# Patient Record
Sex: Female | Born: 1935 | Race: Black or African American | Hispanic: No | Smoking: Never smoker
Health system: Southern US, Community
[De-identification: ages and names within clinical notes are randomized; demographics above are authoritative.]

## PROBLEM LIST (undated history)

## (undated) DIAGNOSIS — E785 Hyperlipidemia, unspecified: Secondary | ICD-10-CM

## (undated) DIAGNOSIS — K222 Esophageal obstruction: Secondary | ICD-10-CM

## (undated) DIAGNOSIS — M1990 Unspecified osteoarthritis, unspecified site: Secondary | ICD-10-CM

## (undated) DIAGNOSIS — H409 Unspecified glaucoma: Secondary | ICD-10-CM

## (undated) DIAGNOSIS — E119 Type 2 diabetes mellitus without complications: Secondary | ICD-10-CM

## (undated) DIAGNOSIS — H04123 Dry eye syndrome of bilateral lacrimal glands: Secondary | ICD-10-CM

## (undated) DIAGNOSIS — R079 Chest pain, unspecified: Secondary | ICD-10-CM

## (undated) DIAGNOSIS — I10 Essential (primary) hypertension: Secondary | ICD-10-CM

## (undated) DIAGNOSIS — G2581 Restless legs syndrome: Secondary | ICD-10-CM

## (undated) DIAGNOSIS — E559 Vitamin D deficiency, unspecified: Secondary | ICD-10-CM

## (undated) DIAGNOSIS — D219 Benign neoplasm of connective and other soft tissue, unspecified: Secondary | ICD-10-CM

## (undated) DIAGNOSIS — B9681 Helicobacter pylori [H. pylori] as the cause of diseases classified elsewhere: Secondary | ICD-10-CM

## (undated) DIAGNOSIS — K2970 Gastritis, unspecified, without bleeding: Secondary | ICD-10-CM

## (undated) DIAGNOSIS — N189 Chronic kidney disease, unspecified: Secondary | ICD-10-CM

## (undated) HISTORY — DX: Essential (primary) hypertension: I10

## (undated) HISTORY — DX: Chronic kidney disease, unspecified: N18.9

## (undated) HISTORY — PX: TUBAL LIGATION: SHX77

## (undated) HISTORY — DX: Chest pain, unspecified: R07.9

## (undated) HISTORY — DX: Hyperlipidemia, unspecified: E78.5

## (undated) HISTORY — DX: Helicobacter pylori (H. pylori) as the cause of diseases classified elsewhere: B96.81

## (undated) HISTORY — DX: Vitamin D deficiency, unspecified: E55.9

## (undated) HISTORY — DX: Esophageal obstruction: K22.2

## (undated) HISTORY — DX: Type 2 diabetes mellitus without complications: E11.9

## (undated) HISTORY — DX: Benign neoplasm of connective and other soft tissue, unspecified: D21.9

## (undated) HISTORY — DX: Restless legs syndrome: G25.81

## (undated) HISTORY — DX: Gastritis, unspecified, without bleeding: K29.70

## (undated) HISTORY — DX: Unspecified osteoarthritis, unspecified site: M19.90

## (undated) HISTORY — PX: APPENDECTOMY: SHX54

---

## 1998-05-08 ENCOUNTER — Ambulatory Visit (HOSPITAL_COMMUNITY): Admission: RE | Admit: 1998-05-08 | Discharge: 1998-05-08 | Payer: Self-pay | Admitting: Internal Medicine

## 1998-11-07 ENCOUNTER — Ambulatory Visit (HOSPITAL_COMMUNITY): Admission: RE | Admit: 1998-11-07 | Discharge: 1998-11-07 | Payer: Self-pay | Admitting: Obstetrics & Gynecology

## 1998-11-07 ENCOUNTER — Other Ambulatory Visit: Admission: RE | Admit: 1998-11-07 | Discharge: 1998-11-07 | Payer: Self-pay | Admitting: Obstetrics & Gynecology

## 1999-05-13 ENCOUNTER — Ambulatory Visit (HOSPITAL_COMMUNITY): Admission: RE | Admit: 1999-05-13 | Discharge: 1999-05-13 | Payer: Self-pay | Admitting: Internal Medicine

## 1999-11-06 ENCOUNTER — Ambulatory Visit (HOSPITAL_COMMUNITY): Admission: RE | Admit: 1999-11-06 | Discharge: 1999-11-06 | Payer: Self-pay | Admitting: *Deleted

## 2000-05-13 ENCOUNTER — Ambulatory Visit (HOSPITAL_COMMUNITY): Admission: RE | Admit: 2000-05-13 | Discharge: 2000-05-13 | Payer: Self-pay | Admitting: Internal Medicine

## 2000-12-09 ENCOUNTER — Ambulatory Visit (HOSPITAL_COMMUNITY): Admission: RE | Admit: 2000-12-09 | Discharge: 2000-12-09 | Payer: Self-pay

## 2000-12-09 ENCOUNTER — Ambulatory Visit (HOSPITAL_COMMUNITY): Admission: RE | Admit: 2000-12-09 | Discharge: 2000-12-09 | Payer: Self-pay | Admitting: *Deleted

## 2001-05-15 ENCOUNTER — Ambulatory Visit (HOSPITAL_COMMUNITY): Admission: RE | Admit: 2001-05-15 | Discharge: 2001-05-15 | Payer: Self-pay | Admitting: Obstetrics & Gynecology

## 2001-11-10 ENCOUNTER — Ambulatory Visit (HOSPITAL_COMMUNITY): Admission: RE | Admit: 2001-11-10 | Discharge: 2001-11-10 | Payer: Self-pay | Admitting: *Deleted

## 2002-02-05 ENCOUNTER — Other Ambulatory Visit: Admission: RE | Admit: 2002-02-05 | Discharge: 2002-02-05 | Payer: Self-pay | Admitting: Internal Medicine

## 2002-05-28 ENCOUNTER — Ambulatory Visit (HOSPITAL_COMMUNITY): Admission: RE | Admit: 2002-05-28 | Discharge: 2002-05-28 | Payer: Self-pay | Admitting: Internal Medicine

## 2002-05-28 ENCOUNTER — Encounter: Payer: Self-pay | Admitting: Internal Medicine

## 2003-06-07 ENCOUNTER — Encounter: Payer: Self-pay | Admitting: Internal Medicine

## 2003-06-07 ENCOUNTER — Ambulatory Visit (HOSPITAL_COMMUNITY): Admission: RE | Admit: 2003-06-07 | Discharge: 2003-06-07 | Payer: Self-pay | Admitting: Internal Medicine

## 2004-06-11 ENCOUNTER — Ambulatory Visit (HOSPITAL_COMMUNITY): Admission: RE | Admit: 2004-06-11 | Discharge: 2004-06-11 | Payer: Self-pay | Admitting: Internal Medicine

## 2005-06-28 ENCOUNTER — Ambulatory Visit (HOSPITAL_COMMUNITY): Admission: RE | Admit: 2005-06-28 | Discharge: 2005-06-28 | Payer: Self-pay | Admitting: Internal Medicine

## 2005-12-15 ENCOUNTER — Ambulatory Visit: Payer: Self-pay | Admitting: Family Medicine

## 2005-12-27 ENCOUNTER — Ambulatory Visit: Payer: Self-pay | Admitting: Family Medicine

## 2006-01-17 ENCOUNTER — Encounter (INDEPENDENT_AMBULATORY_CARE_PROVIDER_SITE_OTHER): Payer: Self-pay | Admitting: Family Medicine

## 2006-01-17 ENCOUNTER — Ambulatory Visit: Payer: Self-pay | Admitting: Family Medicine

## 2006-01-17 LAB — CONVERTED CEMR LAB: Pap Smear: NORMAL

## 2006-01-18 ENCOUNTER — Encounter (INDEPENDENT_AMBULATORY_CARE_PROVIDER_SITE_OTHER): Payer: Self-pay | Admitting: Family Medicine

## 2006-01-18 ENCOUNTER — Ambulatory Visit (HOSPITAL_COMMUNITY): Admission: RE | Admit: 2006-01-18 | Discharge: 2006-01-18 | Payer: Self-pay | Admitting: Family Medicine

## 2006-01-21 ENCOUNTER — Ambulatory Visit (HOSPITAL_COMMUNITY): Admission: RE | Admit: 2006-01-21 | Discharge: 2006-01-21 | Payer: Self-pay | Admitting: Family Medicine

## 2006-02-14 ENCOUNTER — Ambulatory Visit: Payer: Self-pay | Admitting: Family Medicine

## 2006-02-22 ENCOUNTER — Ambulatory Visit (HOSPITAL_COMMUNITY): Admission: RE | Admit: 2006-02-22 | Discharge: 2006-02-22 | Payer: Self-pay | Admitting: Family Medicine

## 2006-02-25 ENCOUNTER — Ambulatory Visit (HOSPITAL_COMMUNITY): Admission: RE | Admit: 2006-02-25 | Discharge: 2006-02-25 | Payer: Self-pay | Admitting: Family Medicine

## 2006-03-17 ENCOUNTER — Ambulatory Visit: Payer: Self-pay | Admitting: Family Medicine

## 2006-03-25 ENCOUNTER — Ambulatory Visit: Payer: Self-pay | Admitting: Family Medicine

## 2006-04-14 ENCOUNTER — Ambulatory Visit: Payer: Self-pay | Admitting: Family Medicine

## 2006-07-01 ENCOUNTER — Ambulatory Visit: Payer: Self-pay | Admitting: Family Medicine

## 2006-07-04 ENCOUNTER — Ambulatory Visit (HOSPITAL_COMMUNITY): Admission: RE | Admit: 2006-07-04 | Discharge: 2006-07-04 | Payer: Self-pay | Admitting: Family Medicine

## 2006-07-17 ENCOUNTER — Encounter (INDEPENDENT_AMBULATORY_CARE_PROVIDER_SITE_OTHER): Payer: Self-pay | Admitting: Family Medicine

## 2006-07-28 ENCOUNTER — Ambulatory Visit: Payer: Self-pay | Admitting: Family Medicine

## 2006-07-28 LAB — CONVERTED CEMR LAB: Hgb A1c MFr Bld: 5.5 %

## 2006-08-29 HISTORY — PX: COLONOSCOPY: SHX174

## 2006-09-15 ENCOUNTER — Ambulatory Visit (HOSPITAL_COMMUNITY): Admission: RE | Admit: 2006-09-15 | Discharge: 2006-09-15 | Payer: Self-pay | Admitting: Internal Medicine

## 2006-09-15 ENCOUNTER — Ambulatory Visit: Payer: Self-pay | Admitting: Internal Medicine

## 2006-10-28 ENCOUNTER — Encounter: Payer: Self-pay | Admitting: Family Medicine

## 2006-10-28 ENCOUNTER — Ambulatory Visit: Payer: Self-pay | Admitting: Family Medicine

## 2006-10-28 ENCOUNTER — Encounter (INDEPENDENT_AMBULATORY_CARE_PROVIDER_SITE_OTHER): Payer: Self-pay | Admitting: Family Medicine

## 2006-10-28 DIAGNOSIS — D259 Leiomyoma of uterus, unspecified: Secondary | ICD-10-CM | POA: Insufficient documentation

## 2006-10-28 DIAGNOSIS — M129 Arthropathy, unspecified: Secondary | ICD-10-CM | POA: Insufficient documentation

## 2006-10-28 DIAGNOSIS — I10 Essential (primary) hypertension: Secondary | ICD-10-CM

## 2006-10-28 DIAGNOSIS — E1159 Type 2 diabetes mellitus with other circulatory complications: Secondary | ICD-10-CM | POA: Insufficient documentation

## 2006-10-28 DIAGNOSIS — K5900 Constipation, unspecified: Secondary | ICD-10-CM | POA: Insufficient documentation

## 2006-12-16 ENCOUNTER — Encounter (INDEPENDENT_AMBULATORY_CARE_PROVIDER_SITE_OTHER): Payer: Self-pay | Admitting: Family Medicine

## 2007-01-20 ENCOUNTER — Ambulatory Visit: Payer: Self-pay | Admitting: Family Medicine

## 2007-01-20 DIAGNOSIS — E119 Type 2 diabetes mellitus without complications: Secondary | ICD-10-CM | POA: Insufficient documentation

## 2007-01-20 LAB — CONVERTED CEMR LAB
Glucose, Bld: 141 mg/dL
Hgb A1c MFr Bld: 5.6 %

## 2007-01-25 ENCOUNTER — Encounter (INDEPENDENT_AMBULATORY_CARE_PROVIDER_SITE_OTHER): Payer: Self-pay | Admitting: Family Medicine

## 2007-01-26 ENCOUNTER — Encounter (INDEPENDENT_AMBULATORY_CARE_PROVIDER_SITE_OTHER): Payer: Self-pay | Admitting: Family Medicine

## 2007-02-07 LAB — CONVERTED CEMR LAB
ALT: 60 units/L — ABNORMAL HIGH (ref 0–35)
AST: 44 units/L — ABNORMAL HIGH (ref 0–37)
Alkaline Phosphatase: 71 units/L (ref 39–117)
Basophils Absolute: 0 10*3/uL (ref 0.0–0.1)
Basophils Relative: 0 % (ref 0–1)
Calcium: 9.3 mg/dL (ref 8.4–10.5)
Creatinine, Ser: 0.94 mg/dL (ref 0.40–1.20)
Eosinophils Absolute: 0.1 10*3/uL (ref 0.0–0.7)
HDL: 67 mg/dL (ref >39)
Hemoglobin: 13.3 g/dL (ref 12.0–15.0)
LDL Cholesterol: 91 mg/dL (ref 0–99)
Lymphocytes Relative: 34 % (ref 12–46)
Lymphs Abs: 2.5 10*3/uL (ref 0.7–3.3)
MCV: 98.8 fL (ref 78.0–100.0)
Microalb Creat Ratio: 9.1 mg/g (ref 0.0–30.0)
Neutro Abs: 4.2 10*3/uL (ref 1.7–7.7)
Neutrophils Relative %: 58 % (ref 43–77)
Platelets: 286 10*3/uL (ref 150–400)
Sodium: 140 meq/L (ref 135–145)
Total Bilirubin: 0.6 mg/dL (ref 0.3–1.2)
Triglycerides: 69 mg/dL (ref <150)
WBC: 7.2 10*3/uL (ref 4.0–10.5)

## 2007-02-09 ENCOUNTER — Telehealth (INDEPENDENT_AMBULATORY_CARE_PROVIDER_SITE_OTHER): Payer: Self-pay | Admitting: Family Medicine

## 2007-04-05 ENCOUNTER — Telehealth (INDEPENDENT_AMBULATORY_CARE_PROVIDER_SITE_OTHER): Payer: Self-pay | Admitting: Family Medicine

## 2007-04-19 ENCOUNTER — Telehealth (INDEPENDENT_AMBULATORY_CARE_PROVIDER_SITE_OTHER): Payer: Self-pay | Admitting: Family Medicine

## 2007-06-07 ENCOUNTER — Telehealth (INDEPENDENT_AMBULATORY_CARE_PROVIDER_SITE_OTHER): Payer: Self-pay | Admitting: Family Medicine

## 2007-07-07 ENCOUNTER — Telehealth (INDEPENDENT_AMBULATORY_CARE_PROVIDER_SITE_OTHER): Payer: Self-pay | Admitting: Family Medicine

## 2007-07-07 ENCOUNTER — Ambulatory Visit: Payer: Self-pay | Admitting: Family Medicine

## 2007-07-07 ENCOUNTER — Ambulatory Visit (HOSPITAL_COMMUNITY): Admission: RE | Admit: 2007-07-07 | Discharge: 2007-07-07 | Payer: Self-pay | Admitting: Family Medicine

## 2007-07-07 LAB — CONVERTED CEMR LAB
Cholesterol, target level: 200 mg/dL
LDL Goal: 100 mg/dL

## 2007-08-08 ENCOUNTER — Encounter (INDEPENDENT_AMBULATORY_CARE_PROVIDER_SITE_OTHER): Payer: Self-pay | Admitting: Family Medicine

## 2007-08-22 ENCOUNTER — Ambulatory Visit: Payer: Self-pay | Admitting: Family Medicine

## 2007-08-22 ENCOUNTER — Telehealth (INDEPENDENT_AMBULATORY_CARE_PROVIDER_SITE_OTHER): Payer: Self-pay | Admitting: *Deleted

## 2007-10-10 ENCOUNTER — Telehealth (INDEPENDENT_AMBULATORY_CARE_PROVIDER_SITE_OTHER): Payer: Self-pay | Admitting: *Deleted

## 2007-10-12 ENCOUNTER — Encounter (INDEPENDENT_AMBULATORY_CARE_PROVIDER_SITE_OTHER): Payer: Self-pay | Admitting: Family Medicine

## 2007-11-02 ENCOUNTER — Encounter (INDEPENDENT_AMBULATORY_CARE_PROVIDER_SITE_OTHER): Payer: Self-pay | Admitting: Family Medicine

## 2007-11-02 ENCOUNTER — Emergency Department (HOSPITAL_COMMUNITY): Admission: EM | Admit: 2007-11-02 | Discharge: 2007-11-03 | Payer: Self-pay | Admitting: Emergency Medicine

## 2007-11-02 ENCOUNTER — Telehealth (INDEPENDENT_AMBULATORY_CARE_PROVIDER_SITE_OTHER): Payer: Self-pay | Admitting: Family Medicine

## 2007-11-03 ENCOUNTER — Telehealth (INDEPENDENT_AMBULATORY_CARE_PROVIDER_SITE_OTHER): Payer: Self-pay | Admitting: *Deleted

## 2007-11-03 ENCOUNTER — Ambulatory Visit: Payer: Self-pay | Admitting: Family Medicine

## 2007-11-04 ENCOUNTER — Encounter (INDEPENDENT_AMBULATORY_CARE_PROVIDER_SITE_OTHER): Payer: Self-pay | Admitting: Family Medicine

## 2007-11-06 LAB — CONVERTED CEMR LAB: Lipase: 16 units/L (ref 0–75)

## 2007-11-09 ENCOUNTER — Ambulatory Visit (HOSPITAL_COMMUNITY): Admission: RE | Admit: 2007-11-09 | Discharge: 2007-11-09 | Payer: Self-pay | Admitting: Family Medicine

## 2007-11-09 ENCOUNTER — Ambulatory Visit: Payer: Self-pay | Admitting: Family Medicine

## 2007-11-10 ENCOUNTER — Telehealth (INDEPENDENT_AMBULATORY_CARE_PROVIDER_SITE_OTHER): Payer: Self-pay | Admitting: *Deleted

## 2007-11-29 ENCOUNTER — Ambulatory Visit: Payer: Self-pay | Admitting: Cardiology

## 2007-11-30 HISTORY — PX: CARDIOVASCULAR STRESS TEST: SHX262

## 2007-12-04 ENCOUNTER — Ambulatory Visit: Payer: Self-pay | Admitting: Family Medicine

## 2007-12-04 LAB — CONVERTED CEMR LAB
Glucose, Bld: 117 mg/dL
Hgb A1c MFr Bld: 6.4 %

## 2007-12-06 ENCOUNTER — Ambulatory Visit: Payer: Self-pay | Admitting: Cardiology

## 2007-12-08 ENCOUNTER — Encounter (HOSPITAL_COMMUNITY): Admission: RE | Admit: 2007-12-08 | Discharge: 2008-01-07 | Payer: Self-pay | Admitting: Cardiology

## 2007-12-08 ENCOUNTER — Ambulatory Visit: Payer: Self-pay | Admitting: Cardiology

## 2008-01-01 ENCOUNTER — Encounter (INDEPENDENT_AMBULATORY_CARE_PROVIDER_SITE_OTHER): Payer: Self-pay | Admitting: Family Medicine

## 2008-01-01 ENCOUNTER — Ambulatory Visit: Payer: Self-pay | Admitting: Family Medicine

## 2008-01-01 ENCOUNTER — Other Ambulatory Visit: Admission: RE | Admit: 2008-01-01 | Discharge: 2008-01-01 | Payer: Self-pay | Admitting: Internal Medicine

## 2008-01-02 ENCOUNTER — Encounter (INDEPENDENT_AMBULATORY_CARE_PROVIDER_SITE_OTHER): Payer: Self-pay | Admitting: Family Medicine

## 2008-01-02 ENCOUNTER — Telehealth (INDEPENDENT_AMBULATORY_CARE_PROVIDER_SITE_OTHER): Payer: Self-pay | Admitting: *Deleted

## 2008-01-02 LAB — CONVERTED CEMR LAB
Candida species: NEGATIVE
Trichomonal Vaginitis: NEGATIVE

## 2008-01-10 ENCOUNTER — Encounter (INDEPENDENT_AMBULATORY_CARE_PROVIDER_SITE_OTHER): Payer: Self-pay | Admitting: Family Medicine

## 2008-01-10 ENCOUNTER — Ambulatory Visit: Payer: Self-pay | Admitting: Cardiology

## 2008-02-01 ENCOUNTER — Encounter (INDEPENDENT_AMBULATORY_CARE_PROVIDER_SITE_OTHER): Payer: Self-pay | Admitting: Family Medicine

## 2008-02-05 LAB — CONVERTED CEMR LAB
Creatinine, Urine: 118.8 mg/dL
Microalb Creat Ratio: 8.6 mg/g (ref 0.0–30.0)

## 2008-02-08 ENCOUNTER — Ambulatory Visit: Payer: Self-pay | Admitting: Family Medicine

## 2008-02-08 DIAGNOSIS — E1169 Type 2 diabetes mellitus with other specified complication: Secondary | ICD-10-CM | POA: Insufficient documentation

## 2008-02-08 DIAGNOSIS — E785 Hyperlipidemia, unspecified: Secondary | ICD-10-CM | POA: Insufficient documentation

## 2008-02-15 ENCOUNTER — Encounter (INDEPENDENT_AMBULATORY_CARE_PROVIDER_SITE_OTHER): Payer: Self-pay | Admitting: Family Medicine

## 2008-03-03 IMAGING — US US ABDOMEN COMPLETE
1 series · 14 of 25 positions shown · non-contrast
Comparison: none

CLINICAL DATA: Right upper quadrant pain.
 ABDOMEN ULTRASOUND:
TECHNIQUE: Complete abdominal ultrasound examination was performed including evaluation of the liver, gallbladder, bile ducts, pancreas, kidneys, spleen, IVC, and abdominal aorta.

[Series 1: us abdomen complete · 0.32mm/px · 14 of 53 slices shown]
[im 1/53]
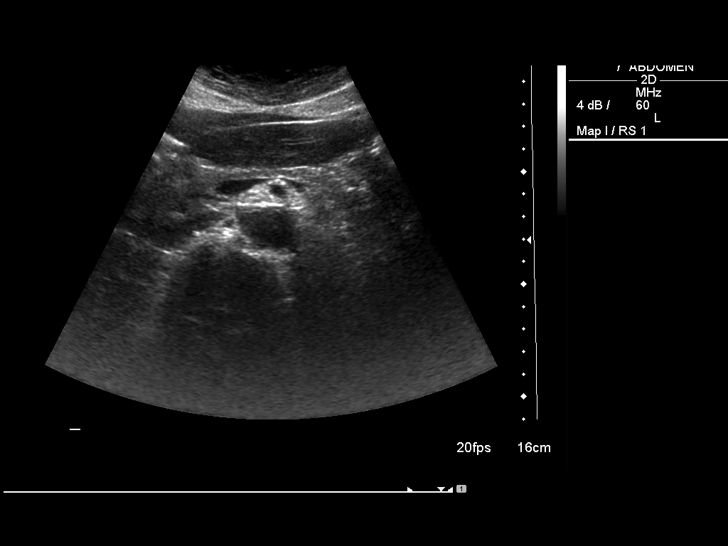
[im 5/53]
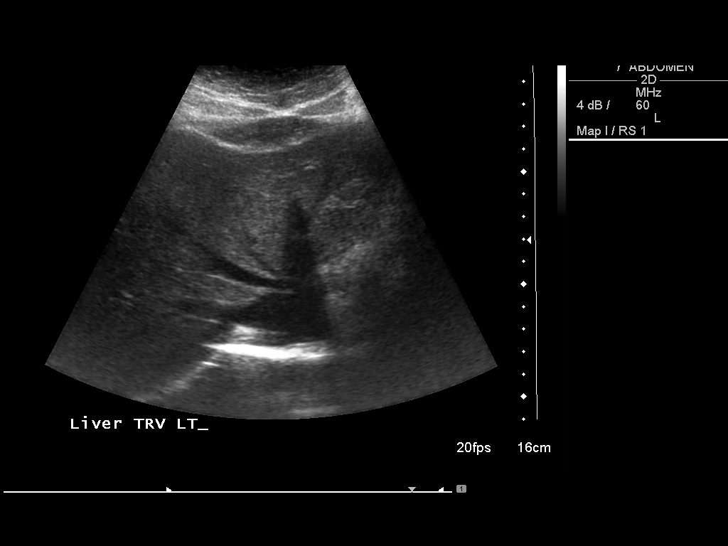
[im 9/53]
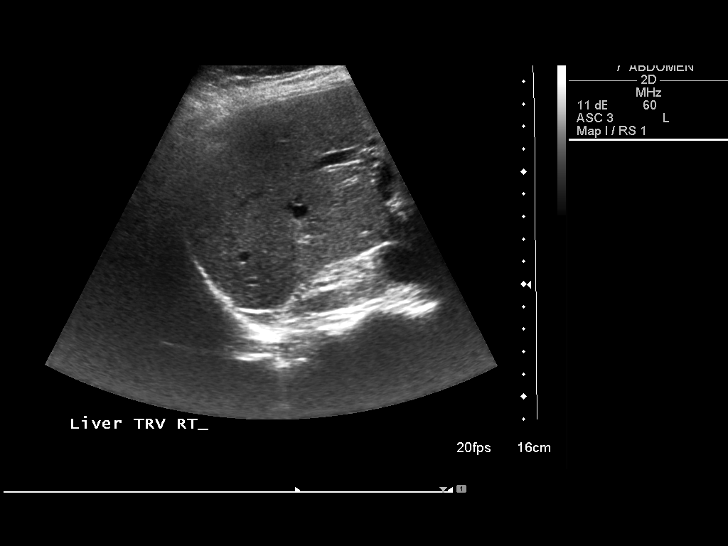
[im 14/53]
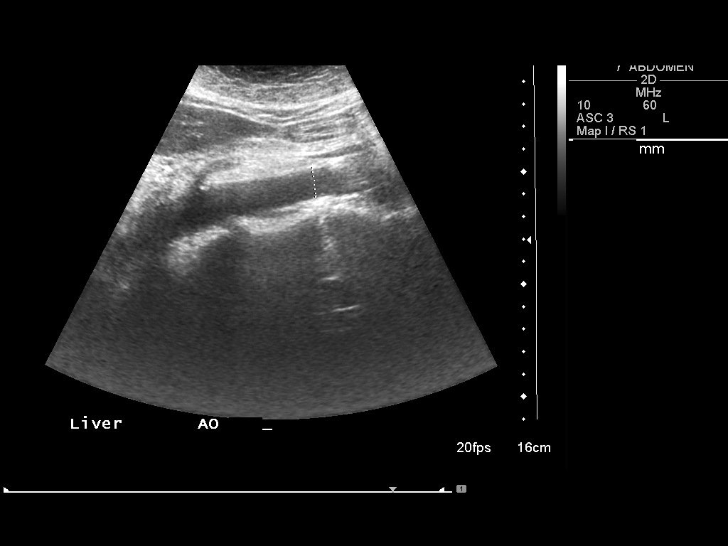
[im 18/53]
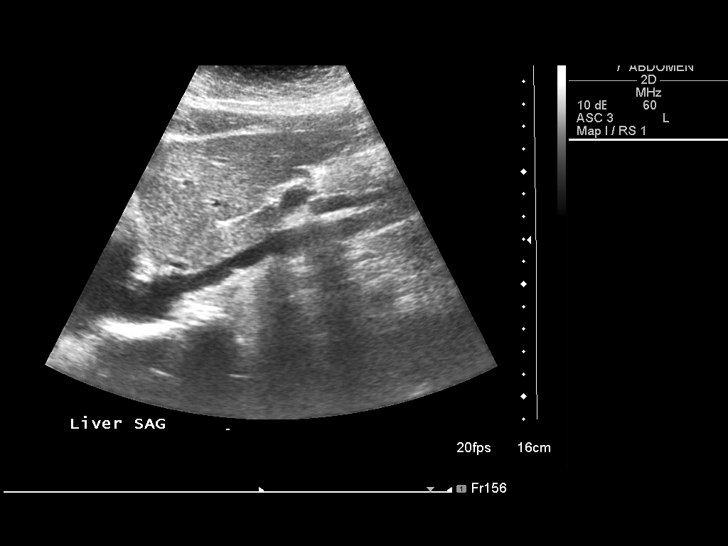
[im 20/53]
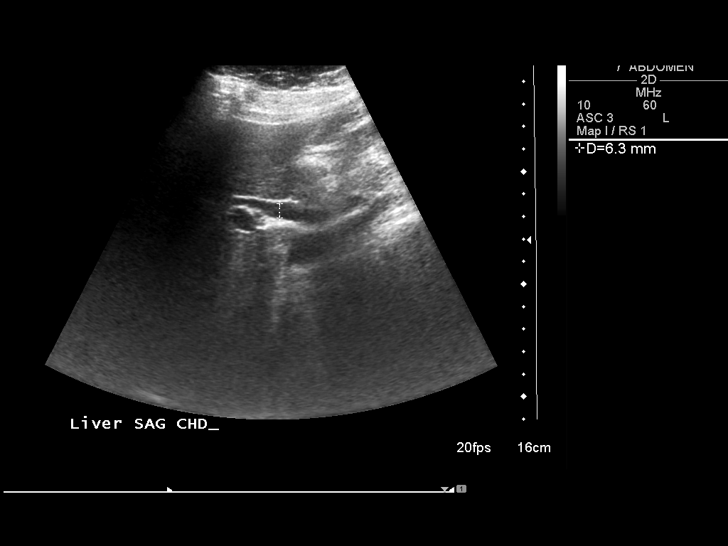
[im 24/53]
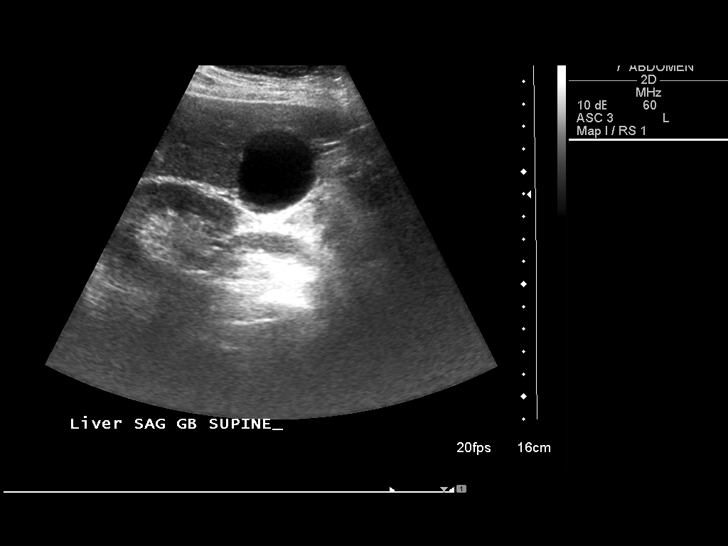
[im 29/53]
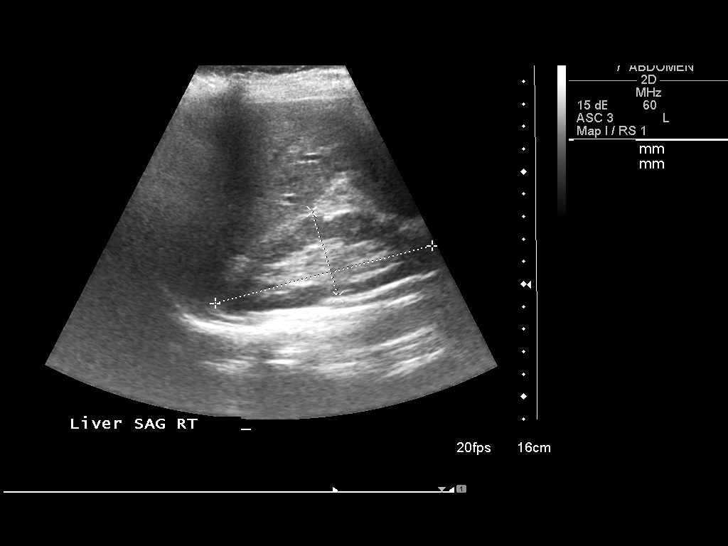
[im 33/53]
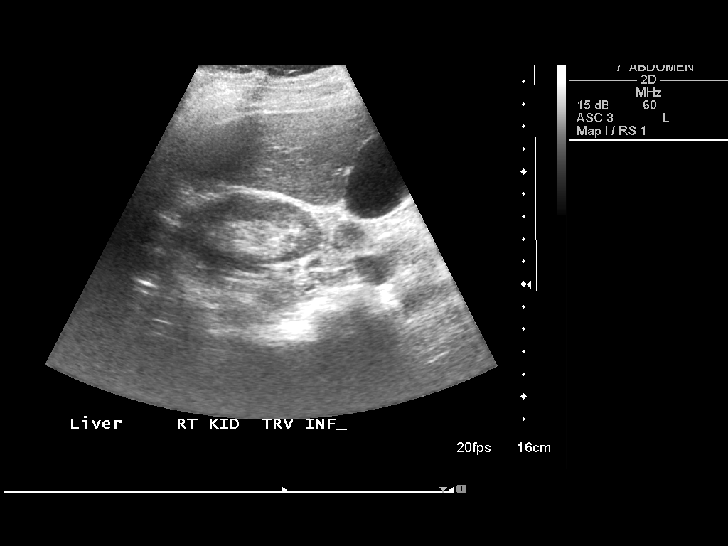
[im 35/53]
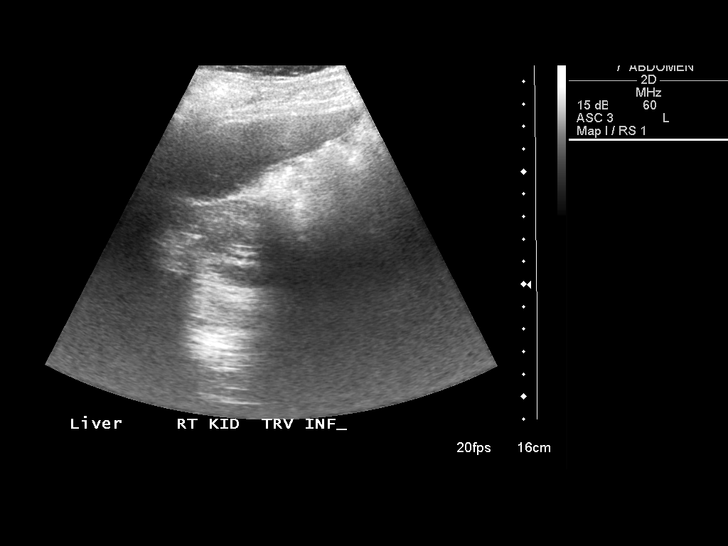
[im 40/53]
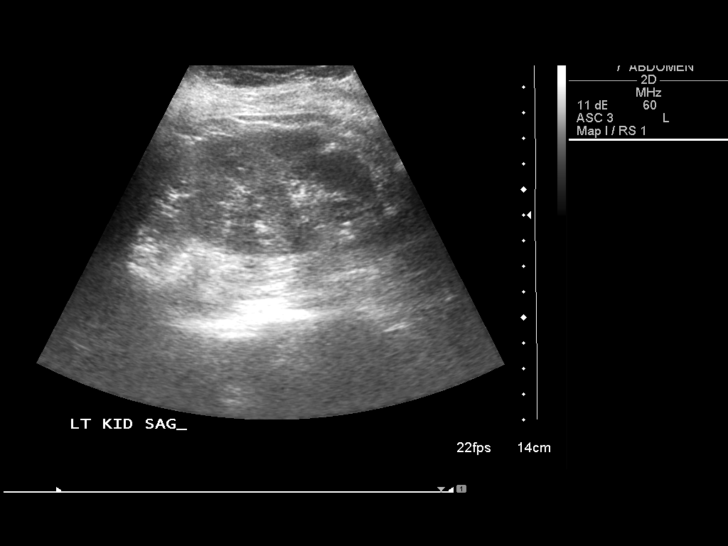
[im 44/53]
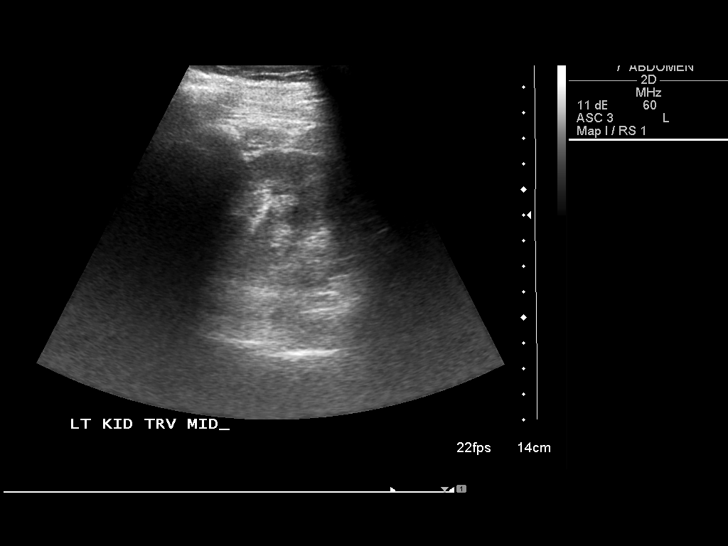
[im 48/53]
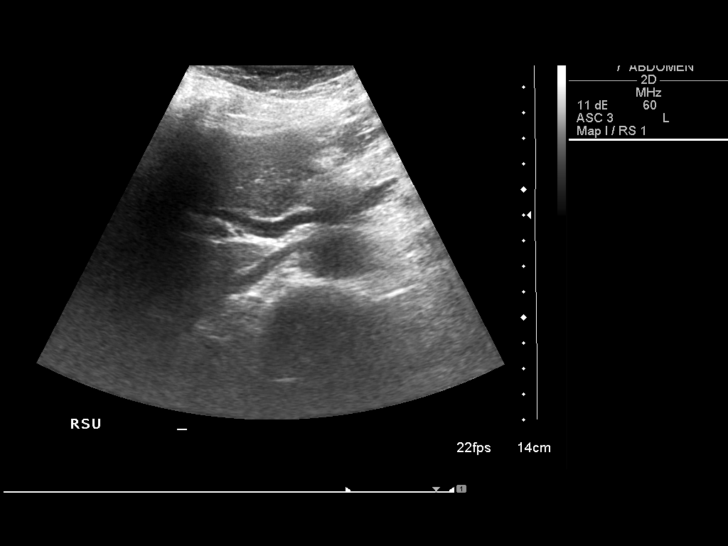
[im 53/53]
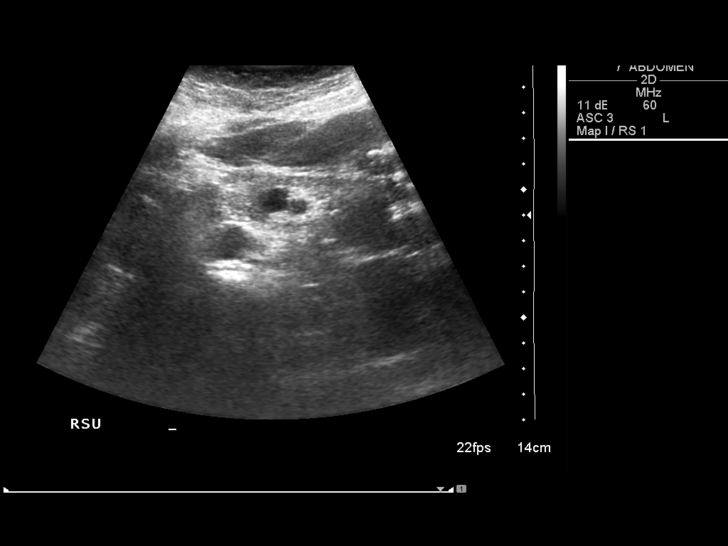

[14 of 25 positions shown; findings below may reference images not displayed]

FINDINGS: Normal gallbladder.  Gallbladder wall thickness is 3 mm.  Common duct measures 6 mm which is within normal limits.  No hepatic, pancreatic, splenic, or renal abnormality.  Spleen measures 9.0 cm in length.  Right and left kidneys measure 10.0 cm and 11.0 cm. in length, respectively.  Patent IVC.  Maximum diameter of abdominal aorta is 1.7 cm.  No abdominal mass or abnormal fluid collection.
IMPRESSION: Normal abdominal ultrasound.

## 2008-03-20 ENCOUNTER — Ambulatory Visit: Payer: Self-pay | Admitting: Family Medicine

## 2008-03-20 DIAGNOSIS — L919 Hypertrophic disorder of the skin, unspecified: Secondary | ICD-10-CM

## 2008-03-20 DIAGNOSIS — L909 Atrophic disorder of skin, unspecified: Secondary | ICD-10-CM | POA: Insufficient documentation

## 2008-03-20 LAB — CONVERTED CEMR LAB: Hgb A1c MFr Bld: 6.6 %

## 2008-04-05 ENCOUNTER — Telehealth (INDEPENDENT_AMBULATORY_CARE_PROVIDER_SITE_OTHER): Payer: Self-pay | Admitting: Family Medicine

## 2008-04-08 ENCOUNTER — Telehealth (INDEPENDENT_AMBULATORY_CARE_PROVIDER_SITE_OTHER): Payer: Self-pay | Admitting: *Deleted

## 2008-04-11 ENCOUNTER — Telehealth (INDEPENDENT_AMBULATORY_CARE_PROVIDER_SITE_OTHER): Payer: Self-pay | Admitting: Family Medicine

## 2008-05-01 ENCOUNTER — Ambulatory Visit: Payer: Self-pay | Admitting: Family Medicine

## 2008-05-01 LAB — CONVERTED CEMR LAB
Bilirubin Urine: NEGATIVE
Specific Gravity, Urine: 1.01
Urobilinogen, UA: 1
WBC Urine, dipstick: NEGATIVE
pH: 7

## 2008-06-12 ENCOUNTER — Ambulatory Visit: Payer: Self-pay | Admitting: Family Medicine

## 2008-06-12 LAB — CONVERTED CEMR LAB
Glucose, Bld: 92 mg/dL
Hgb A1c MFr Bld: 6.6 %

## 2008-06-14 ENCOUNTER — Encounter (INDEPENDENT_AMBULATORY_CARE_PROVIDER_SITE_OTHER): Payer: Self-pay | Admitting: Family Medicine

## 2008-06-27 ENCOUNTER — Encounter (INDEPENDENT_AMBULATORY_CARE_PROVIDER_SITE_OTHER): Payer: Self-pay | Admitting: Family Medicine

## 2008-06-27 ENCOUNTER — Ambulatory Visit (HOSPITAL_COMMUNITY): Admission: RE | Admit: 2008-06-27 | Discharge: 2008-06-27 | Payer: Self-pay | Admitting: Family Medicine

## 2008-06-29 HISTORY — PX: ESOPHAGOGASTRODUODENOSCOPY: SHX1529

## 2008-07-01 ENCOUNTER — Encounter (INDEPENDENT_AMBULATORY_CARE_PROVIDER_SITE_OTHER): Payer: Self-pay | Admitting: Family Medicine

## 2008-07-04 ENCOUNTER — Ambulatory Visit: Payer: Self-pay | Admitting: Internal Medicine

## 2008-07-04 ENCOUNTER — Encounter (INDEPENDENT_AMBULATORY_CARE_PROVIDER_SITE_OTHER): Payer: Self-pay | Admitting: Family Medicine

## 2008-07-08 ENCOUNTER — Ambulatory Visit: Payer: Self-pay | Admitting: Internal Medicine

## 2008-07-08 ENCOUNTER — Ambulatory Visit (HOSPITAL_COMMUNITY): Admission: RE | Admit: 2008-07-08 | Discharge: 2008-07-08 | Payer: Self-pay | Admitting: Internal Medicine

## 2008-07-10 ENCOUNTER — Ambulatory Visit (HOSPITAL_COMMUNITY): Admission: RE | Admit: 2008-07-10 | Discharge: 2008-07-10 | Payer: Self-pay | Admitting: Family Medicine

## 2008-08-07 ENCOUNTER — Encounter (INDEPENDENT_AMBULATORY_CARE_PROVIDER_SITE_OTHER): Payer: Self-pay | Admitting: Family Medicine

## 2008-09-17 ENCOUNTER — Ambulatory Visit: Payer: Self-pay | Admitting: Family Medicine

## 2008-09-17 LAB — CONVERTED CEMR LAB: Hgb A1c MFr Bld: 6.2 %

## 2008-09-25 ENCOUNTER — Encounter (INDEPENDENT_AMBULATORY_CARE_PROVIDER_SITE_OTHER): Payer: Self-pay | Admitting: Family Medicine

## 2008-09-26 ENCOUNTER — Encounter (INDEPENDENT_AMBULATORY_CARE_PROVIDER_SITE_OTHER): Payer: Self-pay | Admitting: Family Medicine

## 2008-09-27 LAB — CONVERTED CEMR LAB
Albumin: 4.3 g/dL (ref 3.5–5.2)
BUN: 15 mg/dL (ref 6–23)
CO2: 26 meq/L (ref 19–32)
Calcium: 9.6 mg/dL (ref 8.4–10.5)
Chloride: 105 meq/L (ref 96–112)
Cholesterol: 210 mg/dL — ABNORMAL HIGH (ref 0–200)
Creatinine, Ser: 0.93 mg/dL (ref 0.40–1.20)
HDL: 84 mg/dL (ref >39)
Potassium: 4.8 meq/L (ref 3.5–5.3)
Total CHOL/HDL Ratio: 2.5

## 2008-10-10 ENCOUNTER — Telehealth (INDEPENDENT_AMBULATORY_CARE_PROVIDER_SITE_OTHER): Payer: Self-pay | Admitting: *Deleted

## 2008-10-28 ENCOUNTER — Telehealth (INDEPENDENT_AMBULATORY_CARE_PROVIDER_SITE_OTHER): Payer: Self-pay | Admitting: *Deleted

## 2008-11-29 HISTORY — PX: CATARACT EXTRACTION: SUR2

## 2008-12-18 ENCOUNTER — Ambulatory Visit: Payer: Self-pay | Admitting: Family Medicine

## 2008-12-18 LAB — CONVERTED CEMR LAB
Glucose, Bld: 118 mg/dL
Hgb A1c MFr Bld: 6.4 %

## 2008-12-25 ENCOUNTER — Encounter (INDEPENDENT_AMBULATORY_CARE_PROVIDER_SITE_OTHER): Payer: Self-pay | Admitting: Family Medicine

## 2008-12-26 LAB — CONVERTED CEMR LAB
ALT: 27 units/L (ref 0–35)
AST: 23 units/L (ref 0–37)
Albumin: 4.2 g/dL (ref 3.5–5.2)
CO2: 25 meq/L (ref 19–32)
Calcium: 9.4 mg/dL (ref 8.4–10.5)
Chloride: 105 meq/L (ref 96–112)
Cholesterol: 200 mg/dL (ref 0–200)
Creatinine, Ser: 0.92 mg/dL (ref 0.40–1.20)
Potassium: 4.7 meq/L (ref 3.5–5.3)
Total CHOL/HDL Ratio: 2.4

## 2009-01-09 ENCOUNTER — Ambulatory Visit: Payer: Self-pay | Admitting: Family Medicine

## 2009-03-19 ENCOUNTER — Ambulatory Visit: Payer: Self-pay | Admitting: Family Medicine

## 2009-03-19 LAB — CONVERTED CEMR LAB
Glucose, Bld: 139 mg/dL
Hgb A1c MFr Bld: 6.9 %

## 2009-03-20 ENCOUNTER — Encounter (INDEPENDENT_AMBULATORY_CARE_PROVIDER_SITE_OTHER): Payer: Self-pay | Admitting: Family Medicine

## 2009-03-20 LAB — CONVERTED CEMR LAB
BUN: 17 mg/dL (ref 6–23)
Basophils Absolute: 0 10*3/uL (ref 0.0–0.1)
Chloride: 106 meq/L (ref 96–112)
Creatinine, Urine: 84.3 mg/dL
Eosinophils Relative: 1 % (ref 0–5)
Lymphocytes Relative: 30 % (ref 12–46)
Lymphs Abs: 1.9 10*3/uL (ref 0.7–4.0)
Microalb Creat Ratio: 10.1 mg/g (ref 0.0–30.0)
Microalb, Ur: 0.85 mg/dL (ref 0.00–1.89)
Neutro Abs: 4 10*3/uL (ref 1.7–7.7)
Neutrophils Relative %: 62 % (ref 43–77)
Platelets: 239 10*3/uL (ref 150–400)
Potassium: 4.3 meq/L (ref 3.5–5.3)
RDW: 13 % (ref 11.5–15.5)
Sodium: 139 meq/L (ref 135–145)
TSH: 0.771 microintl units/mL (ref 0.350–4.500)
WBC: 6.3 10*3/uL (ref 4.0–10.5)

## 2009-03-31 ENCOUNTER — Encounter (INDEPENDENT_AMBULATORY_CARE_PROVIDER_SITE_OTHER): Payer: Self-pay | Admitting: Family Medicine

## 2009-06-20 ENCOUNTER — Ambulatory Visit: Payer: Self-pay | Admitting: Family Medicine

## 2009-06-20 DIAGNOSIS — L84 Corns and callosities: Secondary | ICD-10-CM | POA: Insufficient documentation

## 2009-06-20 DIAGNOSIS — H269 Unspecified cataract: Secondary | ICD-10-CM | POA: Insufficient documentation

## 2009-06-20 LAB — CONVERTED CEMR LAB
Glucose, Bld: 120 mg/dL
Hgb A1c MFr Bld: 6.6 %

## 2009-07-01 LAB — CONVERTED CEMR LAB
ALT: 30 units/L (ref 0–35)
AST: 23 units/L (ref 0–37)
Albumin: 4.4 g/dL (ref 3.5–5.2)
Alkaline Phosphatase: 74 units/L (ref 39–117)
BUN: 18 mg/dL (ref 6–23)
HDL: 88 mg/dL (ref >39)
LDL Cholesterol: 101 mg/dL — ABNORMAL HIGH (ref 0–99)
Potassium: 4.6 meq/L (ref 3.5–5.3)
Total CHOL/HDL Ratio: 2.3

## 2009-08-08 ENCOUNTER — Ambulatory Visit: Payer: Self-pay | Admitting: Family Medicine

## 2009-08-08 ENCOUNTER — Ambulatory Visit (HOSPITAL_COMMUNITY): Admission: RE | Admit: 2009-08-08 | Discharge: 2009-08-08 | Payer: Self-pay | Admitting: Family Medicine

## 2009-08-11 ENCOUNTER — Telehealth (INDEPENDENT_AMBULATORY_CARE_PROVIDER_SITE_OTHER): Payer: Self-pay | Admitting: Family Medicine

## 2009-08-13 ENCOUNTER — Telehealth (INDEPENDENT_AMBULATORY_CARE_PROVIDER_SITE_OTHER): Payer: Self-pay | Admitting: *Deleted

## 2010-08-10 ENCOUNTER — Ambulatory Visit (HOSPITAL_COMMUNITY): Admission: RE | Admit: 2010-08-10 | Discharge: 2010-08-10 | Payer: Self-pay | Admitting: Obstetrics and Gynecology

## 2010-11-29 DIAGNOSIS — B9681 Helicobacter pylori [H. pylori] as the cause of diseases classified elsewhere: Secondary | ICD-10-CM

## 2010-11-29 HISTORY — DX: Helicobacter pylori (H. pylori) as the cause of diseases classified elsewhere: B96.81

## 2010-11-29 HISTORY — PX: CATARACT EXTRACTION: SUR2

## 2010-12-20 ENCOUNTER — Encounter: Payer: Self-pay | Admitting: Family Medicine

## 2011-04-13 NOTE — Op Note (Signed)
NAMECARLY, Candice Brewer                ACCOUNT NO.:  [id number]   MEDICAL RECORD NO.:  [id number]          PATIENT TYPE:  AMB   LOCATION:  DAY                           FACILITY:  [facility]   PHYSICIAN:  R. Roetta Sessions, M.D. DATE OF BIRTH:  [birth year]   DATE OF PROCEDURE:  07/08/2008  DATE OF DISCHARGE:  07/08/2008                               OPERATIVE REPORT   EGD with Elease Hashimoto dilation.   INDICATIONS FOR PROCEDURE:  A 76-year lady with a 1-year history of  esophageal dysphagia.  EGD now being performed.  Potential risks,  benefits, and alternatives have been reviewed.  Potential for esophageal  dilation reviewed.  All parties agreeable.   PROCEDURE NOTE:  O2 saturation, blood pressure, pulse, and respirations  were monitored throughout the entire procedure.   CONSCIOUS SEDATION:  Versed 4 mg IV, Demerol 75 mg IV in divided doses.  Cetacaine spray for topical pharyngeal anesthesia.   FINDINGS:  Examination of the tubular esophagus revealed a Schatzki's  ring and also distal esophageal erosions with no Barrett esophagus  evidence of neoplasia.  EG junction easily traversed with the scope.  Stomach:  Gastric cavity was empty.  It insufflated well with air.  Thorough examination of the gastric mucosa including retroflexed view of  the proximal stomach, esophagogastric junction demonstrated a small  hiatal hernia only.  Pylorus was patent and easily traversed.  Examination of the bulb second portion revealed no abnormalities.  Therapeutic/diagnostic maneuvers performed:  A 54-French Maloney  dilators passed with ease with no resistance.  Subsequently, 56-French  [facility] dilator was passed with minimal resistance upon full insertion.  A look back revealed the ring remained intact.  Subsequently, the rim  was disrupted with 4 quadrant bites of biopsy forceps.  This was done  with effectively without apparent complication.  The patient tolerated  the procedure well as reactive endoscopy.   IMPRESSION:  1. Schatzki's ring status post dilation disruption described above the      distal esophageal erosions consistent with erosive reflux      esophagitis.  2. Hiatal hernia; otherwise, normal stomach.  D1 and D2.   RECOMMENDATIONS:  1. Prilosec 20 mg orally daily.  2. Follow up appointment with [place] in 6 months as she is doing.      Jonathon Bellows, M.D.  Electronically Signed     RMR/MEDQ  D:  07/29/2008  T:  07/30/2008  Job:  [id number]   cc:   Franchot Heidelberg, M.D.

## 2011-04-13 NOTE — Consult Note (Signed)
NAMEKATHARINE, ROCHEFORT                ACCOUNT NO.:  [id number]   MEDICAL RECORD NO.:  [id number]          PATIENT TYPE:  AMB   LOCATION:  DAY                           FACILITY:  [facility]   PHYSICIAN:  R. Roetta Sessions, M.D. DATE OF BIRTH:  January 28, 1932   DATE OF CONSULTATION:  DATE OF DISCHARGE:                                 CONSULTATION   REQUESTING PHYSICIAN:  Franchot Heidelberg, M.D.   REASON FOR CONSULTATION:  Dysphagia.   HISTORY OF PRESENT ILLNESS:  Ms. Santacroce is a 76-year0ld African-American  female.  She has had for more than a year a sensation of having to  swallow several times before her food goes down.  She tells me, at  times, she feels as though she has to flush it down with water.  She  denies any odynophagia.  She feels that most of her food gets stuck and  point to her upper esophagus.  She was previously evaluated in the  emergency room and underwent a cardiac workup, which included a stress  Myoview earlier this year.  It was normal.  She tells me occasionally  with liquids, she does get strangled as well.  She denies any  regurgitation.  Denies any nausea, vomiting, anorexia, or early satiety.  Her weight has remained stable.  Denies any heartburn or indigestion.  She sounds somewhat hoarse to me, but denies any problems with frequent  throat clearing or chronic hoarseness.  She denies any rectal bleeding  or melena.  She is having normal soft brown daily bowel movements.   PAST MEDICAL AND SURGICAL HISTORY:  Diabetes mellitus and hypertension.  She has history of eye problems and is on medicines for glaucoma  prevention and a chronic eye infection?  She is status post appendectomy  and tubal ligation.  She had a normal screening colonoscopy by Dr. Jena Gauss  on September 15, 2006.  Uterine fibroids, restless legs syndrome, and  arthritis.   CURRENT MEDICATIONS:  1. Acyclovir eye drops 800 mg b.i.d.  2. Omnipred eye drops as directed.  3. Refresh and __________ eye  drops as directed.  4. Clonidine 0.1 mg b.i.d.  5. Glipizide 5 mg daily.  6. Enalapril 20 mg daily.  7. Aspirin 81 mg daily.  8. __________ eye drops daily.  9. Multivitamin daily.   ALLERGIES:  NEOSPORIN causes swelling.   FAMILY HISTORY:  Positive for father diagnosed with colon cancer at age  29.  He died 5 years later.  Mother deceased secondary to old age.  She  has three siblings with history significant for coronary artery disease  and diabetes mellitus.   SOCIAL HISTORY:  Ms. Chaplin is divorced.  She lives alone.  She has five  healthy children.  She is retired from [employer].  She is a Agricultural consultant at  [employer].  She has a remote history of tobacco use.  Denies any illicit drug use.  She does occasionally consume a glass of  wine once or twice a month.   REVIEW OF SYSTEMS:  See HPI, otherwise negative.   PHYSICAL EXAMINATION:  VITAL SIGNS:  Weight 166 pounds, height 67  inches, temperature 98, blood pressure 120/70, and pulse 72.  GENERAL:  She is a well-developed, well-nourished African-American  female in no acute distress.  She is alert, oriented, pleasant, and  cooperative.  HEENT:  Sclerae clear and nonicteric.  Conjunctivae are pink.  Oropharynx pink and moist without any lesions.  NECK:  Supple without any masses or thyromegaly.  CHEST:  Heart, regular rate and rhythm.  Normal S1 and S2 without  murmurs, clicks, rubs, or gallops.  LUNGS:  Clear to auscultation bilaterally.  ABDOMEN:  Positive bowel sounds x4.  No bruits auscultated.  Soft,  nontender, and nondistended without palpable mass or  hepatosplenomegaly.  No rebound tenderness or guarding.  EXTREMITIES:  Without clubbing or edema bilaterally.  SKIN:  Warm and dry without any rash or jaundice.   IMPRESSION:  Ms. Vanecek is a 77 year old female with a greater than 1-  year history of dysphagia with both solids and liquids.  It is possible  given the location that she may have an oropharyngeal  component;  however, we need to rule out esophageal dysphagia as well due to  esophageal web, ring, or stricture, or occult malignancy.   She had family history of colon cancer.   PLAN:  1. Colonoscopy in 2012.  2. EGD with possible esophageal dilatation with Dr. Jena Gauss in the near      future.  I discussed procedure including risks and benefits      including, but not limited to, bleeding, infection, perforation,      and drug reaction.  She agrees.  Informed consent will be obtained.      No aspirin 4 days prior to the procedure.   Thank you Dr. Erby Pian for allowing [place] to participate in the care of Ms.  Coralee Rud.      Lorenza Burton, N.P.      Jonathon Bellows, M.D.  Electronically Signed    KJ/MEDQ  D:  07/04/2008  T:  07/05/2008  Job:  [id number]   cc:   Franchot Heidelberg, M.D.

## 2011-04-13 NOTE — Assessment & Plan Note (Signed)
[facility]                       [place] CARDIOLOGY OFFICE NOTE   CHARLESTON, VIERLING                       MRN:          [id number]  DATE:11/29/2007                            DOB:          1932-02-21    REFERRING PHYSICIAN:  Franchot Heidelberg, M.D.   It was my pleasure evaluating Candice Brewer in the office today in  consultation for chest discomfort.  As you know, this nice woman has  enjoyed generally good health although she does have mild diabetes and  hypertension.  She is postmenopausal.  She participated in the [facility] initiative for 12 years, but apparently was taking placebo for  all three arms.  Other than testing associated with that protocol, she  has never undergone any cardiac testing and has never seen a  cardiologist.   She was recently seen in the emergency department for chest discomfort  after calling your office to report that she was experiencing those  symptoms.  Her description to me is somewhat different than to the  emergency room physician.  She apparently had episodic left costal  margin and lower substernal discomfort that was mild to moderate in  severity.  It is not clear whether it was lancinating or whether there  was some duration to the episodes.  There was no associated dyspnea,  diaphoresis nor nausea.  The onset of symptoms was at rest.  There was  no radiation.  She has felt well over recent days.   Evaluation in the emergency department was unrevealing.  She had normal  CBC, normal chemistry profile except for mildly elevated glucose and  normal cardiac markers.  EKG and chest x-ray were reportedly  unremarkable.   The patient does not smoke cigarettes.  Other past medical history is  essentially negative.  She has undergone tubal ligation and  appendectomy.  Other than for these procedures, her only hospitalization  was remotely for child birth.   ALLERGIES:  She reports an allergy to  NEOSPORIN.   CURRENT MEDICATIONS:  Current medications include clonidine 0.1 mg  b.i.d., aspirin 81 mg daily, enalapril 20 mg daily and glipizide 10 mg  daily.   SOCIAL HISTORY:  Retired; not terribly active, although she does some  walking.  She is divorced with six children.   FAMILY HISTORY:  Father died due to carcinoma of the prostate; mother  died at age 75 of unknown causes.  She has three siblings, one of whom  has cardiac problems.   REVIEW OF SYSTEMS:  Is notable for occasional headaches, the need for  corrective lenses, partial upper dentures, arthritic discomfort in her  hands.  All other systems reviewed and are negative.   PHYSICAL EXAMINATION:  GENERAL:  On exam a pleasant, trim, useful  appearing woman.  VITAL SIGNS:  The weight is 163, blood pressure 105/60, heart rate 70  and regular, respirations 18.  HEENT:  EOMs full; normal oral mucosa.  NECK:  No jugular venous distention; normal carotid upstrokes without  bruits.  ENDOCRINE:  No thyromegaly.  HEMATOPOIETIC:  No adenopathy.  SKIN:  No significant lesions.  LUNGS:  Clear.  CARDIOVASCULAR:  Normal first and second heart sounds; modest systolic  ejection murmur.  ABDOMEN:  Soft and nontender; no organomegaly.  EXTREMITIES:  Normal distal pulses; no edema.  NEUROLOGIC:  Symmetric strength and tone; normal cranial nerves.   EKG:  Normal sinus rhythm; delayed R-wave progression - cannot exclude  prior septal myocardial infarction; borderline first-degree AV block;  otherwise unremarkable, when compared to a prior tracing from your  office, R-wave progression was previously normal.   IMPRESSION:  [name] has chest discomfort that ranges somewhere  between atypical and noncardiac.  I was initially unimpressed by her  symptoms, but the description from the emergency department is more  worrisome.  We will proceed with a stress nuclear study.  If negative, I  do not believe that additional evaluation will be  necessary.  If we  cannot locate a lipid profile - one will be obtained.  Most likely, in  the setting of multiple cardiovascular risk factors including diabetes,  treatment with a statin will be warranted.  Thanks so much for sending  this very nice lady to see me.     Gerrit Friends. Dietrich Pates, MD, [facility]  Electronically Signed    RMR/MedQ  DD: 11/29/2007  DT: 11/29/2007  Job #: [id number]   cc:   Franchot Heidelberg, M.D.

## 2011-04-13 NOTE — Letter (Signed)
January 10, 2008    Franchot Heidelberg, M.D.  [address], [address]  [place], [place] [address]   RE:  LILLIANN, ROSSETTI  MRN:  [id number]  /  DOB:  1932-01-28   Dear Remi Haggard:   Ms. Trahan returns to the office for continued assessment and treatment  of chest discomfort.  Since her last visit, she has continued to have  symptoms of retrosternal discomfort.  These are relatively infrequent  and relatively mild.  She also now describes some dysphasia.  If she  eats too quickly, she has a sensation of food sticking retrosternally.  She has had no associated symptoms with her chest discomfort.   Diabetes remains mild.  She typically has CBG at home less than 130.  Her dose of glipizide has been reduced to 5 mg daily with the promise  that she may be off of that medicine entirely at some point in the  future.  Her other medications are unchanged.   EXAM:  Pleasant woman in no acute distress.  The weight is 166, 3 pounds more than in December.  Blood pressure  110/65, heart rate 68 and regular, respirations 18.  NECK:  No jugular venous distention; normal carotid upstrokes without  bruits.  LUNGS:  Clear.  CARDIAC:  Normal first and second heart sounds; minimal systolic  ejection murmur.  ABDOMEN:  Soft and nontender; no organomegaly.  EXTREMITIES:  No edema.   A stress nuclear study was performed.  She had relatively good exercise  tolerance without evidence for ischemia or infarction.  Left ventricular  systolic function was normal.   IMPRESSION:  Ms. Hebb appears to have chest discomfort of  gastrointestinal origin.  We will treat her empirically with over-the-  counter Prilosec 20 mg daily.  Blood pressure is well-controlled.  A  lipid profile in January was relatively good with total cholesterol of  201, HDL of 78, and LDL of 110.  Nonetheless, since she is diabetic,  treatment with a statin is appropriate.  I would recommend simvastatin  20 mg daily, but  will leave choice of drug to your discretion.   Since she is having minor dysphasia, I would recommend referral to a  gastroenterologist for additional evaluation and upper endoscopy.  Please let me know at any time the future that I can assist in the care  of this nice woman.    Sincerely,      Gerrit Friends. Dietrich Pates, MD, [facility]  Electronically Signed    RMR/MedQ  DD: 01/10/2008  DT: 01/11/2008  Job #: [id number]

## 2011-04-16 NOTE — Op Note (Signed)
NAME:  Candice Brewer, Candice Brewer                ACCOUNT NO.:  [id number]   MEDICAL RECORD NO.:  [id number]          PATIENT TYPE:  AMB   LOCATION:  DAY                           FACILITY:  [facility]   PHYSICIAN:  R. Roetta Sessions, M.D. DATE OF BIRTH:  01/29/1932   DATE OF PROCEDURE:  09/15/2006  DATE OF DISCHARGE:                                 OPERATIVE REPORT   PROCEDURE:  Screening colonoscopy.   INDICATIONS FOR PROCEDURE:  The patient is a 77 year old lady with a  positive family history of colon cancer in her father who was diagnosed at  age 34.  She has never had her lower GI tract imaged.  She has no lower GI  tract symptoms.  Colonoscopy is now being done as a high risk screening  maneuver.  She has been kindly referred over at the courtesy of Dr.  Erby Pian.   PROCEDURE NOTE:  O2 saturation, blood pressure, pulse and respirations were  monitor throughout the entire procedure.   CONSCIOUS SEDATION:  Versed 4 mg IV, Demerol 75 mg IV in divided doses.   INSTRUMENT:  Olympus video chip system.   FINDINGS:  Digital rectal exam revealed no abnormalities.   ENDOSCOPIC FINDINGS:  The prep was good.   RECTUM:  Examination of the rectal mucosa including a retroflexed view of  the anal verge revealed no abnormalities.   COLON:  The colonic mucosa was surveyed from the rectosigmoid junction to  the left transverse and right colon to area of the appendiceal orifice,  ileocecal valve and cecum.  These structures were well seen and photographed  for the record. The terminal ileum was intubated 5 cm from this level, scope  was slowly and cautiously withdrawn. All previously mentioned mucosal  surfaces were again seen.  The colonic mucosa appeared normal.  The patient  tolerated the procedure well and was reacted in endoscopy.   IMPRESSION:  1. Normal rectum.  2. Normal colon.   RECOMMENDATIONS:  Repeat screening colonoscopy in 5 years.      Jonathon Bellows, M.D.  Electronically  Signed     RMR/MEDQ  D:  09/15/2006  T:  09/16/2006  Job:  [id number]   cc:   Franchot Heidelberg, MD

## 2011-04-23 ENCOUNTER — Emergency Department (HOSPITAL_COMMUNITY): Payer: Medicare Other

## 2011-04-23 ENCOUNTER — Emergency Department (HOSPITAL_COMMUNITY)
Admission: EM | Admit: 2011-04-23 | Discharge: 2011-04-23 | Disposition: A | Discharge status: Home / Self Care | Payer: Medicare Other | Attending: Emergency Medicine | Admitting: Emergency Medicine

## 2011-04-23 DIAGNOSIS — R55 Syncope and collapse: Secondary | ICD-10-CM | POA: Insufficient documentation

## 2011-04-23 DIAGNOSIS — Z7982 Long term (current) use of aspirin: Secondary | ICD-10-CM | POA: Insufficient documentation

## 2011-04-23 DIAGNOSIS — I10 Essential (primary) hypertension: Secondary | ICD-10-CM | POA: Insufficient documentation

## 2011-04-23 DIAGNOSIS — Z79899 Other long term (current) drug therapy: Secondary | ICD-10-CM | POA: Insufficient documentation

## 2011-04-23 DIAGNOSIS — N390 Urinary tract infection, site not specified: Secondary | ICD-10-CM | POA: Insufficient documentation

## 2011-04-23 DIAGNOSIS — E119 Type 2 diabetes mellitus without complications: Secondary | ICD-10-CM | POA: Insufficient documentation

## 2011-04-23 LAB — URINALYSIS, ROUTINE W REFLEX MICROSCOPIC
Glucose, UA: NEGATIVE mg/dL
Ketones, ur: NEGATIVE mg/dL
Nitrite: NEGATIVE
Protein, ur: NEGATIVE mg/dL

## 2011-04-23 LAB — CBC
Hemoglobin: 12.6 g/dL (ref 12.0–15.0)
MCH: 33.2 pg (ref 26.0–34.0)
MCV: 99.2 fL (ref 78.0–100.0)
RBC: 3.8 MIL/uL — ABNORMAL LOW (ref 3.87–5.11)
WBC: 8.5 10*3/uL (ref 4.0–10.5)

## 2011-04-23 LAB — DIFFERENTIAL
Lymphs Abs: 1.9 10*3/uL (ref 0.7–4.0)
Monocytes Relative: 4 % (ref 3–12)
Neutro Abs: 6.2 10*3/uL (ref 1.7–7.7)
Neutrophils Relative %: 73 % (ref 43–77)

## 2011-04-23 LAB — URINE MICROSCOPIC-ADD ON

## 2011-04-23 LAB — BASIC METABOLIC PANEL
BUN: 20 mg/dL (ref 6–23)
Chloride: 99 mEq/L (ref 96–112)
Creatinine, Ser: 1 mg/dL (ref 0.4–1.2)
GFR calc Af Amer: >60 mL/min (ref >60)
GFR calc non Af Amer: 54 mL/min — ABNORMAL LOW (ref >60)
Potassium: 4 mEq/L (ref 3.5–5.1)

## 2011-04-23 LAB — CK TOTAL AND CKMB (NOT AT ARMC)
Relative Index: 3.4 — ABNORMAL HIGH (ref 0.0–2.5)
Total CK: 103 U/L (ref 7–177)

## 2011-04-23 LAB — TROPONIN I: Troponin I: <0.3 ng/mL (ref <0.30)

## 2011-04-26 LAB — URINE CULTURE
Colony Count: 15000
Culture  Setup Time: 201205260222

## 2011-07-12 ENCOUNTER — Other Ambulatory Visit (HOSPITAL_COMMUNITY): Payer: Self-pay | Admitting: Family Medicine

## 2011-07-12 DIAGNOSIS — Z1231 Encounter for screening mammogram for malignant neoplasm of breast: Secondary | ICD-10-CM

## 2011-07-12 DIAGNOSIS — Z139 Encounter for screening, unspecified: Secondary | ICD-10-CM

## 2011-08-12 ENCOUNTER — Ambulatory Visit (HOSPITAL_COMMUNITY): Payer: Medicare Other

## 2011-08-16 ENCOUNTER — Ambulatory Visit (HOSPITAL_COMMUNITY)
Admission: RE | Admit: 2011-08-16 | Discharge: 2011-08-16 | Disposition: A | Discharge status: Home / Self Care | Payer: Medicare Other | Source: Ambulatory Visit | Attending: Family Medicine | Admitting: Family Medicine

## 2011-08-16 DIAGNOSIS — Z139 Encounter for screening, unspecified: Secondary | ICD-10-CM

## 2011-08-16 DIAGNOSIS — Z1231 Encounter for screening mammogram for malignant neoplasm of breast: Secondary | ICD-10-CM | POA: Insufficient documentation

## 2011-08-17 ENCOUNTER — Encounter: Payer: Self-pay | Admitting: Internal Medicine

## 2011-09-06 LAB — POCT CARDIAC MARKERS
CKMB, poc: 1.5
CKMB, poc: 1.7
Myoglobin, poc: 60
Myoglobin, poc: 66.4
Operator id: 198161
Operator id: 215201

## 2011-09-06 LAB — COMPREHENSIVE METABOLIC PANEL
ALT: 22
AST: 22
Albumin: 3.7
Alkaline Phosphatase: 73
BUN: 20
Chloride: 107
GFR calc Af Amer: >60
Potassium: 3.9
Sodium: 140
Total Bilirubin: 0.5
Total Protein: 7.1

## 2011-09-06 LAB — DIFFERENTIAL
Basophils Absolute: 0
Basophils Relative: 0
Eosinophils Relative: 2
Monocytes Absolute: 0.6
Monocytes Relative: 7
Neutro Abs: 5

## 2011-09-06 LAB — CBC
HCT: 38.2
Platelets: 213
RDW: 13.2
WBC: 8.1

## 2011-09-16 ENCOUNTER — Ambulatory Visit (INDEPENDENT_AMBULATORY_CARE_PROVIDER_SITE_OTHER): Payer: Medicare Other | Admitting: Gastroenterology

## 2011-09-16 ENCOUNTER — Encounter: Payer: Self-pay | Admitting: Gastroenterology

## 2011-09-16 VITALS — BP 133/79 | HR 72 | Temp 97.4°F | Ht 67.0 in | Wt 157.2 lb

## 2011-09-16 DIAGNOSIS — Z800 Family history of malignant neoplasm of digestive organs: Secondary | ICD-10-CM | POA: Insufficient documentation

## 2011-09-16 DIAGNOSIS — R1314 Dysphagia, pharyngoesophageal phase: Secondary | ICD-10-CM

## 2011-09-16 DIAGNOSIS — R1310 Dysphagia, unspecified: Secondary | ICD-10-CM | POA: Insufficient documentation

## 2011-09-16 MED ORDER — PEG-KCL-NACL-NASULF-NA ASC-C 100 G PO SOLR: 1.0000 | Freq: Once | ORAL | Status: DC

## 2011-09-16 NOTE — Assessment & Plan Note (Addendum)
Suspect recurrent esophageal ring. Recommend EGD/ED in near future. If she has ERE, she will need to stay on PPI indefinitely. She has not been maintained on a PPI. Denies chronic heartburn.   I have discussed the risks, alternatives, benefits with regards to but not limited to the risk of reaction to medication, bleeding, infection, perforation and the patient is agreeable to proceed. Written consent to be obtained.

## 2011-09-16 NOTE — Patient Instructions (Signed)
We have scheduled you for a colonoscopy and upper endoscopy. Please see separate instructions.

## 2011-09-16 NOTE — Assessment & Plan Note (Addendum)
Family history of colon cancer in first degree relative greater the age of 1. She also has 2 relatives with metastatic cancer of unknown primary. Recommended colonoscopy at this time for moderate risk screening. If she has a negative colonoscopy, potentially she will not need five-year followup studies.   I have discussed the risks, alternatives, benefits with regards to but not limited to the risk of reaction to medication, bleeding, infection, perforation and the patient is agreeable to proceed. Written consent to be obtained.

## 2011-09-16 NOTE — Progress Notes (Signed)
Primary Care Physician:  Milana Obey, MD  Primary Gastroenterologist:  Roetta Sessions, MD   Chief Complaint  Patient presents with  . Colonoscopy  . Dysphagia    HPI:  Candice Brewer is a 77 y.o. female here to schedule surveillance colonoscopy. FH CRC, father (age greater than 58). Also with paternal grandmother and paternal aunt with metastatic cancer unknown etiology - both full of cancer at time of initial surgery). Also with h/o Schatzki's ring s/p disruption 2009. Had ERE at that time.   No constipation, diarrhea, melena, brbpr. Eating Acitiva to help bowel function. Three of sons have had colonoscopy, "no problems". Having problems swallowing again, more frequent again. Food getting stuck in epigastric area, associated with pain. Been happening for several months. Just like back in 2009 when she required esophageal dilation. Recent episode bad, relieved with large belch. No vomiting. Has to use a lot of water to get pills down too. No heartburn.   Current Outpatient Prescriptions  Medication Sig Dispense Refill  . aspirin 81 MG tablet Take 81 mg by mouth daily.        . cloNIDine (CATAPRES) 0.1 MG tablet Take 0.1 mg by mouth 2 (two) times daily.        . enalapril (VASOTEC) 20 MG tablet Take 20 mg by mouth daily.        Marland Kitchen glipiZIDE (GLUCOTROL) 5 MG tablet Take 5 mg by mouth 2 (two) times daily before a meal.        . Multiple Vitamin (MULTIVITAMIN) capsule Take 1 capsule by mouth daily.          Allergies as of 09/16/2011 - Review Complete 09/16/2011  Allergen Reaction Noted  . Pravastatin sodium  05/01/2008  . Rosuvastatin  08/11/2009  . Triple antibiotic  10/28/2006    Past Medical History  Diagnosis Date  . Chest pain, unspecified   . Restless legs syndrome (RLS)   . Fibroids     uterus  . Arthritis   . Constipation   . HTN (hypertension)   . DM (diabetes mellitus)   . Hyperlipidemia     Past Surgical History  Procedure Date  . Appendectomy   . Tubal  ligation   . Cardiovascular stress test 2009  . Cataract extraction 2010    right eye  . Esophagogastroduodenoscopy 06/2008    Dr. Sinda Du ring s/p disruption, erosive reflux esophagitis, hh  . Colonoscopy 08/2006    Dr. Elmer Ramp  . Cataract extraction 2012    left eye    Family History  Problem Relation Age of Onset  . Prostate cancer Father   . Colon cancer Father     >age 60  . Cancer Paternal Grandmother     metastatic at time of diagnosis, primary unknown  . Cancer Paternal Aunt     metastatic at time of diagnosisi, primary unknown    History   Social History  . Marital Status: Divorced    Spouse Name: N/A    Number of Children: 5  . Years of Education: N/A   Occupational History  . retired Psychologist, occupational    Social History Main Topics  . Smoking status: Former Smoker -- 0.5 packs/day    Types: Cigarettes  . Smokeless tobacco: Not on file   Comment: quit about 35+ yrs  . Alcohol Use: No  . Drug Use: No  . Sexually Active: Not on file   Other Topics Concern  . Not on file   Social History Narrative  . No  narrative on file      ROS:  General: Negative for anorexia, weight loss, fever, chills, fatigue, weakness. Eyes: Negative for vision changes.  ENT: Negative for hoarseness, difficulty swallowing , nasal congestion. CV: Negative for chest pain, angina, palpitations, dyspnea on exertion, peripheral edema.  Respiratory: Negative for dyspnea at rest, dyspnea on exertion, cough, sputum, wheezing.  GI: See history of present illness. GU:  Negative for dysuria, hematuria, urinary incontinence, urinary frequency, nocturnal urination.  MS: Negative for joint pain, low back pain.  Derm: Negative for rash or itching.  Neuro: Negative for weakness, abnormal sensation, seizure, frequent headaches, memory loss, confusion.  Psych: Negative for anxiety, depression, suicidal ideation, hallucinations.  Endo: Negative for unusual weight change.  Heme:  Negative for bruising or bleeding. Allergy: Negative for rash or hives.    Physical Examination:  BP 133/79  Pulse 72  Temp(Src) 97.4 F (36.3 C) (Temporal)  Ht 5\' 7"  (1.702 m)  Wt 157 lb 3.2 oz (71.305 kg)  BMI 24.62 kg/m2   General: Well-nourished, well-developed in no acute distress.  Head: Normocephalic, atraumatic.   Eyes: Conjunctiva pink, no icterus. Mouth: Oropharyngeal mucosa moist and pink , no lesions erythema or exudate. Neck: Supple without thyromegaly, masses, or lymphadenopathy.  Lungs: Clear to auscultation bilaterally.  Heart: Regular rate and rhythm, no murmurs rubs or gallops.  Abdomen: Bowel sounds are normal, nontender, nondistended, no hepatosplenomegaly or masses, no abdominal bruits or    hernia , no rebound or guarding.   Rectal: Defer to time of TCS. Extremities: No lower extremity edema. No clubbing or deformities.  Neuro: Alert and oriented x 4 , grossly normal neurologically.  Skin: Warm and dry, no rash or jaundice.   Psych: Alert and cooperative, normal mood and affect.

## 2011-09-16 NOTE — Progress Notes (Signed)
Cc to PCP 

## 2011-09-30 DIAGNOSIS — K222 Esophageal obstruction: Secondary | ICD-10-CM

## 2011-09-30 HISTORY — PX: ESOPHAGOGASTRODUODENOSCOPY: SHX1529

## 2011-09-30 HISTORY — DX: Esophageal obstruction: K22.2

## 2011-10-07 ENCOUNTER — Encounter (HOSPITAL_COMMUNITY): Payer: Self-pay | Admitting: Pharmacy Technician

## 2011-10-12 MED ORDER — SODIUM CHLORIDE 0.45 % IV SOLN
Freq: Once | INTRAVENOUS | Status: AC
  Administered 2011-10-13: 20 mL/h via INTRAVENOUS

## 2011-10-13 ENCOUNTER — Encounter (HOSPITAL_COMMUNITY): Payer: Self-pay

## 2011-10-13 ENCOUNTER — Other Ambulatory Visit: Payer: Self-pay | Admitting: Internal Medicine

## 2011-10-13 ENCOUNTER — Encounter (HOSPITAL_COMMUNITY)
Admission: RE | Disposition: A | Discharge status: Home / Self Care | Payer: Self-pay | Source: Ambulatory Visit | Attending: Internal Medicine

## 2011-10-13 ENCOUNTER — Ambulatory Visit (HOSPITAL_COMMUNITY)
Admission: RE | Admit: 2011-10-13 | Discharge: 2011-10-13 | Disposition: A | Discharge status: Home / Self Care | Payer: Medicare Other | Source: Ambulatory Visit | Attending: Internal Medicine | Admitting: Internal Medicine

## 2011-10-13 DIAGNOSIS — R1310 Dysphagia, unspecified: Secondary | ICD-10-CM | POA: Insufficient documentation

## 2011-10-13 DIAGNOSIS — Z79899 Other long term (current) drug therapy: Secondary | ICD-10-CM | POA: Insufficient documentation

## 2011-10-13 DIAGNOSIS — K222 Esophageal obstruction: Secondary | ICD-10-CM | POA: Insufficient documentation

## 2011-10-13 DIAGNOSIS — Z7982 Long term (current) use of aspirin: Secondary | ICD-10-CM | POA: Insufficient documentation

## 2011-10-13 DIAGNOSIS — E119 Type 2 diabetes mellitus without complications: Secondary | ICD-10-CM | POA: Insufficient documentation

## 2011-10-13 DIAGNOSIS — Z01812 Encounter for preprocedural laboratory examination: Secondary | ICD-10-CM | POA: Insufficient documentation

## 2011-10-13 DIAGNOSIS — I10 Essential (primary) hypertension: Secondary | ICD-10-CM | POA: Insufficient documentation

## 2011-10-13 DIAGNOSIS — Z1211 Encounter for screening for malignant neoplasm of colon: Secondary | ICD-10-CM | POA: Insufficient documentation

## 2011-10-13 DIAGNOSIS — Z800 Family history of malignant neoplasm of digestive organs: Secondary | ICD-10-CM

## 2011-10-13 DIAGNOSIS — E785 Hyperlipidemia, unspecified: Secondary | ICD-10-CM | POA: Insufficient documentation

## 2011-10-13 HISTORY — PX: COLONOSCOPY: SHX5424

## 2011-10-13 LAB — GLUCOSE, CAPILLARY: Glucose-Capillary: 104 mg/dL — ABNORMAL HIGH (ref 70–99)

## 2011-10-13 SURGERY — COLONOSCOPY
Anesthesia: Moderate Sedation

## 2011-10-13 MED ORDER — MEPERIDINE HCL 100 MG/ML IJ SOLN
INTRAMUSCULAR | Status: AC
  Filled 2011-10-13: qty 2

## 2011-10-13 MED ORDER — MIDAZOLAM HCL 5 MG/5ML IJ SOLN
INTRAMUSCULAR | Status: DC | PRN
  Administered 2011-10-13 (×2): 1 mg via INTRAVENOUS
  Administered 2011-10-13: 2 mg via INTRAVENOUS

## 2011-10-13 MED ORDER — MIDAZOLAM HCL 5 MG/5ML IJ SOLN
INTRAMUSCULAR | Status: AC
  Filled 2011-10-13: qty 10

## 2011-10-13 MED ORDER — BUTAMBEN-TETRACAINE-BENZOCAINE 2-2-14 % EX AERO
INHALATION_SPRAY | CUTANEOUS | Status: DC | PRN
  Administered 2011-10-13: 2 via TOPICAL

## 2011-10-13 MED ORDER — MEPERIDINE HCL 100 MG/ML IJ SOLN
INTRAMUSCULAR | Status: DC | PRN
  Administered 2011-10-13: 25 mg via INTRAVENOUS
  Administered 2011-10-13: 50 mg via INTRAVENOUS

## 2011-10-13 MED ORDER — STERILE WATER FOR IRRIGATION IR SOLN
Status: DC | PRN
  Administered 2011-10-13: 08:00:00

## 2011-10-13 MED ORDER — PANTOPRAZOLE SODIUM 40 MG PO TBEC: 40.0000 mg | DELAYED_RELEASE_TABLET | Freq: Every day | ORAL | Status: DC

## 2011-10-13 NOTE — H&P (Signed)
  I have seen & examined the patient prior to the procedure(s) today and reviewed the history and physical/consultation.  There have been no changes.  After consideration of the risks, benefits, alternatives and imponderables, the patient has consented to the procedure(s).   

## 2011-10-14 NOTE — Progress Notes (Signed)
Pt called- rx did not go thru to pharmacy, called rx to [facility] per RMR rx.

## 2011-10-15 ENCOUNTER — Other Ambulatory Visit: Payer: Self-pay | Admitting: Gastroenterology

## 2011-10-15 DIAGNOSIS — K219 Gastro-esophageal reflux disease without esophagitis: Secondary | ICD-10-CM

## 2011-10-15 MED ORDER — OMEPRAZOLE 20 MG PO CPDR: 20.0000 mg | DELAYED_RELEASE_CAPSULE | Freq: Every day | ORAL | Status: DC

## 2011-10-18 DIAGNOSIS — K2990 Gastroduodenitis, unspecified, without bleeding: Secondary | ICD-10-CM

## 2011-10-18 DIAGNOSIS — K2970 Gastritis, unspecified, without bleeding: Secondary | ICD-10-CM

## 2011-10-18 DIAGNOSIS — Z800 Family history of malignant neoplasm of digestive organs: Secondary | ICD-10-CM

## 2011-10-18 DIAGNOSIS — Z1211 Encounter for screening for malignant neoplasm of colon: Secondary | ICD-10-CM

## 2011-10-18 DIAGNOSIS — K222 Esophageal obstruction: Secondary | ICD-10-CM

## 2011-10-18 DIAGNOSIS — R1310 Dysphagia, unspecified: Secondary | ICD-10-CM

## 2011-10-19 NOTE — Progress Notes (Signed)
Results Cc to PCP  

## 2011-10-20 ENCOUNTER — Encounter (HOSPITAL_COMMUNITY): Payer: Self-pay | Admitting: Internal Medicine

## 2011-10-25 NOTE — Progress Notes (Unsigned)
Spoke with pt- informed her that she has an hpylori infection per RMR path report. abx called to [facility]/[place]. pts number was wrong in chart. Changed number to the correct number.   Darl Pikes, please schedule pt appt 3 months.

## 2011-10-27 ENCOUNTER — Encounter: Payer: Self-pay | Admitting: Internal Medicine

## 2011-10-28 NOTE — Progress Notes (Signed)
Pt is aware of OV on 2/4 at 11 with LSL

## 2012-01-03 ENCOUNTER — Ambulatory Visit (INDEPENDENT_AMBULATORY_CARE_PROVIDER_SITE_OTHER): Payer: Medicare Other | Admitting: Gastroenterology

## 2012-01-03 ENCOUNTER — Encounter: Payer: Self-pay | Admitting: Gastroenterology

## 2012-01-03 DIAGNOSIS — K222 Esophageal obstruction: Secondary | ICD-10-CM

## 2012-01-03 DIAGNOSIS — Z800 Family history of malignant neoplasm of digestive organs: Secondary | ICD-10-CM

## 2012-01-03 DIAGNOSIS — R1310 Dysphagia, unspecified: Secondary | ICD-10-CM

## 2012-01-03 DIAGNOSIS — Q391 Atresia of esophagus with tracheo-esophageal fistula: Secondary | ICD-10-CM

## 2012-01-03 DIAGNOSIS — R1314 Dysphagia, pharyngoesophageal phase: Secondary | ICD-10-CM

## 2012-01-03 NOTE — Patient Instructions (Addendum)
Continue taking omeprazole for you reflux, erosions. I would take it for at least three months. Then you may stop it if you are not having frequent heartburn (2-3 times per week) or stomach pain. Continue drinking plenty of water. You will be due for your next colonoscopy in 09/2016.  Gastroesophageal Reflux Disease, Adult Gastroesophageal reflux disease (GERD) happens when acid from your stomach goes into your food pipe (esophagus). The acid can cause a burning feeling in your chest. Over time, the acid can make small holes (ulcers) in your food pipe.  HOME CARE  Ask your doctor for advice about:   Losing weight.   Quitting smoking.   Alcohol use.   Avoid foods and drinks that make your problems worse. You may want to avoid:   Caffeine and alcohol.   Chocolate.   Mints.   Garlic and onions.   Spicy foods.   Citrus fruits, such as oranges, lemons, or limes.   Foods that contain tomato, such as sauce, chili, salsa, and pizza.   Fried and fatty foods.   Avoid lying down for 3 hours before you go to bed or before you take a nap.   Eat small meals often, instead of large meals.   Wear loose-fitting clothing. Do not wear anything tight around your waist.   Raise (elevate) the head of your bed 6 to 8 inches with wood blocks. Using extra pillows does not help.   Only take medicines as told by your doctor.   Do not take aspirin or ibuprofen.  GET HELP RIGHT AWAY IF:   You have pain in your arms, neck, jaw, teeth, or back.   Your pain gets worse or changes.   You feel sick to your stomach (nauseous), throw up (vomit), or sweat (diaphoresis).   You feel short of breath, or you pass out (faint).   Your throw up is green, yellow, black, or looks like coffee grounds or blood.   Your poop (stool) is red, bloody, or black.  MAKE SURE YOU:   Understand these instructions.   Will watch your condition.   Will get help right away if you are not doing well or get worse.    Document Released: 05/03/2008 Document Revised: 07/28/2011 Document Reviewed: 06/04/2011 [facility] Patient Information 2012 [place], [place].

## 2012-01-03 NOTE — Progress Notes (Signed)
Faxed to PCP

## 2012-01-03 NOTE — Assessment & Plan Note (Addendum)
Schatzki ring status post recent dilation. Resolution of esophageal dysphagia. Denies typical heartburn on a routine basis. Given recent H. Pylori gastritis, recommend 3 month therapy with omeprazole. Patient is not sure she's been on that since she completed Prevpac therapy. After 3 months of omeprazole, if she does not have heartburn 2-3 times weekly or more, no abdominal discomfort, she can stop the therapy and see how she does. Office visit as needed.

## 2012-01-03 NOTE — Assessment & Plan Note (Signed)
Next colonoscopy due in November 2017 given family history of colon cancer.

## 2012-01-03 NOTE — Progress Notes (Signed)
Primary Care Physician: Milana Obey, MD, MD  Primary Gastroenterologist:  Roetta Sessions, MD   Chief Complaint  Patient presents with  . Follow-up    HPI: Candice Brewer is a 78 y.o. female here for followup of recent colonoscopy and upper endoscopy. She has a history of dysphagia and was found to have a Schatzki ring which was dilated. She was also found to have incidental H. Pylori gastritis. She has completed her triple drug therapy. Her colonoscopy was normal, next colonoscopy due in 5 years.  Swallowing better since dilation. No abdominal pain. Appetite good. Drinking much more water. BMs regular. Still eating Activia. No melena, brbpr.   Current Outpatient Prescriptions  Medication Sig Dispense Refill  . aspirin 81 MG tablet Take 81 mg by mouth daily.        . cloNIDine (CATAPRES) 0.1 MG tablet Take 0.1 mg by mouth 2 (two) times daily.        . enalapril (VASOTEC) 20 MG tablet Take 20 mg by mouth daily.        Marland Kitchen glipiZIDE (GLUCOTROL) 5 MG tablet Take 5 mg by mouth 2 (two) times daily before a meal.        . Multiple Vitamin (MULTIVITAMIN) capsule Take 1 capsule by mouth daily.        Marland Kitchen omeprazole (PRILOSEC) 20 MG capsule Take 1 capsule (20 mg total) by mouth daily.  30 capsule  11    Allergies as of 01/03/2012 - Review Complete 01/03/2012  Allergen Reaction Noted  . Pravastatin sodium  05/01/2008  . Rosuvastatin  08/11/2009  . Triple antibiotic  10/28/2006    ROS:  General: Negative for anorexia, weight loss, fever, chills, fatigue, weakness. ENT: Negative for hoarseness, difficulty swallowing , nasal congestion. CV: Negative for chest pain, angina, palpitations, dyspnea on exertion, peripheral edema.  Respiratory: Negative for dyspnea at rest, dyspnea on exertion, cough, sputum, wheezing.  GI: See history of present illness. GU:  Negative for dysuria, hematuria, urinary incontinence, urinary frequency, nocturnal urination.  Endo: Negative for unusual weight change.     Physical Examination:   BP 118/74  Pulse 68  Temp(Src) 97.6 F (36.4 C) (Temporal)  Ht 5' 7.5" (1.715 m)  Wt 155 lb 3.2 oz (70.398 kg)  BMI 23.95 kg/m2  General: Well-nourished, well-developed in no acute distress.  Eyes: No icterus. Mouth: Oropharyngeal mucosa moist and pink , no lesions erythema or exudate. Lungs: Clear to auscultation bilaterally.  Heart: Regular rate and rhythm, no murmurs rubs or gallops.  Abdomen: Bowel sounds are normal, nontender, nondistended, no hepatosplenomegaly or masses, no abdominal bruits or hernia , no rebound or guarding.   Extremities: No lower extremity edema. No clubbing or deformities. Neuro: Alert and oriented x 4   Skin: Warm and dry, no jaundice.   Psych: Alert and cooperative, normal mood and affect.

## 2012-01-24 ENCOUNTER — Telehealth: Payer: Self-pay | Admitting: Gastroenterology

## 2012-01-24 NOTE — Telephone Encounter (Signed)
Needs to have her Omeprazole called in to [facility] in [place] please advise??

## 2012-01-25 MED ORDER — OMEPRAZOLE 20 MG PO CPDR: 20.0000 mg | DELAYED_RELEASE_CAPSULE | Freq: Every day | ORAL | Status: DC

## 2012-01-25 NOTE — Telephone Encounter (Signed)
Done

## 2012-07-12 ENCOUNTER — Other Ambulatory Visit (HOSPITAL_COMMUNITY): Payer: Self-pay | Admitting: Family Medicine

## 2012-07-12 DIAGNOSIS — Z139 Encounter for screening, unspecified: Secondary | ICD-10-CM

## 2012-08-17 ENCOUNTER — Ambulatory Visit (HOSPITAL_COMMUNITY): Payer: Medicare Other

## 2012-09-07 ENCOUNTER — Ambulatory Visit (HOSPITAL_COMMUNITY)
Admission: RE | Admit: 2012-09-07 | Discharge: 2012-09-07 | Disposition: A | Discharge status: Home / Self Care | Payer: Medicare Other | Source: Ambulatory Visit | Attending: Family Medicine | Admitting: Family Medicine

## 2012-09-07 DIAGNOSIS — Z139 Encounter for screening, unspecified: Secondary | ICD-10-CM

## 2012-09-07 DIAGNOSIS — Z1231 Encounter for screening mammogram for malignant neoplasm of breast: Secondary | ICD-10-CM | POA: Insufficient documentation

## 2013-01-17 ENCOUNTER — Ambulatory Visit (HOSPITAL_COMMUNITY)
Admission: RE | Admit: 2013-01-17 | Discharge: 2013-01-17 | Disposition: A | Discharge status: Home / Self Care | Payer: Medicare Other | Source: Ambulatory Visit | Attending: Family Medicine | Admitting: Family Medicine

## 2013-01-17 ENCOUNTER — Other Ambulatory Visit (HOSPITAL_COMMUNITY): Payer: Self-pay | Admitting: Family Medicine

## 2013-01-17 DIAGNOSIS — M7989 Other specified soft tissue disorders: Secondary | ICD-10-CM | POA: Insufficient documentation

## 2013-01-17 DIAGNOSIS — R609 Edema, unspecified: Secondary | ICD-10-CM

## 2013-01-17 DIAGNOSIS — R52 Pain, unspecified: Secondary | ICD-10-CM

## 2013-01-17 DIAGNOSIS — M79609 Pain in unspecified limb: Secondary | ICD-10-CM | POA: Insufficient documentation

## 2013-08-14 ENCOUNTER — Other Ambulatory Visit (HOSPITAL_COMMUNITY): Payer: Self-pay | Admitting: Family Medicine

## 2013-08-14 DIAGNOSIS — Z1231 Encounter for screening mammogram for malignant neoplasm of breast: Secondary | ICD-10-CM

## 2013-08-14 DIAGNOSIS — Z139 Encounter for screening, unspecified: Secondary | ICD-10-CM

## 2013-09-10 ENCOUNTER — Ambulatory Visit (HOSPITAL_COMMUNITY): Payer: Medicare Other

## 2013-09-10 ENCOUNTER — Ambulatory Visit (HOSPITAL_COMMUNITY)
Admission: RE | Admit: 2013-09-10 | Discharge: 2013-09-10 | Disposition: A | Discharge status: Home / Self Care | Payer: Medicare Other | Source: Ambulatory Visit | Attending: Family Medicine | Admitting: Family Medicine

## 2013-09-10 DIAGNOSIS — Z1231 Encounter for screening mammogram for malignant neoplasm of breast: Secondary | ICD-10-CM | POA: Insufficient documentation

## 2013-09-10 DIAGNOSIS — Z139 Encounter for screening, unspecified: Secondary | ICD-10-CM

## 2014-05-13 ENCOUNTER — Other Ambulatory Visit (HOSPITAL_COMMUNITY): Payer: Self-pay | Admitting: Internal Medicine

## 2014-05-13 DIAGNOSIS — Z780 Asymptomatic menopausal state: Secondary | ICD-10-CM

## 2014-05-13 DIAGNOSIS — E559 Vitamin D deficiency, unspecified: Secondary | ICD-10-CM

## 2014-05-16 ENCOUNTER — Ambulatory Visit (HOSPITAL_COMMUNITY)
Admission: RE | Admit: 2014-05-16 | Discharge: 2014-05-16 | Disposition: A | Discharge status: Home / Self Care | Payer: Medicare Other | Source: Ambulatory Visit | Attending: Internal Medicine | Admitting: Internal Medicine

## 2014-05-16 DIAGNOSIS — E559 Vitamin D deficiency, unspecified: Secondary | ICD-10-CM

## 2014-05-16 DIAGNOSIS — Z780 Asymptomatic menopausal state: Secondary | ICD-10-CM | POA: Insufficient documentation

## 2014-09-06 ENCOUNTER — Other Ambulatory Visit (HOSPITAL_COMMUNITY): Payer: Self-pay | Admitting: Family Medicine

## 2014-09-06 DIAGNOSIS — Z1231 Encounter for screening mammogram for malignant neoplasm of breast: Secondary | ICD-10-CM

## 2014-09-11 ENCOUNTER — Ambulatory Visit (HOSPITAL_COMMUNITY): Payer: Medicare Other

## 2014-09-19 ENCOUNTER — Ambulatory Visit (HOSPITAL_COMMUNITY)
Admission: RE | Admit: 2014-09-19 | Discharge: 2014-09-19 | Disposition: A | Discharge status: Home / Self Care | Payer: Medicare Other | Source: Ambulatory Visit | Attending: Family Medicine | Admitting: Family Medicine

## 2014-09-19 DIAGNOSIS — Z1231 Encounter for screening mammogram for malignant neoplasm of breast: Secondary | ICD-10-CM | POA: Diagnosis present

## 2014-09-24 ENCOUNTER — Other Ambulatory Visit: Payer: Self-pay | Admitting: Family Medicine

## 2014-09-24 DIAGNOSIS — R928 Other abnormal and inconclusive findings on diagnostic imaging of breast: Secondary | ICD-10-CM

## 2014-10-08 ENCOUNTER — Ambulatory Visit (HOSPITAL_COMMUNITY)
Admission: RE | Admit: 2014-10-08 | Discharge: 2014-10-08 | Disposition: A | Discharge status: Home / Self Care | Payer: Medicare Other | Source: Ambulatory Visit | Attending: Family Medicine | Admitting: Family Medicine

## 2014-10-08 DIAGNOSIS — R928 Other abnormal and inconclusive findings on diagnostic imaging of breast: Secondary | ICD-10-CM | POA: Diagnosis not present

## 2015-02-12 ENCOUNTER — Encounter: Payer: Self-pay | Admitting: Internal Medicine

## 2015-03-07 ENCOUNTER — Other Ambulatory Visit: Payer: Self-pay

## 2015-03-07 ENCOUNTER — Ambulatory Visit (INDEPENDENT_AMBULATORY_CARE_PROVIDER_SITE_OTHER): Payer: Medicare Other | Admitting: Gastroenterology

## 2015-03-07 ENCOUNTER — Encounter: Payer: Self-pay | Admitting: Gastroenterology

## 2015-03-07 VITALS — BP 170/78 | HR 74 | Temp 97.1°F | Ht 65.0 in | Wt 155.4 lb

## 2015-03-07 DIAGNOSIS — R195 Other fecal abnormalities: Secondary | ICD-10-CM | POA: Diagnosis not present

## 2015-03-07 DIAGNOSIS — R159 Full incontinence of feces: Secondary | ICD-10-CM

## 2015-03-07 MED ORDER — PEG 3350-KCL-NA BICARB-NACL 420 G PO SOLR: 4000.0000 mL | Freq: Once | ORAL | Status: DC

## 2015-03-07 NOTE — Progress Notes (Signed)
Primary Care Physician:  Robert Bellow, MD  Primary Gastroenterologist:  Garfield Cornea, MD   Chief Complaint  Patient presents with  . HEME + stool    HPI:  Candice Brewer is a 81 y.o. female here for further evaluation of Hemoccult-positive stool, fecal incontinence at the request of Dr. Karie Kirks. Last seen in February 2013. She has a history of dysphagia, Schatzki ring which was dilated. History of H. pylori gastritis on last endoscopy, completed triple drug therapy (2012). Last colonoscopy in 2012 was normal, next one due in 2017 due to family history of colon cancer.  Patient recently saw Dr. Karie Kirks for episodes of fecal incontinence. Has been happening for several months. Sometimes it occurs when she is just sitting around. She has also noted it after walking. She would pass half inch of stool without knowing it. Occurs about twice daily. Has not tried anything to see if it would improve symptoms. Generally has BM 1-2 per day. Stools are formed. Recent brbpr. Heme + on DRE by PCP. Noted to have good rectal tone on exam, some stool palpated within an inch of the anal verge.   Denies any associated rectal pain, abdominal pain. Reflux is well managed. Denies any dysphagia, weight loss, vomiting.   Current Outpatient Prescriptions  Medication Sig Dispense Refill  . acyclovir (ZOVIRAX) 800 MG tablet TAKE 1 TABLET TWICE A DAY    . aspirin 81 MG tablet Take 81 mg by mouth daily.      . Carboxymethylcellulose Sodium 0.25 % SOLN Apply to eye.    . Cholecalciferol (VITAMIN D3) 2000 UNITS TABS Take 2,000 Units by mouth daily.    . cloNIDine (CATAPRES) 0.1 MG tablet Take 0.1 mg by mouth 2 (two) times daily.      . enalapril (VASOTEC) 20 MG tablet Take 20 mg by mouth daily.      Marland Kitchen latanoprost (XALATAN) 0.005 % ophthalmic solution INSTILL 1 DROP IN BOTH EYES NIGHTLY    . metFORMIN (GLUCOPHAGE) 500 MG tablet Take 1,000 mg by mouth 2 (two) times daily with a meal.     . Multiple Vitamin  (MULTIVITAMIN) capsule Take 1 capsule by mouth daily.      Vladimir Faster Glycol-Propyl Glycol 0.4-0.3 % SOLN Apply to eye.    . prednisoLONE acetate (PRED FORTE) 1 % ophthalmic suspension 1 drop in the right eye twice a day, 1 drop in the left eye twice a day on Mondays and Fridays.    Marland Kitchen omeprazole (PRILOSEC) 20 MG capsule Take 1 capsule (20 mg total) by mouth daily. 30 capsule 11   No current facility-administered medications for this visit.    Allergies as of 03/07/2015 - Review Complete 03/07/2015  Allergen Reaction Noted  . Neomycin-bacitracin zn-polymyx  10/28/2006  . Pravastatin sodium  05/01/2008  . Rosuvastatin  08/11/2009    Past Medical History  Diagnosis Date  . Chest pain, unspecified   . Restless legs syndrome (RLS)   . Fibroids     uterus  . Arthritis   . Constipation   . HTN (hypertension)   . DM (diabetes mellitus)   . Hyperlipidemia   . Esophageal stricture 09/2011    schatzki's ring  . Vitamin D deficiency   . Helicobacter pylori gastritis 2012    Treated    Past Surgical History  Procedure Laterality Date  . Appendectomy    . Tubal ligation    . Cardiovascular stress test  2009  . Cataract extraction  2010    right  eye  . Esophagogastroduodenoscopy  06/2008    Dr. Lorrin Mais ring s/p disruption, erosive reflux esophagitis, hh.   . Colonoscopy  08/2006    Dr. Vivi Ferns  . Cataract extraction  2012    left eye  . Colonoscopy  10/13/2011    Normal. Next TCS 09/2016.   Procedure: COLONOSCOPY;  Surgeon: Daneil Dolin, MD;  Location: [facility];  Service: Endoscopy;  Laterality: N/A;  8:15  . Esophagogastroduodenoscopy  09/2011    Schatki's ring s/p dilation, multiple 1-65mm antral and bulbar erosions, bx positive for H.Pylori s/ treatment    Family History  Problem Relation Age of Onset  . Prostate cancer Father   . Colon cancer Father     >age 60  . Cancer Paternal Grandmother     metastatic at time of diagnosis, primary unknown   . Cancer Paternal Aunt     metastatic at time of diagnosisi, primary unknown    History   Social History  . Marital Status: Divorced    Spouse Name: N/A  . Number of Children: 5  . Years of Education: N/A   Occupational History  . retired Customer service manager    Social History Main Topics  . Smoking status: Former Smoker -- 0.50 packs/day    Types: Cigarettes  . Smokeless tobacco: Not on file     Comment: quit about 35+ yrs  . Alcohol Use: No  . Drug Use: No  . Sexual Activity: Not on file   Other Topics Concern  . Not on file   Social History Narrative      ROS:  General: Negative for anorexia, weight loss, fever, chills, fatigue, weakness. Eyes: Negative for vision changes.  ENT: Negative for hoarseness, difficulty swallowing , nasal congestion. CV: Negative for chest pain, angina, palpitations, dyspnea on exertion, peripheral edema.  Respiratory: Negative for dyspnea at rest, dyspnea on exertion, cough, sputum, wheezing.  GI: See history of present illness. GU:  Negative for dysuria, hematuria, urinary incontinence, urinary frequency, nocturnal urination.  MS: Negative for joint pain, low back pain.  Derm: Negative for rash or itching.  Neuro: Negative for weakness, abnormal sensation, seizure, frequent headaches, memory loss, confusion.  Psych: Negative for anxiety, depression, suicidal ideation, hallucinations.  Endo: Negative for unusual weight change.  Heme: Negative for bruising or bleeding. Allergy: Negative for rash or hives.    Physical Examination:  BP 170/78 mmHg  Pulse 74  Temp(Src) 97.1 F (36.2 C) (Oral)  Ht 5\' 5"  (1.651 m)  Wt 155 lb 6.4 oz (70.489 kg)  BMI 25.86 kg/m2   General: Well-nourished, well-developed in no acute distress.  Head: Normocephalic, atraumatic.   Eyes: Conjunctiva pink, no icterus. Mouth: Oropharyngeal mucosa moist and pink , no lesions erythema or exudate. Neck: Supple without thyromegaly, masses, or lymphadenopathy.  Lungs:  Clear to auscultation bilaterally.  Heart: Regular rate and rhythm, no murmurs rubs or gallops.  Abdomen: Bowel sounds are normal, nontender, nondistended, no hepatosplenomegaly or masses, no abdominal bruits or    hernia , no rebound or guarding.   Rectal: Deferred to time of colonoscopy Extremities: No lower extremity edema. No clubbing or deformities.  Neuro: Alert and oriented x 4 , grossly normal neurologically.  Skin: Warm and dry, no rash or jaundice.   Psych: Alert and cooperative, normal mood and affect.

## 2015-03-07 NOTE — Patient Instructions (Signed)
1. Colonoscopy with Dr. Rourk. See separate instructions.  

## 2015-03-07 NOTE — Assessment & Plan Note (Signed)
81 year old female who presents for further evaluation of incontinence at times of solid stool, Hemoccult positive stools. Last colonoscopy in 2012. Family history of colon cancer. Patient is incontinent of stool about twice daily, small solid pieces of stool found in underwear couple of times per day. At other times is able to control her bowels. Heme positive recently. Recommend colonoscopy for diagnostic purposes. Rule out anorectal abnormality contributing to symptoms, determine source of heme positive stool. Voided any upper GI symptoms therefore no upper endoscopy plan. Colonoscopy in near future.  I have discussed the risks, alternatives, benefits with regards to but not limited to the risk of reaction to medication, bleeding, infection, perforation and the patient is agreeable to proceed. Written consent to be obtained.

## 2015-03-10 ENCOUNTER — Encounter (HOSPITAL_COMMUNITY)
Admission: RE | Disposition: A | Discharge status: Home / Self Care | Payer: Self-pay | Source: Ambulatory Visit | Attending: Internal Medicine

## 2015-03-10 ENCOUNTER — Ambulatory Visit (HOSPITAL_COMMUNITY)
Admission: RE | Admit: 2015-03-10 | Discharge: 2015-03-10 | Disposition: A | Discharge status: Home / Self Care | Payer: Medicare Other | Source: Ambulatory Visit | Attending: Internal Medicine | Admitting: Internal Medicine

## 2015-03-10 ENCOUNTER — Encounter (HOSPITAL_COMMUNITY): Payer: Self-pay

## 2015-03-10 DIAGNOSIS — Q438 Other specified congenital malformations of intestine: Secondary | ICD-10-CM | POA: Diagnosis not present

## 2015-03-10 DIAGNOSIS — K921 Melena: Secondary | ICD-10-CM | POA: Insufficient documentation

## 2015-03-10 DIAGNOSIS — M1990 Unspecified osteoarthritis, unspecified site: Secondary | ICD-10-CM | POA: Insufficient documentation

## 2015-03-10 DIAGNOSIS — Z7982 Long term (current) use of aspirin: Secondary | ICD-10-CM | POA: Insufficient documentation

## 2015-03-10 DIAGNOSIS — R159 Full incontinence of feces: Secondary | ICD-10-CM

## 2015-03-10 DIAGNOSIS — K635 Polyp of colon: Secondary | ICD-10-CM | POA: Insufficient documentation

## 2015-03-10 DIAGNOSIS — Z9851 Tubal ligation status: Secondary | ICD-10-CM | POA: Insufficient documentation

## 2015-03-10 DIAGNOSIS — I10 Essential (primary) hypertension: Secondary | ICD-10-CM | POA: Insufficient documentation

## 2015-03-10 DIAGNOSIS — Z800 Family history of malignant neoplasm of digestive organs: Secondary | ICD-10-CM | POA: Diagnosis not present

## 2015-03-10 DIAGNOSIS — E119 Type 2 diabetes mellitus without complications: Secondary | ICD-10-CM | POA: Insufficient documentation

## 2015-03-10 DIAGNOSIS — Z79899 Other long term (current) drug therapy: Secondary | ICD-10-CM | POA: Diagnosis not present

## 2015-03-10 DIAGNOSIS — E785 Hyperlipidemia, unspecified: Secondary | ICD-10-CM | POA: Diagnosis not present

## 2015-03-10 DIAGNOSIS — K649 Unspecified hemorrhoids: Secondary | ICD-10-CM | POA: Diagnosis not present

## 2015-03-10 DIAGNOSIS — Z87891 Personal history of nicotine dependence: Secondary | ICD-10-CM | POA: Insufficient documentation

## 2015-03-10 DIAGNOSIS — R195 Other fecal abnormalities: Secondary | ICD-10-CM

## 2015-03-10 HISTORY — PX: COLONOSCOPY: SHX5424

## 2015-03-10 LAB — GLUCOSE, CAPILLARY: Glucose-Capillary: 113 mg/dL — ABNORMAL HIGH (ref 70–99)

## 2015-03-10 SURGERY — COLONOSCOPY
Anesthesia: Moderate Sedation

## 2015-03-10 MED ORDER — MIDAZOLAM HCL 5 MG/5ML IJ SOLN
INTRAMUSCULAR | Status: DC | PRN
  Administered 2015-03-10 (×4): 1 mg via INTRAVENOUS
  Administered 2015-03-10: 2 mg via INTRAVENOUS

## 2015-03-10 MED ORDER — SODIUM CHLORIDE 0.9 % IV SOLN
INTRAVENOUS | Status: DC
  Administered 2015-03-10: 10:00:00 via INTRAVENOUS

## 2015-03-10 MED ORDER — MEPERIDINE HCL 100 MG/ML IJ SOLN
INTRAMUSCULAR | Status: AC
  Filled 2015-03-10: qty 2

## 2015-03-10 MED ORDER — MEPERIDINE HCL 100 MG/ML IJ SOLN
INTRAMUSCULAR | Status: DC | PRN
  Administered 2015-03-10 (×2): 25 mg via INTRAVENOUS

## 2015-03-10 MED ORDER — ONDANSETRON HCL 4 MG/2ML IJ SOLN
INTRAMUSCULAR | Status: AC
  Filled 2015-03-10: qty 2

## 2015-03-10 MED ORDER — STERILE WATER FOR IRRIGATION IR SOLN
Status: DC | PRN
  Administered 2015-03-10: 11:00:00

## 2015-03-10 MED ORDER — MIDAZOLAM HCL 5 MG/5ML IJ SOLN
INTRAMUSCULAR | Status: AC
  Filled 2015-03-10: qty 10

## 2015-03-10 MED ORDER — ONDANSETRON HCL 4 MG/2ML IJ SOLN
INTRAMUSCULAR | Status: DC | PRN
  Administered 2015-03-10: 4 mg via INTRAVENOUS

## 2015-03-10 NOTE — Interval H&P Note (Signed)
History and Physical Interval Note:  03/10/2015 10:29 AM  Candice Brewer  has presented today for surgery, with the diagnosis of heme positive stool, fecal incontience  The various methods of treatment have been discussed with the patient and family. After consideration of risks, benefits and other options for treatment, the patient has consented to  Procedure(s): COLONOSCOPY (N/A) as a surgical intervention .  The patient's history has been reviewed, patient examined, no change in status, stable for surgery.  I have reviewed the patient's chart and labs.  Questions were answered to the patient's satisfaction.     Bashar Milam  No change. Diagnostic colonoscopy per plan.The risks, benefits, limitations, alternatives and imponderables have been reviewed with the patient. Questions have been answered. All parties are agreeable.

## 2015-03-10 NOTE — H&P (View-Only) (Signed)
Primary Care Physician:  Robert Bellow, MD  Primary Gastroenterologist:  Garfield Cornea, MD   Chief Complaint  Patient presents with  . HEME + stool    HPI:  Candice Brewer is a 81 y.o. female here for further evaluation of Hemoccult-positive stool, fecal incontinence at the request of Dr. Karie Kirks. Last seen in February 2013. She has a history of dysphagia, Schatzki ring which was dilated. History of H. pylori gastritis on last endoscopy, completed triple drug therapy (2012). Last colonoscopy in 2012 was normal, next one due in 2017 due to family history of colon cancer.  Patient recently saw Dr. Karie Kirks for episodes of fecal incontinence. Has been happening for several months. Sometimes it occurs when she is just sitting around. She has also noted it after walking. She would pass half inch of stool without knowing it. Occurs about twice daily. Has not tried anything to see if it would improve symptoms. Generally has BM 1-2 per day. Stools are formed. Recent brbpr. Heme + on DRE by PCP. Noted to have good rectal tone on exam, some stool palpated within an inch of the anal verge.   Denies any associated rectal pain, abdominal pain. Reflux is well managed. Denies any dysphagia, weight loss, vomiting.   Current Outpatient Prescriptions  Medication Sig Dispense Refill  . acyclovir (ZOVIRAX) 800 MG tablet TAKE 1 TABLET TWICE A DAY    . aspirin 81 MG tablet Take 81 mg by mouth daily.      . Carboxymethylcellulose Sodium 0.25 % SOLN Apply to eye.    . Cholecalciferol (VITAMIN D3) 2000 UNITS TABS Take 2,000 Units by mouth daily.    . cloNIDine (CATAPRES) 0.1 MG tablet Take 0.1 mg by mouth 2 (two) times daily.      . enalapril (VASOTEC) 20 MG tablet Take 20 mg by mouth daily.      Marland Kitchen latanoprost (XALATAN) 0.005 % ophthalmic solution INSTILL 1 DROP IN BOTH EYES NIGHTLY    . metFORMIN (GLUCOPHAGE) 500 MG tablet Take 1,000 mg by mouth 2 (two) times daily with a meal.     . Multiple Vitamin  (MULTIVITAMIN) capsule Take 1 capsule by mouth daily.      Vladimir Faster Glycol-Propyl Glycol 0.4-0.3 % SOLN Apply to eye.    . prednisoLONE acetate (PRED FORTE) 1 % ophthalmic suspension 1 drop in the right eye twice a day, 1 drop in the left eye twice a day on Mondays and Fridays.    Marland Kitchen omeprazole (PRILOSEC) 20 MG capsule Take 1 capsule (20 mg total) by mouth daily. 30 capsule 11   No current facility-administered medications for this visit.    Allergies as of 03/07/2015 - Review Complete 03/07/2015  Allergen Reaction Noted  . Neomycin-bacitracin zn-polymyx  10/28/2006  . Pravastatin sodium  05/01/2008  . Rosuvastatin  08/11/2009    Past Medical History  Diagnosis Date  . Chest pain, unspecified   . Restless legs syndrome (RLS)   . Fibroids     uterus  . Arthritis   . Constipation   . HTN (hypertension)   . DM (diabetes mellitus)   . Hyperlipidemia   . Esophageal stricture 09/2011    schatzki's ring  . Vitamin D deficiency   . Helicobacter pylori gastritis 2012    Treated    Past Surgical History  Procedure Laterality Date  . Appendectomy    . Tubal ligation    . Cardiovascular stress test  2009  . Cataract extraction  2010    right  eye  . Esophagogastroduodenoscopy  06/2008    Dr. Lorrin Mais ring s/p disruption, erosive reflux esophagitis, hh.   . Colonoscopy  08/2006    Dr. Vivi Ferns  . Cataract extraction  2012    left eye  . Colonoscopy  10/13/2011    Normal. Next TCS 09/2016.   Procedure: COLONOSCOPY;  Surgeon: Daneil Dolin, MD;  Location: [facility];  Service: Endoscopy;  Laterality: N/A;  8:15  . Esophagogastroduodenoscopy  09/2011    Schatki's ring s/p dilation, multiple 1-77mm antral and bulbar erosions, bx positive for H.Pylori s/ treatment    Family History  Problem Relation Age of Onset  . Prostate cancer Father   . Colon cancer Father     >age 20  . Cancer Paternal Grandmother     metastatic at time of diagnosis, primary unknown   . Cancer Paternal Aunt     metastatic at time of diagnosisi, primary unknown    History   Social History  . Marital Status: Divorced    Spouse Name: N/A  . Number of Children: 5  . Years of Education: N/A   Occupational History  . retired Customer service manager    Social History Main Topics  . Smoking status: Former Smoker -- 0.50 packs/day    Types: Cigarettes  . Smokeless tobacco: Not on file     Comment: quit about 35+ yrs  . Alcohol Use: No  . Drug Use: No  . Sexual Activity: Not on file   Other Topics Concern  . Not on file   Social History Narrative      ROS:  General: Negative for anorexia, weight loss, fever, chills, fatigue, weakness. Eyes: Negative for vision changes.  ENT: Negative for hoarseness, difficulty swallowing , nasal congestion. CV: Negative for chest pain, angina, palpitations, dyspnea on exertion, peripheral edema.  Respiratory: Negative for dyspnea at rest, dyspnea on exertion, cough, sputum, wheezing.  GI: See history of present illness. GU:  Negative for dysuria, hematuria, urinary incontinence, urinary frequency, nocturnal urination.  MS: Negative for joint pain, low back pain.  Derm: Negative for rash or itching.  Neuro: Negative for weakness, abnormal sensation, seizure, frequent headaches, memory loss, confusion.  Psych: Negative for anxiety, depression, suicidal ideation, hallucinations.  Endo: Negative for unusual weight change.  Heme: Negative for bruising or bleeding. Allergy: Negative for rash or hives.    Physical Examination:  BP 170/78 mmHg  Pulse 74  Temp(Src) 97.1 F (36.2 C) (Oral)  Ht 5\' 5"  (1.651 m)  Wt 155 lb 6.4 oz (70.489 kg)  BMI 25.86 kg/m2   General: Well-nourished, well-developed in no acute distress.  Head: Normocephalic, atraumatic.   Eyes: Conjunctiva pink, no icterus. Mouth: Oropharyngeal mucosa moist and pink , no lesions erythema or exudate. Neck: Supple without thyromegaly, masses, or lymphadenopathy.  Lungs:  Clear to auscultation bilaterally.  Heart: Regular rate and rhythm, no murmurs rubs or gallops.  Abdomen: Bowel sounds are normal, nontender, nondistended, no hepatosplenomegaly or masses, no abdominal bruits or    hernia , no rebound or guarding.   Rectal: Deferred to time of colonoscopy Extremities: No lower extremity edema. No clubbing or deformities.  Neuro: Alert and oriented x 4 , grossly normal neurologically.  Skin: Warm and dry, no rash or jaundice.   Psych: Alert and cooperative, normal mood and affect.

## 2015-03-10 NOTE — Progress Notes (Signed)
cc'ed to pcp °

## 2015-03-10 NOTE — Op Note (Signed)
[facility] [address] [place], [address]   COLONOSCOPY PROCEDURE REPORT  PATIENT: Candice Brewer, Candice Brewer  MR#: [id number] BIRTHDATE: Dec 01, 1931 , 82  yrs. old GENDER: female ENDOSCOPIST: R.  Garfield Cornea, MD FACP [facility] REFERRED AJ:GOTLX Karie Kirks, M.D. PROCEDURE DATE:  Apr 01, 2015 PROCEDURE:   Colonoscopy with biopsy INDICATIONS: MEDICATIONS: Versed 6 mg IV and Demerol 50 mg IV in divided doses. Zofran 4 mg IV. ASA CLASS:       Class II  CONSENT: The risks, benefits, alternatives and imponderables including but not limited to bleeding, perforation as well as the possibility of a missed lesion have been reviewed.  The potential for biopsy, lesion removal, etc. have also been discussed. Questions have been answered.  All parties agreeable.  Please see the history and physical in the medical record for more information.  DESCRIPTION OF PROCEDURE:   After the risks benefits and alternatives of the procedure were thoroughly explained, informed consent was obtained.  The digital rectal exam      The EC-[id number] ([id number])  endoscope was introduced through the anus and advanced to the cecum, which was identified by both the appendix and ileocecal valve. No adverse events experienced.   The quality of the prep was adequate  The instrument was then slowly withdrawn as the colon was fully examined.      COLON FINDINGS: External / anal canal hemorrhoids / Good sphincter tone maintained.; rectal mucosa otherwise appeared normal Rectal vault small - unable to retroflex but rectal mucosa seen well on?"face.  Somewhat redundant colon.  (1) diminutive sigmoid polyp otherwise, the remainder of the colonic mucosa appeared normal.   The above-mentioned problems: Biopsy removed.  Retroflexion was not performed. .  Withdrawal time=17 minutes 0 seconds.  The scope was withdrawn and the procedure completed. COMPLICATIONS: There were no immediate complications.  ENDOSCOPIC  IMPRESSION: External and anal canal hemorrhoids?"likely source of hematochezia. Redundant colon. Single sigmoid polyp removed as described above.  RECOMMENDATIONS: Follow up on pathology. Course of Anusol suppositories. Add Benefiber daily.  eSigned:  R. Garfield Cornea, MD Rosalita Chessman [facility] 04/01/15 11:16 AM   cc:  CPT CODES: ICD CODES:  The ICD and CPT codes recommended by this software are interpretations from the data that the clinical staff has captured with the software.  The verification of the translation of this report to the ICD and CPT codes and modifiers is the sole responsibility of the health care institution and practicing physician where this report was generated.  [employer]. will not be held responsible for the validity of the ICD and CPT codes included on this report.  AMA assumes no liability for data contained or not contained herein. CPT is a Designer, television/film set of the [employer].

## 2015-03-10 NOTE — Discharge Instructions (Signed)
Hemorrhoid and polyp information provided  Ten-day course of Anusol suppositories  Begin Benefiber 2 teaspoons twice daily  Further recommendations to follow pending review of pathology report.    Colonoscopy Discharge Instructions  Read the instructions outlined below and refer to this sheet in the next few weeks. These discharge instructions provide you with general information on caring for yourself after you leave the hospital. Your doctor may also give you specific instructions. While your treatment has been planned according to the most current medical practices available, unavoidable complications occasionally occur. If you have any problems or questions after discharge, call Dr. Gala Romney at [phone]. ACTIVITY  You may resume your regular activity, but move at a slower pace for the next 24 hours.   Take frequent rest periods for the next 24 hours.   Walking will help get rid of the air and reduce the bloated feeling in your belly (abdomen).   No driving for 24 hours (because of the medicine (anesthesia) used during the test).    Do not sign any important legal documents or operate any machinery for 24 hours (because of the anesthesia used during the test).  NUTRITION  Drink plenty of fluids.   You may resume your normal diet as instructed by your doctor.   Begin with a light meal and progress to your normal diet. Heavy or fried foods are harder to digest and may make you feel sick to your stomach (nauseated).   Avoid alcoholic beverages for 24 hours or as instructed.  MEDICATIONS  You may resume your normal medications unless your doctor tells you otherwise.  WHAT YOU CAN EXPECT TODAY  Some feelings of bloating in the abdomen.   Passage of more gas than usual.   Spotting of blood in your stool or on the toilet paper.  IF YOU HAD POLYPS REMOVED DURING THE COLONOSCOPY:  No aspirin products for 7 days or as instructed.   No alcohol for 7 days or as instructed.   Eat  a soft diet for the next 24 hours.  FINDING OUT THE RESULTS OF YOUR TEST Not all test results are available during your visit. If your test results are not back during the visit, make an appointment with your caregiver to find out the results. Do not assume everything is normal if you have not heard from your caregiver or the medical facility. It is important for you to follow up on all of your test results.  SEEK IMMEDIATE MEDICAL ATTENTION IF:  You have more than a spotting of blood in your stool.   Your belly is swollen (abdominal distention).   You are nauseated or vomiting.   You have a temperature over 101.   You have abdominal pain or discomfort that is severe or gets worse throughout the day.    Hemorrhoids Hemorrhoids are swollen veins around the rectum or anus. There are two types of hemorrhoids:   Internal hemorrhoids. These occur in the veins just inside the rectum. They may poke through to the outside and become irritated and painful.  External hemorrhoids. These occur in the veins outside the anus and can be felt as a painful swelling or hard lump near the anus. CAUSES  Pregnancy.   Obesity.   Constipation or diarrhea.   Straining to have a bowel movement.   Sitting for long periods on the toilet.  Heavy lifting or other activity that caused you to strain.  Anal intercourse. SYMPTOMS   Pain.   Anal itching or irritation.  Rectal bleeding.   Fecal leakage.   Anal swelling.   One or more lumps around the anus.  DIAGNOSIS  Your caregiver may be able to diagnose hemorrhoids by visual examination. Other examinations or tests that may be performed include:   Examination of the rectal area with a gloved hand (digital rectal exam).   Examination of anal canal using a small tube (scope).   A blood test if you have lost a significant amount of blood.  A test to look inside the colon (sigmoidoscopy or colonoscopy). TREATMENT Most  hemorrhoids can be treated at home. However, if symptoms do not seem to be getting better or if you have a lot of rectal bleeding, your caregiver may perform a procedure to help make the hemorrhoids get smaller or remove them completely. Possible treatments include:   Placing a rubber band at the base of the hemorrhoid to cut off the circulation (rubber band ligation).   Injecting a chemical to shrink the hemorrhoid (sclerotherapy).   Using a tool to burn the hemorrhoid (infrared light therapy).   Surgically removing the hemorrhoid (hemorrhoidectomy).   Stapling the hemorrhoid to block blood flow to the tissue (hemorrhoid stapling).  HOME CARE INSTRUCTIONS   Eat foods with fiber, such as whole grains, beans, nuts, fruits, and vegetables. Ask your doctor about taking products with added fiber in them (fibersupplements).  Increase fluid intake. Drink enough water and fluids to keep your urine clear or pale yellow.   Exercise regularly.   Go to the bathroom when you have the urge to have a bowel movement. Do not wait.   Avoid straining to have bowel movements.   Keep the anal area dry and clean. Use wet toilet paper or moist towelettes after a bowel movement.   Medicated creams and suppositories may be used or applied as directed.   Only take over-the-counter or prescription medicines as directed by your caregiver.   Take warm sitz baths for 15-20 minutes, 3-4 times a day to ease pain and discomfort.   Place ice packs on the hemorrhoids if they are tender and swollen. Using ice packs between sitz baths may be helpful.   Put ice in a plastic bag.   Place a towel between your skin and the bag.   Leave the ice on for 15-20 minutes, 3-4 times a day.   Do not use a donut-shaped pillow or sit on the toilet for long periods. This increases blood pooling and pain.  SEEK MEDICAL CARE IF:  You have increasing pain and swelling that is not controlled by treatment or  medicine.  You have uncontrolled bleeding.  You have difficulty or you are unable to have a bowel movement.  You have pain or inflammation outside the area of the hemorrhoids. MAKE SURE YOU:  Understand these instructions.  Will watch your condition.  Will get help right away if you are not doing well or get worse. Document Released: 11/12/2000 Document Revised: 11/01/2012 Document Reviewed: 09/19/2012 [facility] Patient Information 2015 [place], [place]. This information is not intended to replace advice given to you by your health care provider. Make sure you discuss any questions you have with your health care provider.   Colon Polyps Polyps are lumps of extra tissue growing inside the body. Polyps can grow in the large intestine (colon). Most colon polyps are noncancerous (benign). However, some colon polyps can become cancerous over time. Polyps that are larger than a pea may be harmful. To be safe, caregivers remove and test all  polyps. CAUSES  Polyps form when mutations in the genes cause your cells to grow and divide even though no more tissue is needed. RISK FACTORS There are a number of risk factors that can increase your chances of getting colon polyps. They include:  Being older than 50 years.  Family history of colon polyps or colon cancer.  Long-term colon diseases, such as colitis or Crohn disease.  Being overweight.  Smoking.  Being inactive.  Drinking too much alcohol. SYMPTOMS  Most small polyps do not cause symptoms. If symptoms are present, they may include:  Blood in the stool. The stool may look dark red or black.  Constipation or diarrhea that lasts longer than 1 week. DIAGNOSIS People often do not know they have polyps until their caregiver finds them during a regular checkup. Your caregiver can use 4 tests to check for polyps:  Digital rectal exam. The caregiver wears gloves and feels inside the rectum. This test would find polyps only in the  rectum.  Barium enema. The caregiver puts a liquid called barium into your rectum before taking X-rays of your colon. Barium makes your colon look Vora Clover. Polyps are dark, so they are easy to see in the X-ray pictures.  Sigmoidoscopy. A thin, flexible tube (sigmoidoscope) is placed into your rectum. The sigmoidoscope has a light and tiny camera in it. The caregiver uses the sigmoidoscope to look at the last third of your colon.  Colonoscopy. This test is like sigmoidoscopy, but the caregiver looks at the entire colon. This is the most common method for finding and removing polyps. TREATMENT  Any polyps will be removed during a sigmoidoscopy or colonoscopy. The polyps are then tested for cancer. PREVENTION  To help lower your risk of getting more colon polyps:  Eat plenty of fruits and vegetables. Avoid eating fatty foods.  Do not smoke.  Avoid drinking alcohol.  Exercise every day.  Lose weight if recommended by your caregiver.  Eat plenty of calcium and folate. Foods that are rich in calcium include milk, cheese, and broccoli. Foods that are rich in folate include chickpeas, kidney beans, and spinach. HOME CARE INSTRUCTIONS Keep all follow-up appointments as directed by your caregiver. You may need periodic exams to check for polyps. SEEK MEDICAL CARE IF: You notice bleeding during a bowel movement. Document Released: 08/11/2004 Document Revised: 02/07/2012 Document Reviewed: 01/25/2012 [facility] Patient Information 2015 [place], [place]. This information is not intended to replace advice given to you by your health care provider. Make sure you discuss any questions you have with your health care provider.

## 2015-03-11 ENCOUNTER — Encounter (HOSPITAL_COMMUNITY): Payer: Self-pay | Admitting: Internal Medicine

## 2015-03-16 ENCOUNTER — Encounter: Payer: Self-pay | Admitting: Internal Medicine

## 2015-03-17 ENCOUNTER — Encounter: Payer: Self-pay | Admitting: Internal Medicine

## 2015-04-29 ENCOUNTER — Ambulatory Visit (INDEPENDENT_AMBULATORY_CARE_PROVIDER_SITE_OTHER): Payer: Medicare Other | Admitting: Gastroenterology

## 2015-04-29 ENCOUNTER — Encounter (INDEPENDENT_AMBULATORY_CARE_PROVIDER_SITE_OTHER): Payer: Self-pay

## 2015-04-29 ENCOUNTER — Encounter: Payer: Self-pay | Admitting: Gastroenterology

## 2015-04-29 VITALS — BP 134/76 | HR 74 | Temp 97.6°F | Ht 67.0 in | Wt 151.6 lb

## 2015-04-29 DIAGNOSIS — R159 Full incontinence of feces: Secondary | ICD-10-CM

## 2015-04-29 DIAGNOSIS — R195 Other fecal abnormalities: Secondary | ICD-10-CM

## 2015-04-29 NOTE — Assessment & Plan Note (Signed)
Recent colonoscopy for fecal incontinence, heme positive stools noted on digital rectal exam. Colonoscopy revealed external/anal canal hemorrhoid disease. Single hyperplastic polyp removed. Otherwise unremarkable exam. Noted to have good sphincter tone. Patient presents back today stating she is doing very well. No further incontinence issues. Bowel movements are much softer and she feels like this is helping the issue. Doing remarkably well. No further complaints. Return to the office as needed.

## 2015-04-29 NOTE — Patient Instructions (Signed)
1. Please call if you have any further gastrointestinal issues.

## 2015-04-29 NOTE — Progress Notes (Signed)
      Primary Care Physician: Robert Bellow, MD  Primary Gastroenterologist:  Garfield Cornea, MD   Chief Complaint  Patient presents with  . Follow-up    HPI: Candice Brewer is a 81 y.o. female here for follow-up. She has a history of Hemoccult-positive stool, fecal incontinence seen back in April 2016. Colonoscopy on 03/10/2015 showed external/anal canal hemorrhoids, good sphincter tone maintained, diminutive sigmoid polyp which was hyperplastic but otherwise unremarkable exam.  BM 1-2 times per day. No rectal bleeding. No further incontinence. Good appetite. No abdominal pain. No heartburn or swallowing problems. Feels good. No fiber but increased water intake.   Current Outpatient Prescriptions  Medication Sig Dispense Refill  . acyclovir (ZOVIRAX) 800 MG tablet Take 800 mg by mouth daily. TAKE 1 TABLET TWICE A DAY    . aspirin 81 MG tablet Take 81 mg by mouth daily.      . Carboxymethylcellulose Sodium 0.25 % SOLN Apply 1 drop to eye at bedtime.     . Cholecalciferol (VITAMIN D3) 2000 UNITS TABS Take 2,000 Units by mouth daily.    . cloNIDine (CATAPRES) 0.1 MG tablet Take 0.1 mg by mouth 2 (two) times daily.      . enalapril (VASOTEC) 20 MG tablet Take 20 mg by mouth daily.      Marland Kitchen latanoprost (XALATAN) 0.005 % ophthalmic solution INSTILL 1 DROP IN BOTH EYES NIGHTLY    . metFORMIN (GLUCOPHAGE) 500 MG tablet Take 500 mg by mouth 2 (two) times daily with a meal.     . Multiple Vitamin (MULTIVITAMIN) capsule Take 1 capsule by mouth daily.      Vladimir Faster Glycol-Propyl Glycol 0.4-0.3 % SOLN Apply 1 drop to eye daily as needed (dry eyes).     . prednisoLONE acetate (PRED FORTE) 1 % ophthalmic suspension 1 drop in the right eye twice a day, 1 drop in the left eye twice a day on Mondays and Fridays.     No current facility-administered medications for this visit.    Allergies as of 04/29/2015 - Review Complete 04/29/2015  Allergen Reaction Noted  . Neomycin-bacitracin zn-polymyx   10/28/2006  . Pravastatin sodium  05/01/2008  . Rosuvastatin  08/11/2009    ROS:  General: Negative for anorexia, weight loss, fever, chills, fatigue, weakness. ENT: Negative for hoarseness, difficulty swallowing , nasal congestion. CV: Negative for chest pain, angina, palpitations, dyspnea on exertion, peripheral edema.  Respiratory: Negative for dyspnea at rest, dyspnea on exertion, cough, sputum, wheezing.  GI: See history of present illness. GU:  Negative for dysuria, hematuria, urinary incontinence, urinary frequency, nocturnal urination.  Endo: Negative for unusual weight change.    Physical Examination:   BP 134/76 mmHg  Pulse 74  Temp(Src) 97.6 F (36.4 C) (Oral)  Ht 5\' 7"  (1.702 m)  Wt 151 lb 9.6 oz (68.765 kg)  BMI 23.74 kg/m2  General: Well-nourished, well-developed in no acute distress.  Eyes: No icterus. Abdomen: Bowel sounds are normal, nontender, nondistended, no hepatosplenomegaly or masses, no abdominal bruits or hernia , no rebound or guarding.   Neuro: Alert and oriented x 4   Skin: Warm and dry, no jaundice.   Psych: Alert and cooperative, normal mood and affect.   Imaging Studies: No results found.

## 2015-05-14 NOTE — Progress Notes (Signed)
cc'd to pcp 

## 2015-11-18 ENCOUNTER — Other Ambulatory Visit (HOSPITAL_COMMUNITY): Payer: Self-pay | Admitting: Family Medicine

## 2015-11-18 DIAGNOSIS — Z1231 Encounter for screening mammogram for malignant neoplasm of breast: Secondary | ICD-10-CM

## 2015-11-26 ENCOUNTER — Ambulatory Visit (HOSPITAL_COMMUNITY): Payer: Medicare Other

## 2015-12-15 ENCOUNTER — Ambulatory Visit (HOSPITAL_COMMUNITY)
Admission: RE | Admit: 2015-12-15 | Discharge: 2015-12-15 | Disposition: A | Discharge status: Home / Self Care | Payer: Medicare Other | Source: Ambulatory Visit | Attending: Family Medicine | Admitting: Family Medicine

## 2015-12-15 DIAGNOSIS — Z1231 Encounter for screening mammogram for malignant neoplasm of breast: Secondary | ICD-10-CM | POA: Diagnosis present

## 2016-02-27 ENCOUNTER — Emergency Department (HOSPITAL_COMMUNITY): Payer: Medicare Other

## 2016-02-27 ENCOUNTER — Encounter (HOSPITAL_COMMUNITY): Payer: Self-pay | Admitting: Emergency Medicine

## 2016-02-27 ENCOUNTER — Inpatient Hospital Stay (HOSPITAL_COMMUNITY)
Admission: EM | Admit: 2016-02-27 | Discharge: 2016-03-04 | DRG: 178 | Disposition: A | Discharge status: Home Health | Payer: Medicare Other | Attending: Internal Medicine | Admitting: Internal Medicine

## 2016-02-27 DIAGNOSIS — E871 Hypo-osmolality and hyponatremia: Secondary | ICD-10-CM | POA: Diagnosis present

## 2016-02-27 DIAGNOSIS — R05 Cough: Secondary | ICD-10-CM | POA: Diagnosis not present

## 2016-02-27 DIAGNOSIS — Z7984 Long term (current) use of oral hypoglycemic drugs: Secondary | ICD-10-CM

## 2016-02-27 DIAGNOSIS — J189 Pneumonia, unspecified organism: Secondary | ICD-10-CM | POA: Diagnosis present

## 2016-02-27 DIAGNOSIS — E119 Type 2 diabetes mellitus without complications: Secondary | ICD-10-CM

## 2016-02-27 DIAGNOSIS — D649 Anemia, unspecified: Secondary | ICD-10-CM | POA: Diagnosis present

## 2016-02-27 DIAGNOSIS — G2581 Restless legs syndrome: Secondary | ICD-10-CM | POA: Diagnosis present

## 2016-02-27 DIAGNOSIS — R0781 Pleurodynia: Secondary | ICD-10-CM | POA: Diagnosis present

## 2016-02-27 DIAGNOSIS — Z7982 Long term (current) use of aspirin: Secondary | ICD-10-CM

## 2016-02-27 DIAGNOSIS — J15212 Pneumonia due to Methicillin resistant Staphylococcus aureus: Secondary | ICD-10-CM | POA: Diagnosis not present

## 2016-02-27 DIAGNOSIS — I152 Hypertension secondary to endocrine disorders: Secondary | ICD-10-CM | POA: Diagnosis present

## 2016-02-27 DIAGNOSIS — E1159 Type 2 diabetes mellitus with other circulatory complications: Secondary | ICD-10-CM | POA: Diagnosis present

## 2016-02-27 DIAGNOSIS — I10 Essential (primary) hypertension: Secondary | ICD-10-CM

## 2016-02-27 DIAGNOSIS — Z800 Family history of malignant neoplasm of digestive organs: Secondary | ICD-10-CM

## 2016-02-27 DIAGNOSIS — Z66 Do not resuscitate: Secondary | ICD-10-CM | POA: Diagnosis present

## 2016-02-27 DIAGNOSIS — E785 Hyperlipidemia, unspecified: Secondary | ICD-10-CM | POA: Diagnosis present

## 2016-02-27 DIAGNOSIS — A4901 Methicillin susceptible Staphylococcus aureus infection, unspecified site: Secondary | ICD-10-CM | POA: Diagnosis present

## 2016-02-27 LAB — COMPREHENSIVE METABOLIC PANEL
ALBUMIN: 3.9 g/dL (ref 3.5–5.0)
ALT: 15 U/L (ref 14–54)
ANION GAP: 10 (ref 5–15)
AST: 21 U/L (ref 15–41)
Alkaline Phosphatase: 71 U/L (ref 38–126)
BUN: 14 mg/dL (ref 6–20)
CHLORIDE: 101 mmol/L (ref 101–111)
CO2: 23 mmol/L (ref 22–32)
Calcium: 8.9 mg/dL (ref 8.9–10.3)
Creatinine, Ser: 0.9 mg/dL (ref 0.44–1.00)
GFR calc Af Amer: >60 mL/min (ref >60)
GFR, EST NON AFRICAN AMERICAN: 58 mL/min — AB (ref >60)
Glucose, Bld: 185 mg/dL — ABNORMAL HIGH (ref 65–99)
POTASSIUM: 3.6 mmol/L (ref 3.5–5.1)
Sodium: 134 mmol/L — ABNORMAL LOW (ref 135–145)
Total Bilirubin: 1 mg/dL (ref 0.3–1.2)
Total Protein: 7.4 g/dL (ref 6.5–8.1)

## 2016-02-27 LAB — CBC WITH DIFFERENTIAL/PLATELET
BASOS ABS: 0 10*3/uL (ref 0.0–0.1)
BASOS PCT: 0 %
EOS PCT: 0 %
Eosinophils Absolute: 0 10*3/uL (ref 0.0–0.7)
HCT: 34.4 % — ABNORMAL LOW (ref 36.0–46.0)
Hemoglobin: 12 g/dL (ref 12.0–15.0)
Lymphocytes Relative: 11 %
Lymphs Abs: 1.7 10*3/uL (ref 0.7–4.0)
MCH: 34.1 pg — ABNORMAL HIGH (ref 26.0–34.0)
MCHC: 34.9 g/dL (ref 30.0–36.0)
MCV: 97.7 fL (ref 78.0–100.0)
MONOS PCT: 7 %
Monocytes Absolute: 1 10*3/uL (ref 0.1–1.0)
Neutro Abs: 12.5 10*3/uL — ABNORMAL HIGH (ref 1.7–7.7)
Neutrophils Relative %: 82 %
PLATELETS: 169 10*3/uL (ref 150–400)
RBC: 3.52 MIL/uL — ABNORMAL LOW (ref 3.87–5.11)
RDW: 13.1 % (ref 11.5–15.5)
WBC: 15.2 10*3/uL — ABNORMAL HIGH (ref 4.0–10.5)

## 2016-02-27 LAB — I-STAT CG4 LACTIC ACID, ED: LACTIC ACID, VENOUS: 1.04 mmol/L (ref 0.5–2.0)

## 2016-02-27 LAB — INFLUENZA PANEL BY PCR (TYPE A & B)
H1N1FLUPCR: NOT DETECTED
Influenza A By PCR: NEGATIVE
Influenza B By PCR: NEGATIVE

## 2016-02-27 LAB — TROPONIN I: Troponin I: <0.03 ng/mL (ref <0.031)

## 2016-02-27 LAB — CBG MONITORING, ED: Glucose-Capillary: 194 mg/dL — ABNORMAL HIGH (ref 65–99)

## 2016-02-27 MED ORDER — AZITHROMYCIN 250 MG PO TABS
500.0000 mg | ORAL_TABLET | Freq: Every day | ORAL | Status: DC
  Administered 2016-02-28 – 2016-03-01 (×3): 500 mg via ORAL
  Filled 2016-02-27 (×3): qty 2

## 2016-02-27 MED ORDER — ONDANSETRON HCL 4 MG/2ML IJ SOLN: 4.0000 mg | Freq: Four times a day (QID) | INTRAMUSCULAR | Status: DC | PRN

## 2016-02-27 MED ORDER — ENALAPRIL MALEATE 5 MG PO TABS
20.0000 mg | ORAL_TABLET | Freq: Every day | ORAL | Status: DC
  Administered 2016-02-28 – 2016-03-04 (×6): 20 mg via ORAL
  Filled 2016-02-27 (×2): qty 4
  Filled 2016-02-27: qty 1
  Filled 2016-02-27: qty 4
  Filled 2016-02-27 (×2): qty 1
  Filled 2016-02-27 (×2): qty 4

## 2016-02-27 MED ORDER — ENOXAPARIN SODIUM 40 MG/0.4ML ~~LOC~~ SOLN
40.0000 mg | SUBCUTANEOUS | Status: DC
  Administered 2016-02-27 – 2016-03-03 (×6): 40 mg via SUBCUTANEOUS
  Filled 2016-02-27 (×6): qty 0.4

## 2016-02-27 MED ORDER — ACETAMINOPHEN 325 MG PO TABS
650.0000 mg | ORAL_TABLET | Freq: Four times a day (QID) | ORAL | Status: DC | PRN
  Administered 2016-02-28 – 2016-03-03 (×8): 650 mg via ORAL
  Filled 2016-02-27 (×8): qty 2

## 2016-02-27 MED ORDER — ALBUTEROL SULFATE (2.5 MG/3ML) 0.083% IN NEBU
2.5000 mg | INHALATION_SOLUTION | Freq: Once | RESPIRATORY_TRACT | Status: AC
  Administered 2016-02-27: 2.5 mg via RESPIRATORY_TRACT
  Filled 2016-02-27: qty 3

## 2016-02-27 MED ORDER — ASPIRIN 81 MG PO CHEW
81.0000 mg | CHEWABLE_TABLET | Freq: Every day | ORAL | Status: DC
  Administered 2016-02-28 – 2016-03-04 (×6): 81 mg via ORAL
  Filled 2016-02-27 (×6): qty 1

## 2016-02-27 MED ORDER — CLONIDINE HCL 0.1 MG PO TABS
0.1000 mg | ORAL_TABLET | Freq: Two times a day (BID) | ORAL | Status: DC
  Administered 2016-02-28 – 2016-03-01 (×6): 0.1 mg via ORAL
  Filled 2016-02-27 (×7): qty 1

## 2016-02-27 MED ORDER — ACETAMINOPHEN 325 MG PO TABS
650.0000 mg | ORAL_TABLET | Freq: Once | ORAL | Status: AC
  Administered 2016-02-27: 650 mg via ORAL
  Filled 2016-02-27: qty 2

## 2016-02-27 MED ORDER — LATANOPROST 0.005 % OP SOLN
1.0000 [drp] | Freq: Every day | OPHTHALMIC | Status: DC
  Administered 2016-02-28 – 2016-03-03 (×5): 1 [drp] via OPHTHALMIC
  Filled 2016-02-27: qty 2.5

## 2016-02-27 MED ORDER — SODIUM CHLORIDE 0.9 % IV BOLUS (SEPSIS)
500.0000 mL | Freq: Once | INTRAVENOUS | Status: AC
  Administered 2016-02-27: 500 mL via INTRAVENOUS

## 2016-02-27 MED ORDER — OXYCODONE HCL 5 MG PO TABS
5.0000 mg | ORAL_TABLET | ORAL | Status: DC | PRN
  Administered 2016-02-28 – 2016-03-04 (×16): 5 mg via ORAL
  Filled 2016-02-27 (×16): qty 1

## 2016-02-27 MED ORDER — IPRATROPIUM-ALBUTEROL 0.5-2.5 (3) MG/3ML IN SOLN
3.0000 mL | Freq: Once | RESPIRATORY_TRACT | Status: AC
  Administered 2016-02-27: 3 mL via RESPIRATORY_TRACT
  Filled 2016-02-27: qty 3

## 2016-02-27 MED ORDER — ADULT MULTIVITAMIN W/MINERALS CH
1.0000 | ORAL_TABLET | Freq: Every day | ORAL | Status: DC
Start: 2016-02-27 — End: 2016-03-04
  Administered 2016-02-28 – 2016-03-04 (×6): 1 via ORAL
  Filled 2016-02-27 (×6): qty 1

## 2016-02-27 MED ORDER — POLYVINYL ALCOHOL 1.4 % OP SOLN
1.0000 [drp] | Freq: Every day | OPHTHALMIC | Status: DC | PRN
  Administered 2016-02-28 – 2016-03-02 (×2): 1 [drp] via OPHTHALMIC
  Filled 2016-02-27: qty 15

## 2016-02-27 MED ORDER — ACETAMINOPHEN 650 MG RE SUPP: 650.0000 mg | Freq: Four times a day (QID) | RECTAL | Status: DC | PRN

## 2016-02-27 MED ORDER — DEXTROSE 5 % IV SOLN
1.0000 g | INTRAVENOUS | Status: DC
  Administered 2016-02-27 – 2016-03-01 (×4): 1 g via INTRAVENOUS
  Filled 2016-02-27 (×6): qty 10

## 2016-02-27 MED ORDER — PREDNISOLONE ACETATE 1 % OP SUSP
1.0000 [drp] | Freq: Two times a day (BID) | OPHTHALMIC | Status: DC
  Administered 2016-02-28 – 2016-03-04 (×11): 1 [drp] via OPHTHALMIC
  Filled 2016-02-27: qty 1

## 2016-02-27 MED ORDER — ONDANSETRON HCL 4 MG PO TABS: 4.0000 mg | ORAL_TABLET | Freq: Four times a day (QID) | ORAL | Status: DC | PRN

## 2016-02-27 MED ORDER — SODIUM CHLORIDE 0.9 % IV SOLN
INTRAVENOUS | Status: DC
  Administered 2016-02-27: 20:00:00 via INTRAVENOUS

## 2016-02-27 MED ORDER — GUAIFENESIN ER 600 MG PO TB12
600.0000 mg | ORAL_TABLET | Freq: Two times a day (BID) | ORAL | Status: DC
  Administered 2016-02-28 – 2016-03-04 (×11): 600 mg via ORAL
  Filled 2016-02-27 (×15): qty 1

## 2016-02-27 MED ORDER — AZITHROMYCIN 250 MG PO TABS
500.0000 mg | ORAL_TABLET | Freq: Once | ORAL | Status: AC
  Administered 2016-02-27: 500 mg via ORAL
  Filled 2016-02-27: qty 2

## 2016-02-27 MED ORDER — ACYCLOVIR 800 MG PO TABS
800.0000 mg | ORAL_TABLET | Freq: Two times a day (BID) | ORAL | Status: DC
  Administered 2016-02-28 – 2016-03-04 (×11): 800 mg via ORAL
  Filled 2016-02-27 (×13): qty 1

## 2016-02-27 MED ORDER — ALBUTEROL SULFATE (2.5 MG/3ML) 0.083% IN NEBU
2.5000 mg | INHALATION_SOLUTION | RESPIRATORY_TRACT | Status: DC | PRN
  Administered 2016-02-27 – 2016-03-04 (×2): 2.5 mg via RESPIRATORY_TRACT
  Filled 2016-02-27: qty 3

## 2016-02-27 MED ORDER — MORPHINE SULFATE (PF) 2 MG/ML IV SOLN
0.5000 mg | INTRAVENOUS | Status: DC | PRN
  Administered 2016-02-29: 0.5 mg via INTRAVENOUS
  Filled 2016-02-27: qty 1

## 2016-02-27 MED ORDER — INSULIN ASPART 100 UNIT/ML ~~LOC~~ SOLN
0.0000 [IU] | Freq: Three times a day (TID) | SUBCUTANEOUS | Status: DC
  Administered 2016-02-28: 2 [IU] via SUBCUTANEOUS
  Administered 2016-02-28 (×2): 3 [IU] via SUBCUTANEOUS
  Administered 2016-02-29: 2 [IU] via SUBCUTANEOUS
  Administered 2016-02-29: 1 [IU] via SUBCUTANEOUS
  Administered 2016-02-29: 3 [IU] via SUBCUTANEOUS
  Administered 2016-03-01 (×2): 1 [IU] via SUBCUTANEOUS
  Administered 2016-03-02 (×3): 2 [IU] via SUBCUTANEOUS
  Administered 2016-03-03: 3 [IU] via SUBCUTANEOUS
  Administered 2016-03-03: 2 [IU] via SUBCUTANEOUS
  Administered 2016-03-03 – 2016-03-04 (×2): 1 [IU] via SUBCUTANEOUS
  Administered 2016-03-04: 2 [IU] via SUBCUTANEOUS
  Filled 2016-02-27 (×2): qty 1

## 2016-02-27 NOTE — ED Provider Notes (Signed)
CSN: [id number]     Arrival date & time 02/27/16  0915 History   First MD Initiated Contact with Patient 02/27/16 1017     Chief Complaint  Patient presents with  . Cough     (Consider location/radiation/quality/duration/timing/severity/associated sxs/prior Treatment) HPI  Candice Brewer is a 82 y.o. female who presents to the Emergency Department complaining of sudden onset of generalized body aches, fever, chills associated with cough. Symptoms began last evening. She states the cough is nonproductive although feels as if she needs to cough something up. She reports intermittent cough during the night. Symptoms have worsened this morning. She reports recent sick contacts. She is not taking any medications for her symptoms.  She denies shortness of breath, chest pain, lower extremity swelling, dysuria or vomiting or bowel changes.  Past Medical History  Diagnosis Date  . Chest pain, unspecified   . Restless legs syndrome (RLS)   . Fibroids     uterus  . Arthritis   . Constipation   . HTN (hypertension)   . DM (diabetes mellitus) ([place])   . Hyperlipidemia   . Esophageal stricture 09/2011    schatzki's ring  . Vitamin D deficiency   . Helicobacter pylori gastritis 2012    Treated   Past Surgical History  Procedure Laterality Date  . Appendectomy    . Tubal ligation    . Cardiovascular stress test  2009  . Cataract extraction  2010    right eye  . Esophagogastroduodenoscopy  06/2008    Dr. Lorrin Mais ring s/p disruption, erosive reflux esophagitis, hh.   . Colonoscopy  08/2006    Dr. Vivi Ferns  . Cataract extraction  2012    left eye  . Colonoscopy  10/13/2011    Normal. Next TCS 09/2016.   Procedure: COLONOSCOPY;  Surgeon: Daneil Dolin, MD;  Location: [facility];  Service: Endoscopy;  Laterality: N/A;  8:15  . Esophagogastroduodenoscopy  09/2011    Schatki's ring s/p dilation, multiple 1-69mm antral and bulbar erosions, bx positive for H.Pylori s/  treatment  . Colonoscopy N/A 03/10/2015    RMR: External and anal canal hemorrhoids likely source of hematochezia. Redundant colon. Single sigmoid polyp removed ad described above.    Family History  Problem Relation Age of Onset  . Prostate cancer Father   . Colon cancer Father     >age 77  . Cancer Paternal Grandmother     metastatic at time of diagnosis, primary unknown  . Cancer Paternal Aunt     metastatic at time of diagnosisi, primary unknown   Social History  Substance Use Topics  . Smoking status: Former Smoker -- 0.50 packs/day    Types: Cigarettes  . Smokeless tobacco: None     Comment: quit about 35+ yrs  . Alcohol Use: No   OB History    No data available     Review of Systems  Constitutional: Positive for fever, chills and fatigue. Negative for activity change and appetite change.  HENT: Positive for congestion and sore throat. Negative for ear pain, facial swelling and trouble swallowing.   Eyes: Negative for visual disturbance.  Respiratory: Positive for cough and chest tightness. Negative for shortness of breath, wheezing and stridor.   Cardiovascular: Negative for chest pain.  Gastrointestinal: Negative for nausea, vomiting and abdominal pain.  Genitourinary: Negative for dysuria, frequency and difficulty urinating.  Musculoskeletal: Positive for myalgias. Negative for neck pain and neck stiffness.  Skin: Negative.   Neurological: Negative for dizziness, weakness, numbness  and headaches.  Hematological: Negative for adenopathy.  Psychiatric/Behavioral: Negative for confusion.  All other systems reviewed and are negative.     Allergies  Neomycin-bacitracin zn-polymyx; Pravastatin sodium; and Rosuvastatin  Home Medications   Prior to Admission medications   Medication Sig Start Date End Date Taking? Authorizing Provider  acyclovir (ZOVIRAX) 800 MG tablet Take 800 mg by mouth daily. TAKE 1 TABLET TWICE A DAY 10/23/14   Historical Provider, MD  aspirin  81 MG tablet Take 81 mg by mouth daily.      Historical Provider, MD  Carboxymethylcellulose Sodium 0.25 % SOLN Apply 1 drop to eye at bedtime.     Historical Provider, MD  Cholecalciferol (VITAMIN D3) 2000 UNITS TABS Take 2,000 Units by mouth daily.    Historical Provider, MD  cloNIDine (CATAPRES) 0.1 MG tablet Take 0.1 mg by mouth 2 (two) times daily.      Historical Provider, MD  enalapril (VASOTEC) 20 MG tablet Take 20 mg by mouth daily.      Historical Provider, MD  latanoprost (XALATAN) 0.005 % ophthalmic solution INSTILL 1 DROP IN BOTH EYES NIGHTLY 12/31/14   Historical Provider, MD  metFORMIN (GLUCOPHAGE) 500 MG tablet Take 500 mg by mouth 2 (two) times daily with a meal.     Historical Provider, MD  Multiple Vitamin (MULTIVITAMIN) capsule Take 1 capsule by mouth daily.      Historical Provider, MD  Polyethyl Glycol-Propyl Glycol 0.4-0.3 % SOLN Apply 1 drop to eye daily as needed (dry eyes).     Historical Provider, MD  prednisoLONE acetate (PRED FORTE) 1 % ophthalmic suspension 1 drop in the right eye twice a day, 1 drop in the left eye twice a day on Mondays and Fridays. 10/09/12   Historical Provider, MD   BP 145/80 mmHg  Pulse 109  Temp(Src) 101.5 F (38.6 C) (Oral)  Resp 20  Ht 5\' 7"  (1.702 m)  Wt 67.132 kg  BMI 23.17 kg/m2  SpO2 99% Physical Exam  Constitutional: She is oriented to person, place, and time.  Neck: Normal range of motion. Neck supple. No JVD present.  Cardiovascular: Normal rate, regular rhythm, normal heart sounds and intact distal pulses.   Pulmonary/Chest: Effort normal. No respiratory distress. She has no wheezes. She has no rales.  Lung sounds are diminished bilaterally, no rales or wheezes present.  Musculoskeletal: Normal range of motion.  Lymphadenopathy:    She has no cervical adenopathy.  Neurological: She is alert and oriented to person, place, and time. Coordination normal.  Skin: Skin is warm and dry. No rash noted.  Psychiatric: She has a normal  mood and affect.  Nursing note and vitals reviewed.   ED Course  Procedures (including critical care time) Labs Review Labs Reviewed  CBC WITH DIFFERENTIAL/PLATELET - Abnormal; Notable for the following:    WBC 15.2 (*)    RBC 3.52 (*)    HCT 34.4 (*)    MCH 34.1 (*)    Neutro Abs 12.5 (*)    All other components within normal limits  COMPREHENSIVE METABOLIC PANEL - Abnormal; Notable for the following:    Sodium 134 (*)    Glucose, Bld 185 (*)    GFR calc non Af Amer 58 (*)    All other components within normal limits  CULTURE, BLOOD (ROUTINE X 2)  CULTURE, BLOOD (ROUTINE X 2)  INFLUENZA PANEL BY PCR (TYPE A & B, H1N1)  TROPONIN I  I-STAT CG4 LACTIC ACID, ED    Imaging Review Dg  Chest 2 View  02/27/2016  CLINICAL DATA:  82 year old female with cough fever, chest congestion and pain since yesterday. Initial encounter. EXAM: CHEST  2 VIEW COMPARISON:  04/23/2011. FINDINGS: Right middle lobe consolidation. No pleural effusion. Right hilar contour appears stable. Other mediastinal contours are stable with borderline to mild cardiomegaly. Visualized tracheal air column is within normal limits. No pneumothorax or pulmonary edema. Incidental nipple shadows. Lung parenchyma elsewhere is stable and within normal limits. No acute osseous abnormality identified. IMPRESSION: Right middle lobe pneumonia.  No pleural effusion. Followup PA and lateral chest X-ray is recommended in 3-4 weeks following trial of antibiotic therapy to ensure resolution and exclude underlying malignancy. Electronically Signed   By: Genevie Ann M.D.   On: 02/27/2016 10:17   I have personally reviewed and evaluated these images and lab results as part of my medical decision-making.   EKG Interpretation   Date/Time:  Friday February 27 2016 14:09:57 EDT Ventricular Rate:  99 PR Interval:  207 QRS Duration: 92 QT Interval:  317 QTC Calculation: 407 R Axis:   15 Text Interpretation:  Sinus rhythm Borderline prolonged PR  interval  Abnormal R-wave progression, early transition Rate faster Confirmed by  Wyvonnia Dusky  MD, [place] [phone]) on 02/27/2016 2:18:34 PM      MDM   Final diagnoses:  Community acquired pneumonia   RML pneumonia on CXR.  Non-toxic appearing.  Mild tachycardia improved after resolution of fever and fluids.  Received albuterol nebs x 2, chest tightness improved.  IV rocephin and po zithromax given.    This is a shared visit, patient also seen by Dr. Wyvonnia Dusky and care plan discussed.   She has ambulated in the dept without hypoxia, but admits to generalized weakness.  Will consult for admission.   [facility] [place] [facility] who will admit the patient.    Kem Parkinson, PA-C 02/27/16 [place], MD 02/27/16 [phone]

## 2016-02-27 NOTE — H&P (Signed)
[facility] History and Physical  Candice Brewer BSJ:[id number] DOB: 04/03/1932 DOA: 02/27/2016   PCP: Robert Bellow, MD  Specialists: None  Chief Complaint: Right-sided chest pain and cough since yesterday  HPI: Candice Brewer is a 82 y.o. female with a past medical history of hypertension and diabetes who was in her usual state of health yesterday when she started developing pain in the right side of her chest as well as generalized body aches. She had a cough with yellowish expectoration. Had some difficulty breathing. Denies any blood in the sputum. She had a fever up to 101F. Denies any chills. No nausea or vomiting. No leg swelling. She visited her aunt about 2 weeks ago in the nursing facility and her aunt had pneumonia. She goes to the [facility] on a regular basis. She had 2 loose stools yesterday but none today. Denies any history of smoking currently. No history of recent hospitalization within the last 3 months. She states that she is usually in good health. Pain was 8 out of 10 intensity in the right side of the chest. It was worse with deep breathing and cough. Described as a sharp pain. Pain has improved significantly since she has been in the emergency department.  In the emergency department, patient's workup included blood work and x-ray. She was found to have leukocytosis. Chest x-ray showed right-sided pneumonia. Due to her some generalized weakness and the fact that she lives alone, she will be brought into the hospital for treatment.  Home Medications: Prior to Admission medications   Medication Sig Start Date End Date Taking? Authorizing Provider  acyclovir (ZOVIRAX) 800 MG tablet Take 800 mg by mouth daily. TAKE 1 TABLET TWICE A DAY 10/23/14  Yes Historical Provider, MD  aspirin 81 MG tablet Take 81 mg by mouth daily.     Yes Historical Provider, MD  Carboxymethylcellulose Sodium 0.25 % SOLN Apply 1 drop to eye at bedtime.    Yes Historical Provider, MD    Cholecalciferol (VITAMIN D3) 2000 UNITS TABS Take 2,000 Units by mouth daily.   Yes Historical Provider, MD  cloNIDine (CATAPRES) 0.1 MG tablet Take 0.1 mg by mouth 2 (two) times daily.     Yes Historical Provider, MD  enalapril (VASOTEC) 20 MG tablet Take 20 mg by mouth daily.     Yes Historical Provider, MD  latanoprost (XALATAN) 0.005 % ophthalmic solution INSTILL 1 DROP IN BOTH EYES NIGHTLY 12/31/14  Yes Historical Provider, MD  metFORMIN (GLUCOPHAGE) 500 MG tablet Take 500 mg by mouth 2 (two) times daily with a meal.    Yes Historical Provider, MD  Multiple Vitamin (MULTIVITAMIN) capsule Take 1 capsule by mouth daily.     Yes Historical Provider, MD  Polyethyl Glycol-Propyl Glycol 0.4-0.3 % SOLN Apply 1 drop to eye daily as needed (dry eyes).    Yes Historical Provider, MD  prednisoLONE acetate (PRED FORTE) 1 % ophthalmic suspension 1 drop in the right eye twice a day, 1 drop in the left eye twice a day on Mondays and Fridays. 10/09/12  Yes Historical Provider, MD    Allergies:  Allergies  Allergen Reactions  . Neomycin-Bacitracin Zn-Polymyx     REACTION: "Severe skin rash"  . Pravastatin Sodium     REACTION: "Funny feeling and leg pain"  . Rosuvastatin     REACTION: Rash    Past Medical History: Past Medical History  Diagnosis Date  . Chest pain, unspecified   . Restless legs syndrome (RLS)   . Fibroids  uterus  . Arthritis   . Constipation   . HTN (hypertension)   . DM (diabetes mellitus) ([place])   . Hyperlipidemia   . Esophageal stricture 09/2011    schatzki's ring  . Vitamin D deficiency   . Helicobacter pylori gastritis 2012    Treated    Past Surgical History  Procedure Laterality Date  . Appendectomy    . Tubal ligation    . Cardiovascular stress test  2009  . Cataract extraction  2010    right eye  . Esophagogastroduodenoscopy  06/2008    Dr. Lorrin Mais ring s/p disruption, erosive reflux esophagitis, hh.   . Colonoscopy  08/2006    Dr.  Vivi Ferns  . Cataract extraction  2012    left eye  . Colonoscopy  10/13/2011    Normal. Next TCS 09/2016.   Procedure: COLONOSCOPY;  Surgeon: Daneil Dolin, MD;  Location: [facility];  Service: Endoscopy;  Laterality: N/A;  8:15  . Esophagogastroduodenoscopy  09/2011    Schatki's ring s/p dilation, multiple 1-40m antral and bulbar erosions, bx positive for H.Pylori s/ treatment  . Colonoscopy N/A 03/10/2015    RMR: External and anal canal hemorrhoids likely source of hematochezia. Redundant colon. Single sigmoid polyp removed ad described above.     Social History: Patient lives by herself. No history of smoking or alcohol use. She has family who checks on her periodically. She is independent with daily activities.   Family History:  Family History  Problem Relation Age of Onset  . Prostate cancer Father   . Colon cancer Father     >age 80 . Cancer Paternal Grandmother     metastatic at time of diagnosis, primary unknown  . Cancer Paternal Aunt     metastatic at time of diagnosisi, primary unknown     Review of Systems - History obtained from the patient General ROS: positive for  - fatigue and fever Psychological ROS: negative Ophthalmic ROS: negative ENT ROS: negative Allergy and Immunology ROS: negative Hematological and Lymphatic ROS: negative Endocrine ROS: negative Respiratory ROS: as in hpi Cardiovascular ROS: as in hpi Gastrointestinal ROS: no abdominal pain, change in bowel habits, or black or bloody stools Genito-Urinary ROS: no dysuria, trouble voiding, or hematuria Musculoskeletal ROS: negative Neurological ROS: no TIA or stroke symptoms Dermatological ROS: negative  Physical Examination  Filed Vitals:   02/27/16 1500 02/27/16 1515 02/27/16 1530 02/27/16 1606  BP:  120/96 152/90   Pulse: 106 110 100   Temp:    99 F (37.2 C)  TempSrc:    Oral  Resp: _0 Height:      Weight:      SpO2: 97% 97% 97%     BP 152/90 mmHg  Pulse 100   Temp(Src) 99 F (37.2 C) (Oral)  Resp 21  Ht _1  (1.702 m)  Wt 67.132 kg (148 lb)  BMI 23.17 kg/m2  SpO2 97%  General appearance: alert, cooperative, appears stated age and no distress Head: Normocephalic, without obvious abnormality, atraumatic Eyes: conjunctivae/corneas clear. PERRL, EOM's intact. Throat: lips, mucosa, and tongue normal; teeth and gums normal Neck: no adenopathy, no carotid bruit, no JVD, supple, symmetrical, trachea midline and thyroid not enlarged, symmetric, no tenderness/mass/nodules Resp: Crackles present. Right base. Few crackles left base as well. No rhonchi. No wheezing. Cardio: regular rate and rhythm, S1, S2 normal, no murmur, click, rub or gallop GI: soft, non-tender; bowel sounds normal; no masses,  no organomegaly Extremities: extremities normal, atraumatic,  no cyanosis or edema Pulses: 2+ and symmetric Skin: Skin color, texture, turgor normal. No rashes or lesions Lymph nodes: Cervical, supraclavicular, and axillary nodes normal. Neurologic: Alert and oriented 3. No focal neurological deficits are present.  Laboratory Data: Results for orders placed or performed during the hospital encounter of 02/27/16 (from the past 48 hour(s))  I-Stat CG4 Lactic Acid, ED     Status: None   Collection Time: 02/27/16 11:40 AM  Result Value Ref Range   Lactic Acid, Venous 1.04 0.5 - 2.0 mmol/L  CBC with Differential     Status: Abnormal   Collection Time: 02/27/16 11:50 AM  Result Value Ref Range   WBC 15.2 (H) 4.0 - 10.5 K/uL   RBC 3.52 (L) 3.87 - 5.11 MIL/uL   Hemoglobin 12.0 12.0 - 15.0 g/dL   HCT 34.4 (L) 36.0 - 46.0 %   MCV 97.7 78.0 - 100.0 fL   MCH 34.1 (H) 26.0 - 34.0 pg   MCHC 34.9 30.0 - 36.0 g/dL   RDW 13.1 11.5 - 15.5 %   Platelets 169 150 - 400 K/uL   Neutrophils Relative % 82 %   Neutro Abs 12.5 (H) 1.7 - 7.7 K/uL   Lymphocytes Relative 11 %   Lymphs Abs 1.7 0.7 - 4.0 K/uL   Monocytes Relative 7 %   Monocytes Absolute 1.0 0.1 - 1.0 K/uL    Eosinophils Relative 0 %   Eosinophils Absolute 0.0 0.0 - 0.7 K/uL   Basophils Relative 0 %   Basophils Absolute 0.0 0.0 - 0.1 K/uL  Comprehensive metabolic panel     Status: Abnormal   Collection Time: 02/27/16 11:50 AM  Result Value Ref Range   Sodium 134 (L) 135 - 145 mmol/L   Potassium 3.6 3.5 - 5.1 mmol/L   Chloride 101 101 - 111 mmol/L   CO2 23 22 - 32 mmol/L   Glucose, Bld 185 (H) 65 - 99 mg/dL   BUN 14 6 - 20 mg/dL   Creatinine, Ser 0.90 0.44 - 1.00 mg/dL   Calcium 8.9 8.9 - 10.3 mg/dL   Total Protein 7.4 6.5 - 8.1 g/dL   Albumin 3.9 3.5 - 5.0 g/dL   AST 21 15 - 41 U/L   ALT 15 14 - 54 U/L   Alkaline Phosphatase 71 38 - 126 U/L   Total Bilirubin 1.0 0.3 - 1.2 mg/dL   GFR calc non Af Amer 58 (L) >60 mL/min   GFR calc Af Amer >60 >60 mL/min    Comment: (NOTE) The eGFR has been calculated using the CKD EPI equation. This calculation has not been validated in all clinical situations. eGFR's persistently <60 mL/min signify possible Chronic Kidney Disease.    Anion gap 10 5 - 15  Blood culture (routine x 2)     Status: None (Preliminary result)   Collection Time: 02/27/16 11:51 AM  Result Value Ref Range   Specimen Description BLOOD RIGHT ARM    Special Requests BOTTLES DRAWN AEROBIC AND ANAEROBIC 10CC EACH    Culture PENDING    Report Status PENDING   Influenza panel by PCR (type A & B, H1N1)     Status: None   Collection Time: 02/27/16 11:56 AM  Result Value Ref Range   Influenza A By PCR NEGATIVE NEGATIVE   Influenza B By PCR NEGATIVE NEGATIVE   H1N1 flu by pcr NOT DETECTED NOT DETECTED    Comment:        The Xpert Flu assay (FDA approved for  nasal aspirates or washes and nasopharyngeal swab specimens), is intended as an aid in the diagnosis of influenza and should not be used as a sole basis for treatment.   Troponin I     Status: None   Collection Time: 02/27/16 11:56 AM  Result Value Ref Range   Troponin I <0.03 <0.031 ng/mL    Comment:        NO  INDICATION OF MYOCARDIAL INJURY.   Blood culture (routine x 2)     Status: None (Preliminary result)   Collection Time: 02/27/16 12:31 PM  Result Value Ref Range   Specimen Description BLOOD RIGHT ARM    Special Requests BOTTLES DRAWN AEROBIC AND ANAEROBIC 8CC EACH    Culture PENDING    Report Status PENDING     Radiology Reports: Dg Chest 2 View  02/27/2016  CLINICAL DATA:  82 year old female with cough fever, chest congestion and pain since yesterday. Initial encounter. EXAM: CHEST  2 VIEW COMPARISON:  04/23/2011. FINDINGS: Right middle lobe consolidation. No pleural effusion. Right hilar contour appears stable. Other mediastinal contours are stable with borderline to mild cardiomegaly. Visualized tracheal air column is within normal limits. No pneumothorax or pulmonary edema. Incidental nipple shadows. Lung parenchyma elsewhere is stable and within normal limits. No acute osseous abnormality identified. IMPRESSION: Right middle lobe pneumonia.  No pleural effusion. Followup PA and lateral chest X-ray is recommended in 3-4 weeks following trial of antibiotic therapy to ensure resolution and exclude underlying malignancy. Electronically Signed   By: Genevie Ann M.D.   On: 02/27/2016 10:17    My interpretation of Electrocardiogram: Sinus rhythm in the 80s. Left axis deviation. No concerning ST or T-wave changes.  Problem List  Principal Problem:   CAP (community acquired pneumonia) Active Problems:   DM type 2 (diabetes mellitus, type 2) ([place])   Essential hypertension   Pneumonia   Pleuritic chest pain   Assessment: This is a 82 year old African-American female with a past medical history of hypertension and diabetes who comes in with a one-day history of cough, right-sided chest pain and shortness of breath. She was febrile in the emergency department. Chest x-ray shows pneumonia. Patient will be hospitalized for treatment of community-acquired pneumonia. She has generalized weakness. She  lives by herself.  Plan: #1 Community acquired pneumonia: She will be admitted to the hospital and started on ceftriaxone and azithromycin. She'll be given oxygen as needed, although she is not hypoxic currently. Lactic acid is normal. Follow-up on urine for strep and legionella antigens. Influenza PCR was negative. Blood cultures have been sent in the emergency department.  #2 right-sided pleuritic chest pain: Most likely secondary to pneumonia. EKG does not show any ischemic changes. Troponin is normal. Pain control.  #3 Essential hypertension: Continue home medications with holding parameter is. Monitor blood pressures closely.  #4 diabetes mellitus type 2 on oral agents at home: Sliding scale will be initiated.   DVT Prophylaxis: Lovenox Code Status: Discussed with the patient in detail. She does not want CPR or life support. She is DO NOT RESUSCITATE. Family Communication: Discussed with the patient. No family at bedside  Disposition Plan: Admit to MedSurg   Further management decisions will depend on results of further testing and patient's response to treatment.   [facility]  [facility] Pager [phone]  If 7PM-7AM, please contact night-coverage [email or web address] Password [email or web address]  02/27/2016, 4:18 PM

## 2016-02-27 NOTE — ED Notes (Signed)
CBG did not cross over from Glucometer, 194.

## 2016-02-27 NOTE — ED Notes (Signed)
PT c/o productive thick sputum with fever, body aches, chills x1 day. PT denies any medications today.

## 2016-02-27 NOTE — ED Provider Notes (Signed)
By signing my name below, I, Terressa Koyanagi, attest that this documentation has been prepared under the direction and in the presence of Ezequiel Essex, MD. Electronically Signed: Terressa Koyanagi, ED Scribe. 02/27/2016. 10:59 AM. Medical screening examination/treatment/procedure(s) were conducted as a shared visit with non-physician practitioner(s) and myself.  I personally evaluated the patient during the encounter.   EKG Interpretation   Date/Time:  Friday February 27 2016 10:45:58 EDT Ventricular Rate:  85 PR Interval:  186 QRS Duration: 82 QT Interval:  348 QTC Calculation: 414 R Axis:   -18 Text Interpretation:  Normal sinus rhythm Normal ECG No significant change  was found Confirmed by Wyvonnia Dusky  MD, Melrose Kearse [phone]) on 02/27/2016 10:58:12  AM      HPI Comments: Candice Brewer is a 82 y.o. female who presents to the Emergency Department complaining of productive cough with thick yellow/green sputum onset 2 days ago. Associated Sx include fever, body aches, chills, decreased appetite, and chest wall tenderness secondary to coughing. Pt denies taking any meds for her Sx today. Pt reports getting a flu shot this season. Pt denies any Hx of cardiac problems. Pt denies tobacco use. Pt denies n/v, diarrhea.   PHYSICAL  Mildly ill appearing, no distress.  Tachycardic around 100, decreased breath sounds on right.   COORDINATION OF CARE: 10:56 AM: Discussed treatment plan which includes imaging results, meds, and possible hospital admission with pt at bedside; patient verbalizes understanding and agrees with treatment plan.  PNA on Xray.  Mild tachycardia and tachypnea. No hypoxia with ambulation. PORT score category 3.  Patient requesting admission.   I personally performed the services described in this documentation, which was scribed in my presence. The recorded information has been reviewed and is accurate.    Ezequiel Essex, MD 02/27/16 [id number]

## 2016-02-28 ENCOUNTER — Encounter (HOSPITAL_COMMUNITY): Payer: Self-pay | Admitting: *Deleted

## 2016-02-28 DIAGNOSIS — E871 Hypo-osmolality and hyponatremia: Secondary | ICD-10-CM | POA: Diagnosis present

## 2016-02-28 DIAGNOSIS — I10 Essential (primary) hypertension: Secondary | ICD-10-CM | POA: Diagnosis not present

## 2016-02-28 DIAGNOSIS — J189 Pneumonia, unspecified organism: Secondary | ICD-10-CM | POA: Diagnosis not present

## 2016-02-28 DIAGNOSIS — E785 Hyperlipidemia, unspecified: Secondary | ICD-10-CM | POA: Diagnosis present

## 2016-02-28 DIAGNOSIS — Z66 Do not resuscitate: Secondary | ICD-10-CM | POA: Diagnosis present

## 2016-02-28 DIAGNOSIS — G2581 Restless legs syndrome: Secondary | ICD-10-CM | POA: Diagnosis present

## 2016-02-28 DIAGNOSIS — Z7984 Long term (current) use of oral hypoglycemic drugs: Secondary | ICD-10-CM | POA: Diagnosis not present

## 2016-02-28 DIAGNOSIS — J15212 Pneumonia due to Methicillin resistant Staphylococcus aureus: Secondary | ICD-10-CM | POA: Diagnosis present

## 2016-02-28 DIAGNOSIS — R05 Cough: Secondary | ICD-10-CM | POA: Diagnosis present

## 2016-02-28 DIAGNOSIS — D649 Anemia, unspecified: Secondary | ICD-10-CM | POA: Diagnosis present

## 2016-02-28 DIAGNOSIS — Z800 Family history of malignant neoplasm of digestive organs: Secondary | ICD-10-CM | POA: Diagnosis not present

## 2016-02-28 DIAGNOSIS — R0781 Pleurodynia: Secondary | ICD-10-CM | POA: Diagnosis not present

## 2016-02-28 DIAGNOSIS — Z7982 Long term (current) use of aspirin: Secondary | ICD-10-CM | POA: Diagnosis not present

## 2016-02-28 DIAGNOSIS — E119 Type 2 diabetes mellitus without complications: Secondary | ICD-10-CM | POA: Diagnosis not present

## 2016-02-28 DIAGNOSIS — A4901 Methicillin susceptible Staphylococcus aureus infection, unspecified site: Secondary | ICD-10-CM | POA: Diagnosis not present

## 2016-02-28 LAB — BASIC METABOLIC PANEL
ANION GAP: 7 (ref 5–15)
BUN: 11 mg/dL (ref 6–20)
CHLORIDE: 104 mmol/L (ref 101–111)
CO2: 23 mmol/L (ref 22–32)
Calcium: 8.5 mg/dL — ABNORMAL LOW (ref 8.9–10.3)
Creatinine, Ser: 0.83 mg/dL (ref 0.44–1.00)
GFR calc Af Amer: >60 mL/min (ref >60)
GFR calc non Af Amer: >60 mL/min (ref >60)
GLUCOSE: 222 mg/dL — AB (ref 65–99)
POTASSIUM: 3.8 mmol/L (ref 3.5–5.1)
Sodium: 134 mmol/L — ABNORMAL LOW (ref 135–145)

## 2016-02-28 LAB — CBC
HEMATOCRIT: 33.6 % — AB (ref 36.0–46.0)
Hemoglobin: 11.4 g/dL — ABNORMAL LOW (ref 12.0–15.0)
MCH: 33.2 pg (ref 26.0–34.0)
MCHC: 33.9 g/dL (ref 30.0–36.0)
MCV: 98 fL (ref 78.0–100.0)
PLATELETS: 160 10*3/uL (ref 150–400)
RBC: 3.43 MIL/uL — AB (ref 3.87–5.11)
RDW: 13.3 % (ref 11.5–15.5)
WBC: 18 10*3/uL — AB (ref 4.0–10.5)

## 2016-02-28 LAB — GLUCOSE, CAPILLARY
GLUCOSE-CAPILLARY: 184 mg/dL — AB (ref 65–99)
Glucose-Capillary: 209 mg/dL — ABNORMAL HIGH (ref 65–99)

## 2016-02-28 LAB — EXPECTORATED SPUTUM ASSESSMENT W REFEX TO RESP CULTURE

## 2016-02-28 LAB — EXPECTORATED SPUTUM ASSESSMENT W GRAM STAIN, RFLX TO RESP C

## 2016-02-28 LAB — CBG MONITORING, ED
Glucose-Capillary: 173 mg/dL — ABNORMAL HIGH (ref 65–99)
Glucose-Capillary: 206 mg/dL — ABNORMAL HIGH (ref 65–99)

## 2016-02-28 MED ORDER — BRIMONIDINE TARTRATE-TIMOLOL 0.2-0.5 % OP SOLN: 1.0000 [drp] | Freq: Two times a day (BID) | OPHTHALMIC | Status: DC

## 2016-02-28 MED ORDER — BRIMONIDINE TARTRATE 0.2 % OP SOLN
OPHTHALMIC | Status: AC
  Filled 2016-02-28: qty 5

## 2016-02-28 MED ORDER — BRIMONIDINE TARTRATE 0.2 % OP SOLN
1.0000 [drp] | Freq: Two times a day (BID) | OPHTHALMIC | Status: DC
Start: 2016-02-28 — End: 2016-03-04
  Administered 2016-02-28 – 2016-03-04 (×10): 1 [drp] via OPHTHALMIC
  Filled 2016-02-28: qty 5

## 2016-02-28 MED ORDER — TIMOLOL MALEATE 0.5 % OP SOLN
1.0000 [drp] | Freq: Two times a day (BID) | OPHTHALMIC | Status: DC
  Administered 2016-02-28 – 2016-03-04 (×10): 1 [drp] via OPHTHALMIC
  Filled 2016-02-28 (×3): qty 5

## 2016-02-28 MED ORDER — INSULIN GLARGINE 100 UNIT/ML ~~LOC~~ SOLN
10.0000 [IU] | Freq: Two times a day (BID) | SUBCUTANEOUS | Status: DC
  Administered 2016-02-28 – 2016-03-04 (×11): 10 [IU] via SUBCUTANEOUS
  Filled 2016-02-28 (×13): qty 0.1

## 2016-02-28 MED ORDER — BENZONATATE 100 MG PO CAPS
100.0000 mg | ORAL_CAPSULE | Freq: Three times a day (TID) | ORAL | Status: DC
  Administered 2016-02-28 – 2016-03-04 (×16): 100 mg via ORAL
  Filled 2016-02-28 (×16): qty 1

## 2016-02-28 MED ORDER — ACYCLOVIR 200 MG PO CAPS
ORAL_CAPSULE | ORAL | Status: AC
  Filled 2016-02-28: qty 4

## 2016-02-28 MED ORDER — POTASSIUM CHLORIDE IN NACL 20-0.9 MEQ/L-% IV SOLN
INTRAVENOUS | Status: DC
Start: 2016-02-28 — End: 2016-03-04
  Administered 2016-02-28 – 2016-03-04 (×6): via INTRAVENOUS
  Filled 2016-02-28: qty 1000

## 2016-02-28 NOTE — Progress Notes (Signed)
[facility] PROGRESS NOTE  Candice Brewer [id number] DOB: December 17, 1931 DOA: 02/27/2016 PCP: Robert Bellow, MD   Code Status: DO NOT RESUSCITATE Family Communication: Discussed with patient; family not available Disposition Plan: Discharged to home when clinically appropriate, possibly in the next 24-48 hours   Consultants:  None  Procedures:  None  Antibiotics:  Azithromycin 02/27/16  Rocephin 02/27/16  HPI/Subjective: Patient says that she feels a little better. Pleuritic chest pain has subsided, but tends to intensify with coughing.  Objective: Filed Vitals:   02/28/16 0230 02/28/16 0802  BP:  151/77  Pulse: 88 92  Temp:    Resp: 14 28  Oxygen saturation 98% on room air temperature 101.6 No intake or output data in the 24 hours ending 02/28/16 0815 Filed Weights   02/27/16 0951  Weight: 67.132 kg (148 lb)    Exam:   General:  Pleasant alert 82 year old African-American woman in no acute distress.  Cardiovascular: S1, S2, no murmurs rubs or gallops.  Respiratory: Breathing nonlabored at rest. Right-sided crackles; lungs clear on the left.  Abdomen: Positive bowel sounds, soft, nontender, nondistended.  Musculoskeletal/extremities: No acute hot red joints. No pedal edema.  Neurologic: She is alert and oriented 2. Cranial nerves II through XII are grossly intact.   Data Reviewed: Basic Metabolic Panel:  Recent Labs Lab 02/27/16 1150 02/28/16 0514  NA 134* 134*  K 3.6 3.8  CL 101 104  CO2 23 23  GLUCOSE 185* 222*  BUN 14 11  CREATININE 0.90 0.83  CALCIUM 8.9 8.5*   Liver Function Tests:  Recent Labs Lab 02/27/16 1150  AST 21  ALT 15  ALKPHOS 71  BILITOT 1.0  PROT 7.4  ALBUMIN 3.9   No results for input(s): LIPASE, AMYLASE in the last 168 hours. No results for input(s): AMMONIA in the last 168 hours. CBC:  Recent Labs Lab 02/27/16 1150 02/28/16 0514  WBC 15.2* 18.0*  NEUTROABS 12.5*  --   HGB 12.0 11.4*  HCT 34.4*  33.6*  MCV 97.7 98.0  PLT 169 160   Cardiac Enzymes:  Recent Labs Lab 02/27/16 1156  TROPONINI <0.03   BNP (last 3 results) No results for input(s): BNP in the last 8760 hours.  ProBNP (last 3 results) No results for input(s): PROBNP in the last 8760 hours.  CBG:  Recent Labs Lab 02/27/16 2237 02/28/16 0750  GLUCAP 194* 173*    Recent Results (from the past 240 hour(s))  Blood culture (routine x 2)     Status: None (Preliminary result)   Collection Time: 02/27/16 11:51 AM  Result Value Ref Range Status   Specimen Description BLOOD RIGHT ARM  Final   Special Requests BOTTLES DRAWN AEROBIC AND ANAEROBIC 10CC EACH  Final   Culture PENDING  Incomplete   Report Status PENDING  Incomplete  Blood culture (routine x 2)     Status: None (Preliminary result)   Collection Time: 02/27/16 12:31 PM  Result Value Ref Range Status   Specimen Description BLOOD RIGHT ARM  Final   Special Requests BOTTLES DRAWN AEROBIC AND ANAEROBIC 8CC EACH  Final   Culture PENDING  Incomplete   Report Status PENDING  Incomplete     Studies: Dg Chest 2 View  02/27/2016  CLINICAL DATA:  82 year old female with cough fever, chest congestion and pain since yesterday. Initial encounter. EXAM: CHEST  2 VIEW COMPARISON:  04/23/2011. FINDINGS: Right middle lobe consolidation. No pleural effusion. Right hilar contour appears stable. Other mediastinal contours are stable with borderline to  mild cardiomegaly. Visualized tracheal air column is within normal limits. No pneumothorax or pulmonary edema. Incidental nipple shadows. Lung parenchyma elsewhere is stable and within normal limits. No acute osseous abnormality identified. IMPRESSION: Right middle lobe pneumonia.  No pleural effusion. Followup PA and lateral chest X-ray is recommended in 3-4 weeks following trial of antibiotic therapy to ensure resolution and exclude underlying malignancy. Electronically Signed   By: Genevie Ann M.D.   On: 02/27/2016 10:17     Scheduled Meds: . acyclovir  800 mg Oral BID  . aspirin  81 mg Oral Daily  . azithromycin  500 mg Oral Daily  . cloNIDine  0.1 mg Oral BID  . enalapril  20 mg Oral Daily  . enoxaparin (LOVENOX) injection  40 mg Subcutaneous Q24H  . guaiFENesin  600 mg Oral BID  . insulin aspart  0-9 Units Subcutaneous TID WC  . latanoprost  1 drop Both Eyes QHS  . multivitamin with minerals  1 tablet Oral Daily  . prednisoLONE acetate  1 drop Both Eyes BID   Continuous Infusions: . cefTRIAXone (ROCEPHIN)  IV Stopped (02/27/16 1335)   Assessment and plan: Principal Problem:   CAP (community acquired pneumonia) Active Problems:   DM type 2 (diabetes mellitus, type 2) ([place])   Essential hypertension   Pneumonia   Pleuritic chest pain   1. Community-acquired pneumonia. On admission, the patient was febrile with temperature 101.5. Her chest x-ray revealed right middle lobe pneumonia. She was started on Rocephin and azithromycin. Oxygen was applied to keep her oxygen saturations greater than 90%. Guaifenesin was ordered twice a day.  -Her white blood cell count increased from yesterday and she is still febrile. Will monitor for possible escalation in antibiotic therapy. -We'll order Tessalon Perles for cough. Otherwise continue current management. -Strep pneumo antigen and Legionella antigen and blood cultures pending.  Type 2 diabetes mellitus.  Patient is on metformin chronically. Metformin was held on admission. Sliding scale NovoLog was started. -We will add Lantus due to elevated CBGs. Will order hemoglobin A1c.  Hypertension. Patient is treated chronically with enalapril and clonidine. They were continued on admission. Her blood pressure is stable.  Mild hyponatremia. We'll continue gentle normal saline.  Mild normocytic anemia.  Likely dilutional from IV fluids. We'll continue to monitor.    Time spent: 35 minutes.    [facility] Pager [phone]. If  7PM-7AM, please contact night-coverage at [email or web address], password [facility] 02/28/2016, 8:15 AM

## 2016-02-28 NOTE — Progress Notes (Signed)
Pharmacy Note:  Renal adjust antibiotics Candice Brewer was started on azithromycin and rocephin for CAP.  Neither require dosage adjustment for renal function. Pharmacy will sign off,  Please re-consult as needed.  Thank you, Excell Seltzer, PharmD

## 2016-02-28 NOTE — Progress Notes (Signed)
1920 Patient requesting order for Combigan eye gtts that she takes at home and are listed on her PTA med list. Mid-level notified and order requested. Patient and patient's son aware.

## 2016-02-29 LAB — GLUCOSE, CAPILLARY
GLUCOSE-CAPILLARY: 146 mg/dL — AB (ref 65–99)
GLUCOSE-CAPILLARY: 203 mg/dL — AB (ref 65–99)
Glucose-Capillary: 189 mg/dL — ABNORMAL HIGH (ref 65–99)
Glucose-Capillary: 164 mg/dL — ABNORMAL HIGH (ref 65–99)

## 2016-02-29 LAB — CBC
HCT: 33.3 % — ABNORMAL LOW (ref 36.0–46.0)
HEMOGLOBIN: 11.3 g/dL — AB (ref 12.0–15.0)
MCH: 33.3 pg (ref 26.0–34.0)
MCHC: 33.9 g/dL (ref 30.0–36.0)
MCV: 98.2 fL (ref 78.0–100.0)
PLATELETS: 177 10*3/uL (ref 150–400)
RBC: 3.39 MIL/uL — AB (ref 3.87–5.11)
RDW: 13.4 % (ref 11.5–15.5)
WBC: 15.9 10*3/uL — ABNORMAL HIGH (ref 4.0–10.5)

## 2016-02-29 LAB — BASIC METABOLIC PANEL
Anion gap: 8 (ref 5–15)
BUN: 11 mg/dL (ref 6–20)
CHLORIDE: 104 mmol/L (ref 101–111)
CO2: 22 mmol/L (ref 22–32)
CREATININE: 0.79 mg/dL (ref 0.44–1.00)
Calcium: 8.6 mg/dL — ABNORMAL LOW (ref 8.9–10.3)
Glucose, Bld: 154 mg/dL — ABNORMAL HIGH (ref 65–99)
Potassium: 3.8 mmol/L (ref 3.5–5.1)
SODIUM: 134 mmol/L — AB (ref 135–145)

## 2016-02-29 LAB — STREP PNEUMONIAE URINARY ANTIGEN: STREP PNEUMO URINARY ANTIGEN: NEGATIVE

## 2016-02-29 LAB — HIV ANTIBODY (ROUTINE TESTING W REFLEX): HIV SCREEN 4TH GENERATION: NONREACTIVE

## 2016-02-29 MED ORDER — ALBUTEROL SULFATE (2.5 MG/3ML) 0.083% IN NEBU
2.5000 mg | INHALATION_SOLUTION | Freq: Three times a day (TID) | RESPIRATORY_TRACT | Status: DC
  Administered 2016-02-29 – 2016-03-03 (×11): 2.5 mg via RESPIRATORY_TRACT
  Filled 2016-02-29 (×12): qty 3

## 2016-02-29 NOTE — Progress Notes (Signed)
[facility] PROGRESS NOTE  Candice Brewer [id number] DOB: 1932/04/19 DOA: 02/27/2016 PCP: Robert Bellow, MD   Code Status: DO NOT RESUSCITATE Family Communication: Discussed with Son-in-law Disposition Plan: Discharged to home when clinically appropriate, possibly in the next 48 hours   Consultants:  None  Procedures:  None  Antibiotics:  Azithromycin 02/27/16>>  Rocephin 02/27/16>>   HPI/Subjective: Patient feels short of breath when she gets up to walk. Her cough is still present, but not as bad. It still hurts her chest when she coughs.  Objective: Filed Vitals:   02/28/16 2157 02/29/16 0645  BP: 149/89 145/80  Pulse: 94 91  Temp: 99.7 F (37.6 C) 99 F (37.2 C)  Resp: 20 20  Oxygen saturation 97% on room air temperature  99.0.  Intake/Output Summary (Last 24 hours) at 02/29/16 1334 Last data filed at 02/29/16 1255  Gross per 24 hour  Intake    900 ml  Output    250 ml  Net    650 ml   Filed Weights   02/27/16 0951 02/28/16 1620  Weight: 67.132 kg (148 lb) 69.8 kg (153 lb 14.1 oz)    Exam:   General:  Pleasant alert 82 year old African-American woman in no acute distress.  Cardiovascular: S1, S2, no murmurs rubs or gallops.  Respiratory: Breathing mildly labored; Right-sided crackles;   Abdomen: Positive bowel sounds, soft, nontender, nondistended.  Musculoskeletal/extremities: No acute hot red joints. No pedal edema.  Neurologic: She is alert and oriented 2. Cranial nerves II through XII are grossly intact.   Data Reviewed: Basic Metabolic Panel:  Recent Labs Lab 02/27/16 1150 02/28/16 0514 02/29/16 0638  NA 134* 134* 134*  K 3.6 3.8 3.8  CL 101 104 104  CO2 23 23 22   GLUCOSE 185* 222* 154*  BUN 14 11 11   CREATININE 0.90 0.83 0.79  CALCIUM 8.9 8.5* 8.6*   Liver Function Tests:  Recent Labs Lab 02/27/16 1150  AST 21  ALT 15  ALKPHOS 71  BILITOT 1.0  PROT 7.4  ALBUMIN 3.9   No results for input(s): LIPASE,  AMYLASE in the last 168 hours. No results for input(s): AMMONIA in the last 168 hours. CBC:  Recent Labs Lab 02/27/16 1150 02/28/16 0514 02/29/16 0638  WBC 15.2* 18.0* 15.9*  NEUTROABS 12.5*  --   --   HGB 12.0 11.4* 11.3*  HCT 34.4* 33.6* 33.3*  MCV 97.7 98.0 98.2  PLT 169 160 177   Cardiac Enzymes:  Recent Labs Lab 02/27/16 1156  TROPONINI <0.03   BNP (last 3 results) No results for input(s): BNP in the last 8760 hours.  ProBNP (last 3 results) No results for input(s): PROBNP in the last 8760 hours.  CBG:  Recent Labs Lab 02/28/16 1051 02/28/16 1640 02/28/16 2155 02/29/16 0813 02/29/16 1134  GLUCAP 206* 209* 184* 146* 203*    Recent Results (from the past 240 hour(s))  Blood culture (routine x 2)     Status: None (Preliminary result)   Collection Time: 02/27/16 11:51 AM  Result Value Ref Range Status   Specimen Description BLOOD RIGHT ARM  Final   Special Requests BOTTLES DRAWN AEROBIC AND ANAEROBIC 10CC EACH  Final   Culture NO GROWTH 2 DAYS  Final   Report Status PENDING  Incomplete  Blood culture (routine x 2)     Status: None (Preliminary result)   Collection Time: 02/27/16 12:31 PM  Result Value Ref Range Status   Specimen Description BLOOD RIGHT ARM  Final   Special  Requests BOTTLES DRAWN AEROBIC AND ANAEROBIC 8CC EACH  Final   Culture NO GROWTH 2 DAYS  Final   Report Status PENDING  Incomplete  Culture, sputum-assessment     Status: None   Collection Time: 02/28/16  1:38 PM  Result Value Ref Range Status   Specimen Description TRACHEAL SITE  Final   Special Requests NONE  Final   Sputum evaluation THIS SPECIMEN IS ACCEPTABLE FOR SPUTUM CULTURE  Final   Report Status 02/28/2016 FINAL  Final  Culture, respiratory (NON-Expectorated)     Status: None (Preliminary result)   Collection Time: 02/28/16  1:38 PM  Result Value Ref Range Status   Specimen Description SPUTUM  Final   Special Requests NONE  Final   Gram Stain PENDING  Incomplete    Culture   Final    Culture reincubated for better growth Performed at [facility]    Report Status PENDING  Incomplete     Studies: No results found.  Scheduled Meds: . acyclovir  800 mg Oral BID  . albuterol  2.5 mg Nebulization 3 times per day  . aspirin  81 mg Oral Daily  . azithromycin  500 mg Oral Daily  . benzonatate  100 mg Oral TID  . brimonidine  1 drop Both Eyes BID  . cefTRIAXone (ROCEPHIN)  IV  1 g Intravenous Q24H  . cloNIDine  0.1 mg Oral BID  . enalapril  20 mg Oral Daily  . enoxaparin (LOVENOX) injection  40 mg Subcutaneous Q24H  . guaiFENesin  600 mg Oral BID  . insulin aspart  0-9 Units Subcutaneous TID WC  . insulin glargine  10 Units Subcutaneous BID  . latanoprost  1 drop Both Eyes QHS  . multivitamin with minerals  1 tablet Oral Daily  . prednisoLONE acetate  1 drop Both Eyes BID  . timolol  1 drop Both Eyes BID   Continuous Infusions: . 0.9 % NaCl with KCl 20 mEq / L 50 mL/hr at 02/29/16 [id number]   Assessment and plan: Principal Problem:   CAP (community acquired pneumonia) Active Problems:   Type 2 diabetes mellitus without complication ([place])   Essential hypertension   Pneumonia   Pleuritic chest pain   1. Community-acquired pneumonia. On admission, the patient was febrile with temperature 101.5. Her chest x-ray revealed right middle lobe pneumonia. She was started on Rocephin and azithromycin. Oxygen was applied to keep her oxygen saturations greater than 90%. Guaifenesin was ordered twice a day.  Tessalon Perles was added for cough. As needed nebulizers ordered for possible bronchospasms. -Patient feels better with "breathing treatments", so will schedule albuterol every 8 hours. -Her white blood cell count is trending downward. -Strep pneumo antigen and Legionella antigen are pending. Sputum culture pending. Blood cultures negative to date.  Type 2 diabetes mellitus.  Patient is on metformin chronically. Metformin was held on  admission. Sliding scale NovoLog and Lantus were started. Hemoglobin A1c pending.  Hypertension. Patient is treated chronically with enalapril and clonidine. They were continued on admission. Her blood pressure is stable.  Mild hyponatremia. We'll continue gentle normal saline.  Mild normocytic anemia.  Likely dilutional from IV fluids. We'll continue to monitor.    Time spent: 30 minutes.    [facility] Pager [phone]. If 7PM-7AM, please contact night-coverage at [email or web address], password [facility] 02/29/2016, 1:34 PM  LOS: 1 day

## 2016-03-01 ENCOUNTER — Inpatient Hospital Stay (HOSPITAL_COMMUNITY): Payer: Medicare Other

## 2016-03-01 DIAGNOSIS — A4901 Methicillin susceptible Staphylococcus aureus infection, unspecified site: Secondary | ICD-10-CM | POA: Diagnosis present

## 2016-03-01 LAB — CBC
HEMATOCRIT: 32.9 % — AB (ref 36.0–46.0)
Hemoglobin: 11.2 g/dL — ABNORMAL LOW (ref 12.0–15.0)
MCH: 33.5 pg (ref 26.0–34.0)
MCHC: 34 g/dL (ref 30.0–36.0)
MCV: 98.5 fL (ref 78.0–100.0)
Platelets: 200 10*3/uL (ref 150–400)
RBC: 3.34 MIL/uL — AB (ref 3.87–5.11)
RDW: 12.8 % (ref 11.5–15.5)
WBC: 12.9 10*3/uL — AB (ref 4.0–10.5)

## 2016-03-01 LAB — MRSA PCR SCREENING: MRSA BY PCR: NEGATIVE

## 2016-03-01 LAB — GLUCOSE, CAPILLARY
GLUCOSE-CAPILLARY: 188 mg/dL — AB (ref 65–99)
Glucose-Capillary: 128 mg/dL — ABNORMAL HIGH (ref 65–99)

## 2016-03-01 MED ORDER — MAGNESIUM HYDROXIDE 400 MG/5ML PO SUSP
10.0000 mL | Freq: Once | ORAL | Status: AC
  Administered 2016-03-01: 10 mL via ORAL
  Filled 2016-03-01: qty 30

## 2016-03-01 MED ORDER — VANCOMYCIN HCL 10 G IV SOLR
1250.0000 mg | Freq: Once | INTRAVENOUS | Status: AC
  Administered 2016-03-01: 1250 mg via INTRAVENOUS
  Filled 2016-03-01: qty 1250

## 2016-03-01 MED ORDER — VANCOMYCIN HCL IN DEXTROSE 750-5 MG/150ML-% IV SOLN
750.0000 mg | Freq: Two times a day (BID) | INTRAVENOUS | Status: DC
  Administered 2016-03-02 – 2016-03-04 (×5): 750 mg via INTRAVENOUS
  Filled 2016-03-01 (×7): qty 150

## 2016-03-01 MED ORDER — SENNOSIDES-DOCUSATE SODIUM 8.6-50 MG PO TABS
1.0000 | ORAL_TABLET | Freq: Two times a day (BID) | ORAL | Status: DC
  Administered 2016-03-01 – 2016-03-04 (×6): 1 via ORAL
  Filled 2016-03-01 (×5): qty 1

## 2016-03-01 NOTE — Evaluation (Signed)
Physical Therapy Evaluation Patient Details Name: Candice Brewer MRN: [id number] DOB: 1932-01-26 Today's Date: 03/01/2016   History of Present Illness  Candice Brewer is a 82 y.o. female with a past medical history of hypertension and diabetes who was in her usual state of health yesterday when she started developing pain in the right side of her chest as well as generalized body aches. She had a cough with yellowish expectoration. Had some difficulty breathing. Denies any blood in the sputum. She had a fever up to 101F.  Clinical Impression  Pt normally ambulates without an assistive device but is safer with a rolling walker at this time.  Will assess ambulation with a straight cane next session.     Follow Up Recommendations Home health PT    Equipment Recommendations  Rolling walker with 5" wheels    Recommendations for Other Services   none    Precautions / Restrictions Precautions Precautions: None Restrictions Weight Bearing Restrictions: No      Mobility  Bed Mobility               General bed mobility comments: Pt already in chair upon therapist enterance   Transfers Overall transfer level: Needs assistance Equipment used: Rolling walker (2 wheeled) Transfers: Stand Pivot Transfers;Sit to/from Stand Sit to Stand: Supervision (completed 4 times for activity tolerance ) Stand pivot transfers: Supervision       General transfer comment: transfer with rolling walker  Ambulation/Gait Ambulation/Gait assistance: Supervision Ambulation Distance (Feet): 100 Feet Assistive device: Rolling walker (2 wheeled) Gait Pattern/deviations: Decreased step length - right;Decreased step length - left   Gait velocity interpretation: Below normal speed for age/gender             Pertinent Vitals/Pain Pain Assessment: 0-10 Pain Score: 10-Worst pain ever Pain Descriptors / Indicators: Aching Pain Intervention(s): Limited activity within patient's tolerance    Home  Living Family/patient expects to be discharged to:: Private residence Living Arrangements: Children Available Help at Discharge: Family Type of Home: House       Home Layout: Two level;Able to live on main level with bedroom/bathroom Home Equipment: None      Prior Function Level of Independence: Independent                  Extremity/Trunk Assessment               Lower Extremity Assessment: Generalized weakness         Communication   Communication: No difficulties  Cognition Arousal/Alertness: Awake/alert Behavior During Therapy: WFL for tasks assessed/performed Overall Cognitive Status: Within Functional Limits for tasks assessed                               Assessment/Plan    PT Assessment Patient needs continued PT services  PT Diagnosis Difficulty walking;Generalized weakness;Acute pain   PT Problem List Decreased strength;Decreased activity tolerance;Decreased balance  PT Treatment Interventions Gait training;Stair training;Functional mobility training;Therapeutic activities;Therapeutic exercise   PT Goals (Current goals can be found in the Care Plan section)      Frequency Min 3X/week           End of Session Equipment Utilized During Treatment: Gait belt Activity Tolerance: Patient tolerated treatment well Patient left: in bed Nurse Communication: Mobility status         Time: 1413-1440 PT Time Calculation (min) (ACUTE ONLY): 27 min   Charges:   PT Evaluation $PT Eval  Low Complexity: 1 Procedure     PT G CodesRayetta Humphrey, PT CLT [phone] 03/01/2016, 2:44 PM

## 2016-03-01 NOTE — Progress Notes (Signed)
ANTIBIOTIC CONSULT NOTE - INITIAL  Pharmacy Consult for Vancomycin Indication: pneumonia  Allergies  Allergen Reactions  . Neomycin-Bacitracin Zn-Polymyx     REACTION: "Severe skin rash"  . Pravastatin Sodium     REACTION: "Funny feeling and leg pain"  . Rosuvastatin Rash    Patient Measurements: Height: 5\' 7"  (170.2 cm) Weight: 153 lb 14.1 oz (69.8 kg) IBW/kg (Calculated) : 61.6 Adjusted Body Weight:   Vital Signs: Temp: 102.4 F (39.1 C) (04/03 1542) Temp Source: Oral (04/03 1542) BP: 170/79 mmHg (04/03 1542) Pulse Rate: 99 (04/03 1542) Intake/Output from previous day: 04/02 0701 - 04/03 0700 In: 1370 [P.O.:720; I.V.:600; IV Piggyback:50] Out: 750 [Urine:750] Intake/Output from this shift: Total I/O In: 240 [P.O.:240] Out: 500 [Urine:500]  Labs:  Recent Labs  02/28/16 0514 02/29/16 0638 03/01/16 0518  WBC 18.0* 15.9* 12.9*  HGB 11.4* 11.3* 11.2*  PLT 160 177 200  CREATININE 0.83 0.79  --    Estimated Creatinine Clearance: 53.6 mL/min (by C-G formula based on Cr of 0.79). No results for input(s): VANCOTROUGH, VANCOPEAK, VANCORANDOM, GENTTROUGH, GENTPEAK, GENTRANDOM, TOBRATROUGH, TOBRAPEAK, TOBRARND, AMIKACINPEAK, AMIKACINTROU, AMIKACIN in the last 72 hours.   Microbiology: Recent Results (from the past 720 hour(s))  Blood culture (routine x 2)     Status: None (Preliminary result)   Collection Time: 02/27/16 11:51 AM  Result Value Ref Range Status   Specimen Description BLOOD RIGHT ARM  Final   Special Requests BOTTLES DRAWN AEROBIC AND ANAEROBIC 10CC EACH  Final   Culture NO GROWTH 3 DAYS  Final   Report Status PENDING  Incomplete  Blood culture (routine x 2)     Status: None (Preliminary result)   Collection Time: 02/27/16 12:31 PM  Result Value Ref Range Status   Specimen Description BLOOD RIGHT ARM  Final   Special Requests BOTTLES DRAWN AEROBIC AND ANAEROBIC 8CC EACH  Final   Culture NO GROWTH 3 DAYS  Final   Report Status PENDING  Incomplete   Culture, sputum-assessment     Status: None   Collection Time: 02/28/16  1:38 PM  Result Value Ref Range Status   Specimen Description TRACHEAL SITE  Final   Special Requests NONE  Final   Sputum evaluation THIS SPECIMEN IS ACCEPTABLE FOR SPUTUM CULTURE  Final   Report Status 02/28/2016 FINAL  Final  Culture, respiratory (NON-Expectorated)     Status: None (Preliminary result)   Collection Time: 02/28/16  1:38 PM  Result Value Ref Range Status   Specimen Description SPUTUM  Final   Special Requests NONE  Final   Gram Stain   Final    ABUNDANT WBC PRESENT, PREDOMINANTLY PMN RARE SQUAMOUS EPITHELIAL CELLS PRESENT MODERATE GRAM POSITIVE COCCI IN PAIRS IN CLUSTERS Performed at [facility]    Culture   Final    MODERATE STAPHYLOCOCCUS AUREUS Note: RIFAMPIN AND GENTAMICIN SHOULD NOT BE USED AS SINGLE DRUGS FOR TREATMENT OF STAPH INFECTIONS. Performed at [facility]    Report Status PENDING  Incomplete    Medical History: Past Medical History  Diagnosis Date  . Chest pain, unspecified   . Restless legs syndrome (RLS)   . Fibroids     uterus  . Arthritis   . Constipation   . HTN (hypertension)   . DM (diabetes mellitus) ([place])   . Hyperlipidemia   . Esophageal stricture 09/2011    schatzki's ring  . Vitamin D deficiency   . Helicobacter pylori gastritis 2012    Treated    Medications:  Scheduled:  .  acyclovir  800 mg Oral BID  . albuterol  2.5 mg Nebulization 3 times per day  . aspirin  81 mg Oral Daily  . azithromycin  500 mg Oral Daily  . benzonatate  100 mg Oral TID  . brimonidine  1 drop Both Eyes BID  . cefTRIAXone (ROCEPHIN)  IV  1 g Intravenous Q24H  . cloNIDine  0.1 mg Oral BID  . enalapril  20 mg Oral Daily  . enoxaparin (LOVENOX) injection  40 mg Subcutaneous Q24H  . guaiFENesin  600 mg Oral BID  . insulin aspart  0-9 Units Subcutaneous TID WC  . insulin glargine  10 Units Subcutaneous BID  . latanoprost  1 drop Both Eyes QHS  .  multivitamin with minerals  1 tablet Oral Daily  . prednisoLONE acetate  1 drop Both Eyes BID  . timolol  1 drop Both Eyes BID  . vancomycin  1,250 mg Intravenous Once  . [START ON 03/02/2016] vancomycin  750 mg Intravenous Q12H   Assessment: 82 yo female admitted with CAP, sputum culture staphylococcus aureus.  Vancomycin per pharmacy consult  Goal of Therapy:  Vancomycin trough level 15-20 mcg/ml  Plan:  Vancomycin 1250 mg IV loading dose, then 750 mg IV every 12 hours Vancomycin trough at steady state Labs per protocol  Abner Greenspan, Aolani Piggott Bennett 03/01/2016,3:50 PM

## 2016-03-01 NOTE — Progress Notes (Signed)
Notified Dr. Caryn Section of temp 102.4.  New orders were given. Marland Kitchen

## 2016-03-01 NOTE — Progress Notes (Signed)
[facility] PROGRESS NOTE  Candice Brewer [id number] DOB: May 30, 1932 DOA: 02/27/2016 PCP: Robert Bellow, MD   Code Status: DO NOT RESUSCITATE Family Communication: Discussed with Son Disposition Plan: Discharged to home when clinically appropriate   Consultants:  None  Procedures:  None  Antibiotics:  Vancomycin 03/01/16>>  Azithromycin 02/27/16>>  Rocephin 02/27/16>>   HPI/Subjective: Patient complains of arthritic pain in her neck, on the right side. Her breathing is about the same. Her cough has subsided some. She does feel feverish at times.  Objective: Filed Vitals:   02/29/16 2215 03/01/16 0538  BP: 126/57 166/79  Pulse: 82 94  Temp: 98.5 F (36.9 C) 100.4 F (38 C)  Resp: 20 22  Oxygen saturation 97% on room air. Temperature maximum is 102.1. Current temperature 100.4.  Intake/Output Summary (Last 24 hours) at 03/01/16 1529 Last data filed at 03/01/16 1222  Gross per 24 hour  Intake   1080 ml  Output   1250 ml  Net   -170 ml   Filed Weights   02/27/16 0951 02/28/16 1620  Weight: 67.132 kg (148 lb) 69.8 kg (153 lb 14.1 oz)    Exam:   General:  Pleasant alert 82 year old African-American woman in no acute distress.  Cardiovascular: S1, S2, no murmurs rubs or gallops.  Respiratory: Breathing mildly labored; Right-sided crackles; Less splinting with deep inspiration.   Abdomen: Positive bowel sounds, soft, nontender, nondistended.  Musculoskeletal/extremities: No acute hot red joints. Mild tenderness over the right neck and trapezius; good range of motion.  Neurologic: She is alert and oriented 2. Cranial nerves II through XII are grossly intact.   Data Reviewed: Basic Metabolic Panel:  Recent Labs Lab 02/27/16 1150 02/28/16 0514 02/29/16 0638  NA 134* 134* 134*  K 3.6 3.8 3.8  CL 101 104 104  CO2 23 23 22   GLUCOSE 185* 222* 154*  BUN 14 11 11   CREATININE 0.90 0.83 0.79  CALCIUM 8.9 8.5* 8.6*   Liver Function  Tests:  Recent Labs Lab 02/27/16 1150  AST 21  ALT 15  ALKPHOS 71  BILITOT 1.0  PROT 7.4  ALBUMIN 3.9   No results for input(s): LIPASE, AMYLASE in the last 168 hours. No results for input(s): AMMONIA in the last 168 hours. CBC:  Recent Labs Lab 02/27/16 1150 02/28/16 0514 02/29/16 0638 03/01/16 0518  WBC 15.2* 18.0* 15.9* 12.9*  NEUTROABS 12.5*  --   --   --   HGB 12.0 11.4* 11.3* 11.2*  HCT 34.4* 33.6* 33.3* 32.9*  MCV 97.7 98.0 98.2 98.5  PLT 169 160 177 200   Cardiac Enzymes:  Recent Labs Lab 02/27/16 1156  TROPONINI <0.03   BNP (last 3 results) No results for input(s): BNP in the last 8760 hours.  ProBNP (last 3 results) No results for input(s): PROBNP in the last 8760 hours.  CBG:  Recent Labs Lab 02/29/16 0813 02/29/16 1134 02/29/16 1637 02/29/16 2214 03/01/16 0747  GLUCAP 146* 203* 189* 164* 128*    Recent Results (from the past 240 hour(s))  Blood culture (routine x 2)     Status: None (Preliminary result)   Collection Time: 02/27/16 11:51 AM  Result Value Ref Range Status   Specimen Description BLOOD RIGHT ARM  Final   Special Requests BOTTLES DRAWN AEROBIC AND ANAEROBIC 10CC EACH  Final   Culture NO GROWTH 3 DAYS  Final   Report Status PENDING  Incomplete  Blood culture (routine x 2)     Status: None (Preliminary result)  Collection Time: 02/27/16 12:31 PM  Result Value Ref Range Status   Specimen Description BLOOD RIGHT ARM  Final   Special Requests BOTTLES DRAWN AEROBIC AND ANAEROBIC 8CC EACH  Final   Culture NO GROWTH 3 DAYS  Final   Report Status PENDING  Incomplete  Culture, sputum-assessment     Status: None   Collection Time: 02/28/16  1:38 PM  Result Value Ref Range Status   Specimen Description TRACHEAL SITE  Final   Special Requests NONE  Final   Sputum evaluation THIS SPECIMEN IS ACCEPTABLE FOR SPUTUM CULTURE  Final   Report Status 02/28/2016 FINAL  Final  Culture, respiratory (NON-Expectorated)     Status: None  (Preliminary result)   Collection Time: 02/28/16  1:38 PM  Result Value Ref Range Status   Specimen Description SPUTUM  Final   Special Requests NONE  Final   Gram Stain   Final    ABUNDANT WBC PRESENT, PREDOMINANTLY PMN RARE SQUAMOUS EPITHELIAL CELLS PRESENT MODERATE GRAM POSITIVE COCCI IN PAIRS IN CLUSTERS Performed at [facility]    Culture   Final    MODERATE STAPHYLOCOCCUS AUREUS Note: RIFAMPIN AND GENTAMICIN SHOULD NOT BE USED AS SINGLE DRUGS FOR TREATMENT OF STAPH INFECTIONS. Performed at [facility]    Report Status PENDING  Incomplete     Studies: No results found.  Scheduled Meds: . acyclovir  800 mg Oral BID  . albuterol  2.5 mg Nebulization 3 times per day  . aspirin  81 mg Oral Daily  . azithromycin  500 mg Oral Daily  . benzonatate  100 mg Oral TID  . brimonidine  1 drop Both Eyes BID  . cefTRIAXone (ROCEPHIN)  IV  1 g Intravenous Q24H  . cloNIDine  0.1 mg Oral BID  . enalapril  20 mg Oral Daily  . enoxaparin (LOVENOX) injection  40 mg Subcutaneous Q24H  . guaiFENesin  600 mg Oral BID  . insulin aspart  0-9 Units Subcutaneous TID WC  . insulin glargine  10 Units Subcutaneous BID  . latanoprost  1 drop Both Eyes QHS  . multivitamin with minerals  1 tablet Oral Daily  . prednisoLONE acetate  1 drop Both Eyes BID  . timolol  1 drop Both Eyes BID   Continuous Infusions: . 0.9 % NaCl with KCl 20 mEq / L 50 mL/hr at 03/01/16 0419   Assessment and plan: Principal Problem:   CAP (community acquired pneumonia) Active Problems:   Type 2 diabetes mellitus without complication ([place])   Essential hypertension   Pneumonia   Pleuritic chest pain   1. Community-acquired pneumonia; possible Staphylococcal Aureus On admission, the patient was febrile with temperature 101.5. Her chest x-ray revealed right middle lobe pneumonia. She was started on Rocephin and azithromycin. Oxygen was applied to keep her oxygen saturations greater than 90%.  Guaifenesin was ordered twice a day.  Tessalon Perles was added for cough. As needed nebulizers ordered for possible bronchospasms. -Patient feels better with "breathing treatments", so albuterol scheduled every 8 hours. -Strep pneumo antigen negative. Legionella antigen are pending.  Blood cultures negative to date. Sputum culture growing out Staphylococcus aureus. Will start and add vancomycin to current antibiotics. -Follow-up chest x-ray ordered and pending.  Type 2 diabetes mellitus.  Patient is on metformin chronically. Metformin was held on admission. Sliding scale NovoLog and Lantus were started. Hemoglobin A1c pending.  Hypertension. Patient is treated chronically with enalapril and clonidine. They were continued on admission. Her blood pressure is trending up.  Mild hyponatremia. We'll continue gentle normal saline.  Mild normocytic anemia.  Likely dilutional from IV fluids. We'll continue to monitor.    Time spent: 30 minutes.    [facility] Pager [phone]. If 7PM-7AM, please contact night-coverage at [email or web address], password [facility] 03/01/2016, 3:29 PM  LOS: 2 days

## 2016-03-02 LAB — BASIC METABOLIC PANEL
ANION GAP: 11 (ref 5–15)
BUN: 11 mg/dL (ref 6–20)
CALCIUM: 8.4 mg/dL — AB (ref 8.9–10.3)
CHLORIDE: 103 mmol/L (ref 101–111)
CO2: 21 mmol/L — AB (ref 22–32)
CREATININE: 0.83 mg/dL (ref 0.44–1.00)
GFR calc Af Amer: >60 mL/min (ref >60)
GLUCOSE: 179 mg/dL — AB (ref 65–99)
POTASSIUM: 3.7 mmol/L (ref 3.5–5.1)
Sodium: 135 mmol/L (ref 135–145)

## 2016-03-02 LAB — CBC
HEMATOCRIT: 30.4 % — AB (ref 36.0–46.0)
HEMOGLOBIN: 10.5 g/dL — AB (ref 12.0–15.0)
MCH: 33.2 pg (ref 26.0–34.0)
MCHC: 34.5 g/dL (ref 30.0–36.0)
MCV: 96.2 fL (ref 78.0–100.0)
Platelets: 237 10*3/uL (ref 150–400)
RBC: 3.16 MIL/uL — ABNORMAL LOW (ref 3.87–5.11)
RDW: 13.1 % (ref 11.5–15.5)
WBC: 12.1 10*3/uL — AB (ref 4.0–10.5)

## 2016-03-02 LAB — HEMOGLOBIN A1C
HEMOGLOBIN A1C: 7.5 % — AB (ref 4.8–5.6)
Mean Plasma Glucose: 169 mg/dL

## 2016-03-02 LAB — GLUCOSE, CAPILLARY
GLUCOSE-CAPILLARY: 167 mg/dL — AB (ref 65–99)
GLUCOSE-CAPILLARY: 126 mg/dL — AB (ref 65–99)
GLUCOSE-CAPILLARY: 158 mg/dL — AB (ref 65–99)
GLUCOSE-CAPILLARY: 170 mg/dL — AB (ref 65–99)
GLUCOSE-CAPILLARY: 178 mg/dL — AB (ref 65–99)
Glucose-Capillary: 175 mg/dL — ABNORMAL HIGH (ref 65–99)

## 2016-03-02 LAB — CULTURE, RESPIRATORY

## 2016-03-02 LAB — CULTURE, RESPIRATORY W GRAM STAIN

## 2016-03-02 MED ORDER — HYDROCHLOROTHIAZIDE 12.5 MG PO CAPS: 12.5000 mg | ORAL_CAPSULE | Freq: Every day | ORAL | Status: DC

## 2016-03-02 MED ORDER — HYDRALAZINE HCL 20 MG/ML IJ SOLN
5.0000 mg | INTRAMUSCULAR | Status: DC | PRN
  Administered 2016-03-03: 5 mg via INTRAVENOUS
  Filled 2016-03-02: qty 1

## 2016-03-02 MED ORDER — CLONIDINE HCL 0.1 MG PO TABS
0.1000 mg | ORAL_TABLET | Freq: Three times a day (TID) | ORAL | Status: DC
  Administered 2016-03-02 – 2016-03-04 (×6): 0.1 mg via ORAL
  Filled 2016-03-02 (×6): qty 1

## 2016-03-02 MED ORDER — PIPERACILLIN-TAZOBACTAM 3.375 G IVPB
3.3750 g | Freq: Three times a day (TID) | INTRAVENOUS | Status: DC
  Administered 2016-03-02 – 2016-03-03 (×4): 3.375 g via INTRAVENOUS
  Filled 2016-03-02 (×4): qty 50

## 2016-03-02 NOTE — Progress Notes (Addendum)
[facility] PROGRESS NOTE  Candice Brewer [id number] DOB: [birth year] DOA: 02/27/2016 PCP: Robert Bellow, MD   Code Status: DO NOT RESUSCITATE Family Communication: Discussed with Son Disposition Plan: Discharged to home when clinically appropriate, likely in 2-3 days.   Consultants:  None  Procedures:  None  Antibiotics:  Zosyn 03/02/16>>  Vancomycin 03/01/16>>  Rocephin 02/27/16>> 03/02/16  Azithromycin 02/27/16>> 03/02/16   HPI/Subjective: Patient acknowledges some shortness of breath at rest and with activity. She still has some pleuritic chest pain on the right, but not as bad.  Objective: Filed Vitals:   03/02/16 0500 03/02/16 0600  BP: 190/87 168/78  Pulse: 100   Temp: 99.5 F (37.5 C)   Resp: 24   Temperature maximum is 102.5. Current temperature 99.5  Intake/Output Summary (Last 24 hours) at 03/02/16 1025 Last data filed at 03/02/16 0800  Gross per 24 hour  Intake 2591.67 ml  Output   2450 ml  Net 141.67 ml   Filed Weights   02/27/16 0951 02/28/16 1620  Weight: 67.132 kg (148 lb) 69.8 kg (153 lb 14.1 oz)    Exam:   General:  Pleasant alert 82 year old African-American woman, sitting up in the chair, no acute distress.  Cardiovascular: S1, S2, no murmurs rubs or gallops.  Respiratory: Breathing mildly labored; Right-sided crackles; mild splinting.  Abdomen: Positive bowel sounds, soft, nontender, nondistended.  Musculoskeletal/extremities: No acute hot red joints. Mild tenderness over the right neck and trapezius; good range of motion.  Neurologic: She is alert and oriented 2. Cranial nerves II through XII are grossly intact.   Data Reviewed: Basic Metabolic Panel:  Recent Labs Lab 02/27/16 1150 02/28/16 0514 02/29/16 0638 03/02/16 0552  NA 134* 134* 134* 135  K 3.6 3.8 3.8 3.7  CL 101 104 104 103  CO2 23 23 22  21*  GLUCOSE 185* 222* 154* 179*  BUN 14 11 11 11   CREATININE 0.90 0.83 0.79 0.83  CALCIUM 8.9 8.5* 8.6* 8.4*    Liver Function Tests:  Recent Labs Lab 02/27/16 1150  AST 21  ALT 15  ALKPHOS 71  BILITOT 1.0  PROT 7.4  ALBUMIN 3.9   No results for input(s): LIPASE, AMYLASE in the last 168 hours. No results for input(s): AMMONIA in the last 168 hours. CBC:  Recent Labs Lab 02/27/16 1150 02/28/16 0514 02/29/16 0638 03/01/16 0518 03/02/16 0552  WBC 15.2* 18.0* 15.9* 12.9* 12.1*  NEUTROABS 12.5*  --   --   --   --   HGB 12.0 11.4* 11.3* 11.2* 10.5*  HCT 34.4* 33.6* 33.3* 32.9* 30.4*  MCV 97.7 98.0 98.2 98.5 96.2  PLT 169 160 177 200 237   Cardiac Enzymes:  Recent Labs Lab 02/27/16 1156  TROPONINI <0.03   BNP (last 3 results) No results for input(s): BNP in the last 8760 hours.  ProBNP (last 3 results) No results for input(s): PROBNP in the last 8760 hours.  CBG:  Recent Labs Lab 03/01/16 0747 03/01/16 1157 03/01/16 1651 03/01/16 2125 03/02/16 0727  GLUCAP 128* 167* 126* 188* 158*    Recent Results (from the past 240 hour(s))  Blood culture (routine x 2)     Status: None (Preliminary result)   Collection Time: 02/27/16 11:51 AM  Result Value Ref Range Status   Specimen Description BLOOD RIGHT ARM  Final   Special Requests BOTTLES DRAWN AEROBIC AND ANAEROBIC 10CC EACH  Final   Culture NO GROWTH 3 DAYS  Final   Report Status PENDING  Incomplete  Blood culture (routine  x 2)     Status: None (Preliminary result)   Collection Time: 02/27/16 12:31 PM  Result Value Ref Range Status   Specimen Description BLOOD RIGHT ARM  Final   Special Requests BOTTLES DRAWN AEROBIC AND ANAEROBIC 8CC EACH  Final   Culture NO GROWTH 3 DAYS  Final   Report Status PENDING  Incomplete  Culture, sputum-assessment     Status: None   Collection Time: 02/28/16  1:38 PM  Result Value Ref Range Status   Specimen Description TRACHEAL SITE  Final   Special Requests NONE  Final   Sputum evaluation THIS SPECIMEN IS ACCEPTABLE FOR SPUTUM CULTURE  Final   Report Status 02/28/2016 FINAL  Final   Culture, respiratory (NON-Expectorated)     Status: None   Collection Time: 02/28/16  1:38 PM  Result Value Ref Range Status   Specimen Description SPUTUM  Final   Special Requests NONE  Final   Gram Stain   Final    ABUNDANT WBC PRESENT, PREDOMINANTLY PMN RARE SQUAMOUS EPITHELIAL CELLS PRESENT MODERATE GRAM POSITIVE COCCI IN PAIRS IN CLUSTERS Performed at [facility]    Culture   Final    MODERATE METHICILLIN RESISTANT STAPHYLOCOCCUS AUREUS Note: RIFAMPIN AND GENTAMICIN SHOULD NOT BE USED AS SINGLE DRUGS FOR TREATMENT OF STAPH INFECTIONS. This organism DOES NOT demonstrate inducible Clindamycin resistance in vitro. CRITICAL RESULT CALLED TO, READ BACK BY AND VERIFIED WITH: VALDEZ 04.04.17  0850 BY PERCN Performed at [facility]    Report Status 03/02/2016 FINAL  Final   Organism ID, Bacteria METHICILLIN RESISTANT STAPHYLOCOCCUS AUREUS  Final      Susceptibility   Methicillin resistant staphylococcus aureus - MIC*    CLINDAMYCIN <=0.25 SENSITIVE Sensitive     ERYTHROMYCIN >=8 RESISTANT Resistant     GENTAMICIN <=0.5 SENSITIVE Sensitive     LEVOFLOXACIN 4 INTERMEDIATE Intermediate     OXACILLIN >=4 RESISTANT Resistant     RIFAMPIN <=0.5 SENSITIVE Sensitive     TRIMETH/SULFA <=10 SENSITIVE Sensitive     VANCOMYCIN 1 SENSITIVE Sensitive     TETRACYCLINE <=1 SENSITIVE Sensitive     * MODERATE METHICILLIN RESISTANT STAPHYLOCOCCUS AUREUS  MRSA PCR Screening     Status: None   Collection Time: 03/01/16  5:05 PM  Result Value Ref Range Status   MRSA by PCR NEGATIVE NEGATIVE Final    Comment:        The GeneXpert MRSA Assay (FDA approved for NASAL specimens only), is one component of a comprehensive MRSA colonization surveillance program. It is not intended to diagnose MRSA infection nor to guide or monitor treatment for MRSA infections.   Culture, blood (Routine X 2) w Reflex to ID Panel     Status: None (Preliminary result)   Collection Time: 03/01/16   7:53 PM  Result Value Ref Range Status   Specimen Description BLOOD RIGHT ARM  Final   Special Requests BOTTLES DRAWN AEROBIC AND ANAEROBIC 6CC  Final   Culture PENDING  Incomplete   Report Status PENDING  Incomplete  Culture, blood (Routine X 2) w Reflex to ID Panel     Status: None (Preliminary result)   Collection Time: 03/01/16  7:57 PM  Result Value Ref Range Status   Specimen Description BLOOD LEFT ARM  Final   Special Requests BOTTLES DRAWN AEROBIC AND ANAEROBIC [facility]  Final   Culture PENDING  Incomplete   Report Status PENDING  Incomplete     Studies: Dg Chest 2 View  03/01/2016  CLINICAL DATA:  Cough and chest congestion, several days duration. EXAM: CHEST  2 VIEW COMPARISON:  02/27/2016 FINDINGS: There is worsened consolidation in the right middle lobe and right lower lobe, with development of a small right pleural effusion since 02/27/2016. The left lung is clear. Hilar and mediastinal contours are unremarkable. IMPRESSION: Worsening right middle lobe and right lower lobe consolidation. Developing small effusion. Probable pneumonia. Electronically Signed   By: Andreas Newport M.D.   On: 03/01/2016 18:36    Scheduled Meds: . acyclovir  800 mg Oral BID  . albuterol  2.5 mg Nebulization 3 times per day  . aspirin  81 mg Oral Daily  . benzonatate  100 mg Oral TID  . brimonidine  1 drop Both Eyes BID  . cefTRIAXone (ROCEPHIN)  IV  1 g Intravenous Q24H  . cloNIDine  0.1 mg Oral BID  . enalapril  20 mg Oral Daily  . enoxaparin (LOVENOX) injection  40 mg Subcutaneous Q24H  . guaiFENesin  600 mg Oral BID  . hydrochlorothiazide  12.5 mg Oral Daily  . insulin aspart  0-9 Units Subcutaneous TID WC  . insulin glargine  10 Units Subcutaneous BID  . latanoprost  1 drop Both Eyes QHS  . multivitamin with minerals  1 tablet Oral Daily  . prednisoLONE acetate  1 drop Both Eyes BID  . senna-docusate  1 tablet Oral BID  . timolol  1 drop Both Eyes BID  . vancomycin  750 mg Intravenous  Q12H   Continuous Infusions: . 0.9 % NaCl with KCl 20 mEq / L 50 mL/hr at 03/02/16 0500   Assessment and plan: Principal Problem:   CAP (community acquired pneumonia) Active Problems:   Type 2 diabetes mellitus without complication (HCC)   Essential hypertension   Pneumonia   Pleuritic chest pain   Staphylococcus aureus infection   1. Community-acquired pneumonia presumed to be secondary to Staphylococcal Aureus On admission, the patient was febrile with temperature 101.5. Her chest x-ray revealed right middle lobe pneumonia. She was started on Rocephin and azithromycin. Oxygen was applied to keep her oxygen saturations greater than 90%. Guaifenesin was ordered twice a day.  Tessalon Perles was added for cough. As needed nebulizers ordered for possible bronchospasms. -Patient feels better with "breathing treatments", so albuterol scheduled every 8 hours. -Strep pneumo antigen negative. Legionella antigen are pending. -Blood cultures on admission negative to date. Another set of blood cultures ordered on 03/01/16 due to fever spikes. -Sputum culture is growing out MRSA. Vancomycin started on 03/02/16. -Follow-up chest x-ray revealed worsening right-sided pneumonia. For this reason, we'll discontinue Rocephin and azithromycin and add Zosyn to vancomycin. -Pneumonia is right sided and the patient wears partial dentures, so I wonder if she is aspirating. We'll order speech therapy consult.  Type 2 diabetes mellitus.  Patient is on metformin chronically. Metformin was held on admission. Sliding scale NovoLog and Lantus were started. Hemoglobin A1c pending. Her CBGs have been reasonable.  Hypertension. Patient is treated chronically with enalapril and clonidine. They were continued on admission. Her blood pressure is trending up. -We'll add hydralazine IV when necessary. We'll increase clonidine from twice a day to 3 times a day.   Mild hyponatremia. We'll continue gentle normal  saline.  Mild normocytic anemia.  Likely dilutional from IV fluids. We'll continue to monitor.    Time spent: 30 minutes.    [facility] Pager [phone]. If 7PM-7AM, please contact night-coverage at [email or web address], password [facility] 03/02/2016, 10:25 AM  LOS: 3 days

## 2016-03-02 NOTE — Evaluation (Addendum)
Clinical/Bedside Swallow Evaluation Patient Details  Name: Candice Brewer MRN: [id number] Date of Birth: [birth year]  Today's Date: 03/02/2016 Time: SLP Start Time (ACUTE ONLY): 1330 SLP Stop Time (ACUTE ONLY): 1400 SLP Time Calculation (min) (ACUTE ONLY): 30 min  Past Medical History:  Past Medical History  Diagnosis Date  . Chest pain, unspecified   . Restless legs syndrome (RLS)   . Fibroids     uterus  . Arthritis   . Constipation   . HTN (hypertension)   . DM (diabetes mellitus) ([place])   . Hyperlipidemia   . Esophageal stricture 09/2011    schatzki's ring  . Vitamin D deficiency   . Helicobacter pylori gastritis 2012    Treated   Past Surgical History:  Past Surgical History  Procedure Laterality Date  . Appendectomy    . Tubal ligation    . Cardiovascular stress test  2009  . Cataract extraction  2010    right eye  . Esophagogastroduodenoscopy  06/2008    Dr. Lorrin Mais ring s/p disruption, erosive reflux esophagitis, hh.   . Colonoscopy  08/2006    Dr. Vivi Ferns  . Cataract extraction  2012    left eye  . Colonoscopy  10/13/2011    Normal. Next TCS 09/2016.   Procedure: COLONOSCOPY;  Surgeon: Daneil Dolin, MD;  Location: [facility];  Service: Endoscopy;  Laterality: N/A;  8:15  . Esophagogastroduodenoscopy  09/2011    Schatki's ring s/p dilation, multiple 1-42mm antral and bulbar erosions, bx positive for H.Pylori s/ treatment  . Colonoscopy N/A 03/10/2015    RMR: External and anal canal hemorrhoids likely source of hematochezia. Redundant colon. Single sigmoid polyp removed ad described above.    HPI:  Candice Brewer is a 82 y.o. female with a past medical history of hypertension and diabetes who was in her usual state of health when she started developing pain in the right side of her chest as well as generalized body aches. She had a cough with yellowish expectoration. Had some difficulty breathing. Denies any blood in the sputum. She had a fever  up to 101F. Denies any chills. No nausea or vomiting. No leg swelling. She visited her aunt about 2 weeks ago in the nursing facility and her aunt had pneumonia. She goes to the [facility] on a regular basis. She had 2 loose stools yesterday but none today. Denies any history of smoking currently. No history of recent hospitalization within the last 3 months. She states that she is usually in good health. Pain was 8 out of 10 intensity in the right side of the chest. It was worse with deep breathing and cough. Described as a sharp pain. Pain has improved significantly since she has been in the emergency department. She was found to have leukocytosis. Chest x-ray showed right-sided pneumonia. Concern for aspiration so MD ordered SLP to evaluate swallow.    Assessment / Plan / Recommendation Clinical Impression  Oral motor examination is unremarkable (WNL). Pt shows no signs or symptoms of aspiration at bedside with regular textures and thin liquids (observed with lunch meal). Vocal quality remains clear and pt denies feelings of stasis. Pt is visibly short of breath while talking and eating. She tells me that she came to the hospital on Friday and really does "not feel any better", which is concerning. Pt has limited risk factors for aspiration, but does report history of esophageal phase dysphagia necessitating dilation several years ago. She is followed by Dr. Gala Romney and denies globus  sensation or early satiety. She reports no difficulty swallowing her medications. She is careful to cut up her solid foods into small pieces and masticate thoroughly. Recommend continue diet as ordered (regular/thin) and SLP will follow up x1 for diet tolerance. Above to RN. Pt in agreement with plan of care. We can complete MBSS if MD suspects occult process.  Pt had EGD 10/13/2011 which showed: She has a history of dysphagia and was found to have a Schatzki ring which was dilated. She was also found to have incidental H. Pylori  gastritis.   Aspiration Risk  No limitations    Diet Recommendation Regular;Thin liquid   Liquid Administration via: Straw;Cup Medication Administration: Whole meds with liquid Supervision: Patient able to self feed Postural Changes: Seated upright at 90 degrees;Remain upright for at least 30 minutes after po intake    Other  Recommendations Oral Care Recommendations: Oral care BID;Patient independent with oral care Other Recommendations: Clarify dietary restrictions   Follow up Recommendations  None    Frequency and Duration min 1 x/week  1 week       Prognosis Prognosis for Safe Diet Advancement: Good      Swallow Study   General Date of Onset: 02/27/16 HPI: Candice Brewer is a 82 y.o. female with a past medical history of hypertension and diabetes who was in her usual state of health when she started developing pain in the right side of her chest as well as generalized body aches. She had a cough with yellowish expectoration. Had some difficulty breathing. Denies any blood in the sputum. She had a fever up to 101F. Denies any chills. No nausea or vomiting. No leg swelling. She visited her aunt about 2 weeks ago in the nursing facility and her aunt had pneumonia. She goes to the [facility] on a regular basis. She had 2 loose stools yesterday but none today. Denies any history of smoking currently. No history of recent hospitalization within the last 3 months. She states that she is usually in good health. Pain was 8 out of 10 intensity in the right side of the chest. It was worse with deep breathing and cough. Described as a sharp pain. Pain has improved significantly since she has been in the emergency department. She was found to have leukocytosis. Chest x-ray showed right-sided pneumonia. Concern for aspiration so MD ordered SLP to evaluate swallow.  Type of Study: Bedside Swallow Evaluation Previous Swallow Assessment: None on record Diet Prior to this Study: Regular;Thin  liquids Temperature Spikes Noted: No Respiratory Status: Room air (pt visibly short of breath during my visit) History of Recent Intubation: No Behavior/Cognition: Alert;Cooperative;Pleasant mood Oral Cavity Assessment: Within Functional Limits Oral Care Completed by SLP: No Oral Cavity - Dentition:  (partial) Vision: Functional for self-feeding Self-Feeding Abilities: Able to feed self Patient Positioning: Upright in chair Baseline Vocal Quality: Normal (mildly hoarse) Volitional Cough: Strong (painful per patient) Volitional Swallow: Able to elicit    Oral/Motor/Sensory Function Overall Oral Motor/Sensory Function: Within functional limits   Ice Chips Ice chips: Not tested   Thin Liquid Thin Liquid: Within functional limits Presentation: Cup;Straw;Self Fed    Nectar Thick Nectar Thick Liquid: Not tested   Honey Thick Honey Thick Liquid: Not tested   Puree Puree: Within functional limits Presentation: Self Fed;Spoon   Solid      Solid: Within functional limits Presentation: Self Fed;Spoon Other Comments:  (Pt takes small bites and cuts food up well due to h/o esopha)  Thank you,  Genene Churn, [place]  [place] 03/02/2016,3:25 PM

## 2016-03-02 NOTE — Progress Notes (Signed)
ANTIBIOTIC CONSULT NOTE - INITIAL  Pharmacy Consult for Vancomycin and Zosyn Indication: pneumonia  Allergies  Allergen Reactions  . Neomycin-Bacitracin Zn-Polymyx     REACTION: "Severe skin rash"  . Pravastatin Sodium     REACTION: "Funny feeling and leg pain"  . Rosuvastatin Rash   Patient Measurements: Height: 5\' 7"  (170.2 cm) Weight: 153 lb 14.1 oz (69.8 kg) IBW/kg (Calculated) : 61.6  Vital Signs: Temp: 99.5 F (37.5 C) (04/04 0500) Temp Source: Oral (04/04 0500) BP: 168/78 mmHg (04/04 0600) Pulse Rate: 100 (04/04 0500) Intake/Output from previous day: 04/03 0701 - 04/04 0700 In: 2591.7 [P.O.:720; I.V.:1721.7; IV Piggyback:150] Out: 2450 [Urine:2450] Intake/Output from this shift: Total I/O In: 240 [P.O.:240] Out: -   Labs:  Recent Labs  02/29/16 0638 03/01/16 0518 03/02/16 0552  WBC 15.9* 12.9* 12.1*  HGB 11.3* 11.2* 10.5*  PLT 177 200 237  CREATININE 0.79  --  0.83   Estimated Creatinine Clearance: 51.7 mL/min (by C-G formula based on Cr of 0.83). No results for input(s): VANCOTROUGH, VANCOPEAK, VANCORANDOM, GENTTROUGH, GENTPEAK, GENTRANDOM, TOBRATROUGH, TOBRAPEAK, TOBRARND, AMIKACINPEAK, AMIKACINTROU, AMIKACIN in the last 72 hours.   Microbiology: Recent Results (from the past 720 hour(s))  Blood culture (routine x 2)     Status: None (Preliminary result)   Collection Time: 02/27/16 11:51 AM  Result Value Ref Range Status   Specimen Description BLOOD RIGHT ARM  Final   Special Requests BOTTLES DRAWN AEROBIC AND ANAEROBIC 10CC EACH  Final   Culture NO GROWTH 3 DAYS  Final   Report Status PENDING  Incomplete  Blood culture (routine x 2)     Status: None (Preliminary result)   Collection Time: 02/27/16 12:31 PM  Result Value Ref Range Status   Specimen Description BLOOD RIGHT ARM  Final   Special Requests BOTTLES DRAWN AEROBIC AND ANAEROBIC 8CC EACH  Final   Culture NO GROWTH 3 DAYS  Final   Report Status PENDING  Incomplete  Culture,  sputum-assessment     Status: None   Collection Time: 02/28/16  1:38 PM  Result Value Ref Range Status   Specimen Description TRACHEAL SITE  Final   Special Requests NONE  Final   Sputum evaluation THIS SPECIMEN IS ACCEPTABLE FOR SPUTUM CULTURE  Final   Report Status 02/28/2016 FINAL  Final  Culture, respiratory (NON-Expectorated)     Status: None   Collection Time: 02/28/16  1:38 PM  Result Value Ref Range Status   Specimen Description SPUTUM  Final   Special Requests NONE  Final   Gram Stain   Final    ABUNDANT WBC PRESENT, PREDOMINANTLY PMN RARE SQUAMOUS EPITHELIAL CELLS PRESENT MODERATE GRAM POSITIVE COCCI IN PAIRS IN CLUSTERS Performed at [facility]    Culture   Final    MODERATE METHICILLIN RESISTANT STAPHYLOCOCCUS AUREUS Note: RIFAMPIN AND GENTAMICIN SHOULD NOT BE USED AS SINGLE DRUGS FOR TREATMENT OF STAPH INFECTIONS. This organism DOES NOT demonstrate inducible Clindamycin resistance in vitro. CRITICAL RESULT CALLED TO, READ BACK BY AND VERIFIED WITH: VALDEZ 04.04.17  0850 BY PERCN Performed at [facility]    Report Status 03/02/2016 FINAL  Final   Organism ID, Bacteria METHICILLIN RESISTANT STAPHYLOCOCCUS AUREUS  Final      Susceptibility   Methicillin resistant staphylococcus aureus - MIC*    CLINDAMYCIN <=0.25 SENSITIVE Sensitive     ERYTHROMYCIN >=8 RESISTANT Resistant     GENTAMICIN <=0.5 SENSITIVE Sensitive     LEVOFLOXACIN 4 INTERMEDIATE Intermediate     OXACILLIN >=4 RESISTANT Resistant  RIFAMPIN <=0.5 SENSITIVE Sensitive     TRIMETH/SULFA <=10 SENSITIVE Sensitive     VANCOMYCIN 1 SENSITIVE Sensitive     TETRACYCLINE <=1 SENSITIVE Sensitive     * MODERATE METHICILLIN RESISTANT STAPHYLOCOCCUS AUREUS  MRSA PCR Screening     Status: None   Collection Time: 03/01/16  5:05 PM  Result Value Ref Range Status   MRSA by PCR NEGATIVE NEGATIVE Final    Comment:        The GeneXpert MRSA Assay (FDA approved for NASAL specimens only), is one  component of a comprehensive MRSA colonization surveillance program. It is not intended to diagnose MRSA infection nor to guide or monitor treatment for MRSA infections.   Culture, blood (Routine X 2) w Reflex to ID Panel     Status: None (Preliminary result)   Collection Time: 03/01/16  7:53 PM  Result Value Ref Range Status   Specimen Description BLOOD RIGHT ARM  Final   Special Requests BOTTLES DRAWN AEROBIC AND ANAEROBIC 6CC  Final   Culture PENDING  Incomplete   Report Status PENDING  Incomplete  Culture, blood (Routine X 2) w Reflex to ID Panel     Status: None (Preliminary result)   Collection Time: 03/01/16  7:57 PM  Result Value Ref Range Status   Specimen Description BLOOD LEFT ARM  Final   Special Requests BOTTLES DRAWN AEROBIC AND ANAEROBIC 6CC  Final   Culture PENDING  Incomplete   Report Status PENDING  Incomplete    Medical History: Past Medical History  Diagnosis Date  . Chest pain, unspecified   . Restless legs syndrome (RLS)   . Fibroids     uterus  . Arthritis   . Constipation   . HTN (hypertension)   . DM (diabetes mellitus) ([place])   . Hyperlipidemia   . Esophageal stricture 09/2011    schatzki's ring  . Vitamin D deficiency   . Helicobacter pylori gastritis 2012    Treated    Medications:  Scheduled:  . acyclovir  800 mg Oral BID  . albuterol  2.5 mg Nebulization 3 times per day  . aspirin  81 mg Oral Daily  . benzonatate  100 mg Oral TID  . brimonidine  1 drop Both Eyes BID  . cloNIDine  0.1 mg Oral BID  . enalapril  20 mg Oral Daily  . enoxaparin (LOVENOX) injection  40 mg Subcutaneous Q24H  . guaiFENesin  600 mg Oral BID  . hydrochlorothiazide  12.5 mg Oral Daily  . insulin aspart  0-9 Units Subcutaneous TID WC  . insulin glargine  10 Units Subcutaneous BID  . latanoprost  1 drop Both Eyes QHS  . multivitamin with minerals  1 tablet Oral Daily  . piperacillin-tazobactam (ZOSYN)  IV  3.375 g Intravenous Q8H  . prednisoLONE acetate  1  drop Both Eyes BID  . senna-docusate  1 tablet Oral BID  . timolol  1 drop Both Eyes BID  . vancomycin  750 mg Intravenous Q12H   Assessment: 82 yo female admitted with CAP, sputum culture staphylococcus aureus.  Vancomycin per pharmacy consult started 03/01/16.  Now asked to add Zosyn.  Goal of Therapy:  Vancomycin trough level 15-20 mcg/ml  Plan:  Continue Vancomycin 750 mg IV every 12 hours Vancomycin trough at steady state Zosyn 3.375gm IV q8h, EID Labs per protocol  Hart Robinsons A 03/02/2016,10:36 AM

## 2016-03-02 NOTE — Progress Notes (Signed)
Patient placed on Contact Isolation per Infection control policy. Patient,and family educated on Contact Isolation,educational care notes provided, patient,and family verbalized understanding.

## 2016-03-03 DIAGNOSIS — J189 Pneumonia, unspecified organism: Secondary | ICD-10-CM

## 2016-03-03 LAB — HEMOGLOBIN A1C
Hgb A1c MFr Bld: 7.2 % — ABNORMAL HIGH (ref 4.8–5.6)
MEAN PLASMA GLUCOSE: 160 mg/dL

## 2016-03-03 LAB — CBC
HCT: 33.9 % — ABNORMAL LOW (ref 36.0–46.0)
HEMOGLOBIN: 11.9 g/dL — AB (ref 12.0–15.0)
MCH: 33.8 pg (ref 26.0–34.0)
MCHC: 35.1 g/dL (ref 30.0–36.0)
MCV: 96.3 fL (ref 78.0–100.0)
PLATELETS: 331 10*3/uL (ref 150–400)
RBC: 3.52 MIL/uL — AB (ref 3.87–5.11)
RDW: 13.3 % (ref 11.5–15.5)
WBC: 16.5 10*3/uL — ABNORMAL HIGH (ref 4.0–10.5)

## 2016-03-03 LAB — GLUCOSE, CAPILLARY
GLUCOSE-CAPILLARY: 143 mg/dL — AB (ref 65–99)
GLUCOSE-CAPILLARY: 203 mg/dL — AB (ref 65–99)
Glucose-Capillary: 155 mg/dL — ABNORMAL HIGH (ref 65–99)
Glucose-Capillary: 191 mg/dL — ABNORMAL HIGH (ref 65–99)

## 2016-03-03 LAB — BASIC METABOLIC PANEL
Anion gap: 12 (ref 5–15)
BUN: 9 mg/dL (ref 6–20)
CHLORIDE: 99 mmol/L — AB (ref 101–111)
CO2: 24 mmol/L (ref 22–32)
CREATININE: 0.9 mg/dL (ref 0.44–1.00)
Calcium: 9 mg/dL (ref 8.9–10.3)
GFR, EST NON AFRICAN AMERICAN: 58 mL/min — AB (ref >60)
Glucose, Bld: 199 mg/dL — ABNORMAL HIGH (ref 65–99)
Potassium: 3.6 mmol/L (ref 3.5–5.1)
SODIUM: 135 mmol/L (ref 135–145)

## 2016-03-03 LAB — CULTURE, BLOOD (ROUTINE X 2)
CULTURE: NO GROWTH
Culture: NO GROWTH

## 2016-03-03 MED ORDER — MUPIROCIN 2 % EX OINT
TOPICAL_OINTMENT | CUTANEOUS | Status: DC | PRN
  Administered 2016-03-04: 09:00:00 via TOPICAL
  Filled 2016-03-03: qty 22

## 2016-03-03 NOTE — Care Management Note (Signed)
Case Management Note  Patient Details  Name: Candice Brewer MRN: [id number] Date of Birth: Jun 18, 1932  Subjective/Objective:    Spoke with patient an son in law Candice Brewer) who was present in the room.  Patients feeling poorly at this time but answers questions appropriately. Patient stated that she lives at home with family support. Son in law stated that he is there most of the time however he travels with his work and is gone for periods of time. Patient stated that she is normally independent and that she still drives. She denies issues with obtaining her medications.   PT has recommended home health PT. Patient denies the use of home DME.              Action/Plan: Home with Home Health   Expected Discharge Date:  03/01/16               Expected Discharge Plan:  [place]  In-House Referral:     Discharge planning Services  CM Consult  Post Acute Care Choice:    Choice offered to:     DME Arranged:    DME Agency:     HH Arranged:    HH Agency:     Status of Service:  In process, will continue to follow  Medicare Important Message Given:    Date Medicare IM Given:    Medicare IM give by:    Date Additional Medicare IM Given:    Additional Medicare Important Message give by:     If discussed at [place] of Stay Meetings, dates discussed:    Additional Comments:  Alvie Heidelberg, RN 03/03/2016, 12:06 PM

## 2016-03-03 NOTE — Care Management (Signed)
Home health referral placed with [facility] , [place] notified.

## 2016-03-03 NOTE — Progress Notes (Signed)
Pt temperature 101.7, PRN Tylenol given.  Notified Dr Jerilee Hoh.  Will continue to monitor pt.

## 2016-03-03 NOTE — Progress Notes (Signed)
[facility] PROGRESS NOTE  Candice Brewer [id number] DOB: 12/31/1931 DOA: 02/27/2016 PCP: Robert Bellow, MD  Assessment/Plan: MRSA community-acquired pneumonia -Patient seems to be improving, will discontinue Zosyn and continue vancomycin for now. -Plan to watch in hospital overnight and if remains afebrile and conditions remained favorable will consider possible discharge home over the next 24-48 hours. -Has been seen by speech therapy without indication for ongoing aspiration.  Type 2 diabetes -Fair control.  Hypertension -Continue home medications, do not anticipate further medication changes while in the hospital.  Code Status: Full code Family Communication: Son-in-law at bedside updated on plan of care and all questions answered  Disposition Plan: Hopefully discharge home in 24-48 hours   Consultants:  None   Antibiotics:  Vancomycin   Subjective: Feels little improved, was able to walk down the hallway with physical therapy  Objective: Filed Vitals:   03/03/16 1209 03/03/16 1343 03/03/16 1556 03/03/16 1648  BP:  145/68 149/68 149/68  Pulse:  99 104 104  Temp: 99.3 F (37.4 C) 99.1 F (37.3 C)    TempSrc: Oral Oral    Resp:  20    Height:      Weight:      SpO2:  97% 96% 96%    Intake/Output Summary (Last 24 hours) at 03/03/16 1715 Last data filed at 03/03/16 1500  Gross per 24 hour  Intake 1573.33 ml  Output   1725 ml  Net -151.67 ml   Filed Weights   02/27/16 0951 02/28/16 1620  Weight: 67.132 kg (148 lb) 69.8 kg (153 lb 14.1 oz)    Exam:   General:  Alert, awake, oriented 3  Cardiovascular: Regular rate and rhythm  Respiratory: Clear to auscultation bilaterally  Abdomen: Soft, nontender, nondistended, positive bowel sounds  Extremities: No clubbing, cyanosis or edema, positive pulses   Neurologic:  Grossly intact and nonfocal  Data Reviewed: Basic Metabolic Panel:  Recent Labs Lab 02/27/16 1150  02/28/16 0514 02/29/16 0638 03/02/16 0552 03/03/16 0614  NA 134* 134* 134* 135 135  K 3.6 3.8 3.8 3.7 3.6  CL 101 104 104 103 99*  CO2 23 23 22  21* 24  GLUCOSE 185* 222* 154* 179* 199*  BUN 14 11 11 11 9   CREATININE 0.90 0.83 0.79 0.83 0.90  CALCIUM 8.9 8.5* 8.6* 8.4* 9.0   Liver Function Tests:  Recent Labs Lab 02/27/16 1150  AST 21  ALT 15  ALKPHOS 71  BILITOT 1.0  PROT 7.4  ALBUMIN 3.9   No results for input(s): LIPASE, AMYLASE in the last 168 hours. No results for input(s): AMMONIA in the last 168 hours. CBC:  Recent Labs Lab 02/27/16 1150 02/28/16 0514 02/29/16 [id number] 03/01/16 0518 03/02/16 0552 03/03/16 0614  WBC 15.2* 18.0* 15.9* 12.9* 12.1* 16.5*  NEUTROABS 12.5*  --   --   --   --   --   HGB 12.0 11.4* 11.3* 11.2* 10.5* 11.9*  HCT 34.4* 33.6* 33.3* 32.9* 30.4* 33.9*  MCV 97.7 98.0 98.2 98.5 96.2 96.3  PLT 169 160 177 200 237 331   Cardiac Enzymes:  Recent Labs Lab 02/27/16 1156  TROPONINI <0.03   BNP (last 3 results) No results for input(s): BNP in the last 8760 hours.  ProBNP (last 3 results) No results for input(s): PROBNP in the last 8760 hours.  CBG:  Recent Labs Lab 03/02/16 1612 03/02/16 2021 03/03/16 0749 03/03/16 1131 03/03/16 1642  GLUCAP 175* 178* 143* 203* 155*    Recent Results (from the  past 240 hour(s))  Blood culture (routine x 2)     Status: None   Collection Time: 02/27/16 11:51 AM  Result Value Ref Range Status   Specimen Description BLOOD RIGHT ARM  Final   Special Requests BOTTLES DRAWN AEROBIC AND ANAEROBIC 10CC EACH  Final   Culture NO GROWTH 5 DAYS  Final   Report Status 03/03/2016 FINAL  Final  Blood culture (routine x 2)     Status: None   Collection Time: 02/27/16 12:31 PM  Result Value Ref Range Status   Specimen Description BLOOD RIGHT ARM  Final   Special Requests BOTTLES DRAWN AEROBIC AND ANAEROBIC 8CC EACH  Final   Culture NO GROWTH 5 DAYS  Final   Report Status 03/03/2016 FINAL  Final  Culture,  sputum-assessment     Status: None   Collection Time: 02/28/16  1:38 PM  Result Value Ref Range Status   Specimen Description TRACHEAL SITE  Final   Special Requests NONE  Final   Sputum evaluation THIS SPECIMEN IS ACCEPTABLE FOR SPUTUM CULTURE  Final   Report Status 02/28/2016 FINAL  Final  Culture, respiratory (NON-Expectorated)     Status: None   Collection Time: 02/28/16  1:38 PM  Result Value Ref Range Status   Specimen Description SPUTUM  Final   Special Requests NONE  Final   Gram Stain   Final    ABUNDANT WBC PRESENT, PREDOMINANTLY PMN RARE SQUAMOUS EPITHELIAL CELLS PRESENT MODERATE GRAM POSITIVE COCCI IN PAIRS IN CLUSTERS Performed at [facility]    Culture   Final    MODERATE METHICILLIN RESISTANT STAPHYLOCOCCUS AUREUS Note: RIFAMPIN AND GENTAMICIN SHOULD NOT BE USED AS SINGLE DRUGS FOR TREATMENT OF STAPH INFECTIONS. This organism DOES NOT demonstrate inducible Clindamycin resistance in vitro. CRITICAL RESULT CALLED TO, READ BACK BY AND VERIFIED WITH: VALDEZ 04.04.17  0850 BY PERCN Performed at [facility]    Report Status 03/02/2016 FINAL  Final   Organism ID, Bacteria METHICILLIN RESISTANT STAPHYLOCOCCUS AUREUS  Final      Susceptibility   Methicillin resistant staphylococcus aureus - MIC*    CLINDAMYCIN <=0.25 SENSITIVE Sensitive     ERYTHROMYCIN >=8 RESISTANT Resistant     GENTAMICIN <=0.5 SENSITIVE Sensitive     LEVOFLOXACIN 4 INTERMEDIATE Intermediate     OXACILLIN >=4 RESISTANT Resistant     RIFAMPIN <=0.5 SENSITIVE Sensitive     TRIMETH/SULFA <=10 SENSITIVE Sensitive     VANCOMYCIN 1 SENSITIVE Sensitive     TETRACYCLINE <=1 SENSITIVE Sensitive     * MODERATE METHICILLIN RESISTANT STAPHYLOCOCCUS AUREUS  MRSA PCR Screening     Status: None   Collection Time: 03/01/16  5:05 PM  Result Value Ref Range Status   MRSA by PCR NEGATIVE NEGATIVE Final    Comment:        The GeneXpert MRSA Assay (FDA approved for NASAL specimens only), is one  component of a comprehensive MRSA colonization surveillance program. It is not intended to diagnose MRSA infection nor to guide or monitor treatment for MRSA infections.   Culture, blood (Routine X 2) w Reflex to ID Panel     Status: None (Preliminary result)   Collection Time: 03/01/16  7:53 PM  Result Value Ref Range Status   Specimen Description BLOOD RIGHT ARM  Final   Special Requests BOTTLES DRAWN AEROBIC AND ANAEROBIC 6CC  Final   Culture NO GROWTH 2 DAYS  Final   Report Status PENDING  Incomplete  Culture, blood (Routine X 2) w Reflex to  ID Panel     Status: None (Preliminary result)   Collection Time: 03/01/16  7:57 PM  Result Value Ref Range Status   Specimen Description BLOOD LEFT ARM  Final   Special Requests BOTTLES DRAWN AEROBIC AND ANAEROBIC 6CC  Final   Culture NO GROWTH 2 DAYS  Final   Report Status PENDING  Incomplete     Studies: Dg Chest 2 View  03/01/2016  CLINICAL DATA:  Cough and chest congestion, several days duration. EXAM: CHEST  2 VIEW COMPARISON:  02/27/2016 FINDINGS: There is worsened consolidation in the right middle lobe and right lower lobe, with development of a small right pleural effusion since 02/27/2016. The left lung is clear. Hilar and mediastinal contours are unremarkable. IMPRESSION: Worsening right middle lobe and right lower lobe consolidation. Developing small effusion. Probable pneumonia. Electronically Signed   By: Andreas Newport M.D.   On: 03/01/2016 18:36    Scheduled Meds: . acyclovir  800 mg Oral BID  . albuterol  2.5 mg Nebulization 3 times per day  . aspirin  81 mg Oral Daily  . benzonatate  100 mg Oral TID  . brimonidine  1 drop Both Eyes BID  . cloNIDine  0.1 mg Oral TID  . enalapril  20 mg Oral Daily  . enoxaparin (LOVENOX) injection  40 mg Subcutaneous Q24H  . guaiFENesin  600 mg Oral BID  . insulin aspart  0-9 Units Subcutaneous TID WC  . insulin glargine  10 Units Subcutaneous BID  . latanoprost  1 drop Both Eyes QHS   . multivitamin with minerals  1 tablet Oral Daily  . prednisoLONE acetate  1 drop Both Eyes BID  . senna-docusate  1 tablet Oral BID  . timolol  1 drop Both Eyes BID  . vancomycin  750 mg Intravenous Q12H   Continuous Infusions: . 0.9 % NaCl with KCl 20 mEq / L 50 mL/hr at 03/03/16 1042    Principal Problem:   CAP (community acquired pneumonia) Active Problems:   Type 2 diabetes mellitus without complication (HCC)   Essential hypertension   Pneumonia   Pleuritic chest pain   Staphylococcus aureus infection    Time spent: 25 minutes. Greater than 50% of this time was spent in direct contact with the patient coordinating care.    Lelon Frohlich  [facility] Pager [phone]  If 7PM-7AM, please contact night-coverage at [email or web address], password [facility] 03/03/2016, 5:15 PM  LOS: 4 days

## 2016-03-03 NOTE — Progress Notes (Signed)
Physical Therapy Treatment Patient Details Name: Candice Brewer MRN: [id number] DOB: 1932-05-25 Today's Date: 03/03/2016    History of Present Illness 82 yo F admitted with PNA    PT Comments    Pt received sitting up in the chair, son-in-law present, and pt was agreeable to PT tx.  Pt expressed that she lives alone, and she was normally very active in the community prior to this admission.  She states she was going to the [facility] 3 days per week to do exercises, and wants to be able to keep up with her grandchildren.  Pt states that at the start of this tx her pain was at a 10/10, but at the end of tx it was reduced to a 4/10 with mobility. Pt tolerating treatment session well, motivated and able to complete entire PT sesssion as planned. Pt demonstrating no progress toward goals this session as evidenced by decreased activity tolerance, decreased ambulation distance, and presenting with more lethargy than at last session. Pt's greatest limitation continues to be global weakness which continues to limit ability to perform functional mobility and ADL at baseline level of function. Patient presenting with impairment of strength, pain, range of motion, balance, and activity tolerance, limiting ability to perform ADL and mobility tasks at  baseline level of function. Patient will continue to benefit from skilled intervention to address the above impairments and limitations, in order to restore to prior level of function, improve patient safety upon discharge, and to decrease caregiver burden.  *Goals and discharge recommendations may need to be updated based on performance at next treatment session.     Follow Up Recommendations  Home health PT     Equipment Recommendations  Rolling walker with 5" wheels    Recommendations for Other Services       Precautions / Restrictions Precautions Precautions: None;Fall    Mobility  Bed Mobility               General bed mobility comments: Up in  the chair upon entry.    Transfers Overall transfer level: Needs assistance (vc's for correct technique for pt to push up from the chair. ) Equipment used: Rolling walker (2 wheeled) Transfers: Sit to/from Stand Sit to Stand: Min guard (x2 trials due to needing to be cleaned up after being on the bedpan in the chair. )            Ambulation/Gait Ambulation/Gait assistance: Min guard Ambulation Distance (Feet): 50 Feet Assistive device: Rolling walker (2 wheeled) Gait Pattern/deviations: Step-to pattern;Trunk flexed Gait velocity: Extreme decrease in cadence noted today with decrease in endurance also noted.   Gait velocity interpretation: <1.8 ft/sec, indicative of risk for recurrent falls General Gait Details: Pt has difficulty with dual task (ie: walking at talking) due to fatigue and mild breathlessness.   Stairs            Wheelchair Mobility    Modified Rankin (Stroke Patients Only)       Balance Overall balance assessment: Needs assistance         Standing balance support: Single extremity supported;During functional activity Standing balance-Leahy Scale: Good                      Cognition Arousal/Alertness: Awake/alert Behavior During Therapy: WFL for tasks assessed/performed Overall Cognitive Status: Within Functional Limits for tasks assessed                      Exercises General  Exercises - Lower Extremity Hip Flexion/Marching: Strengthening;Both;5 reps;Standing Heel Raises: Strengthening;Both;5 reps;Standing    General Comments        Pertinent Vitals/Pain Pain Assessment: 0-10 Pain Score: 4  (Pt stated that her pain started at a 10/10, however after tx it was reduced to 4/10) Pain Location: Lower back and down into LE's at times, and chest Pain Descriptors / Indicators: Sharp Pain Intervention(s): Limited activity within patient's tolerance;Repositioned;Monitored during session   BP 149/68, HR: 104 bpm, and SpO2: 96%  on RA.  All vitals taken after ambulation.      Home Living                      Prior Function            PT Goals (current goals can now be found in the care plan section) Progress towards PT goals: Not progressing toward goals - comment;PT to reassess next treatment    Frequency  Min 3X/week    PT Plan Current plan remains appropriate    Co-evaluation             End of Session Equipment Utilized During Treatment: Gait belt Activity Tolerance: Patient tolerated treatment well;Patient limited by fatigue Patient left: in chair;with family/visitor present     Time: [id number] PT Time Calculation (min) (ACUTE ONLY): 41 min  Charges:  $Gait Training: 8-22 mins $Therapeutic Exercise: 8-22 mins $Therapeutic Activity: 8-22 mins                    G Codes:       Tx completed in part by [employer], PT for the purposed employee training.   7:17 PM, 03/03/2016 Etta Grandchild, PT, DPT PRN Physical Therapist at [place] License # [id number] (wireless)  [phone] (mobile)

## 2016-03-04 DIAGNOSIS — A4901 Methicillin susceptible Staphylococcus aureus infection, unspecified site: Secondary | ICD-10-CM

## 2016-03-04 LAB — CBC
HCT: 30.6 % — ABNORMAL LOW (ref 36.0–46.0)
Hemoglobin: 10.6 g/dL — ABNORMAL LOW (ref 12.0–15.0)
MCH: 33 pg (ref 26.0–34.0)
MCHC: 34.6 g/dL (ref 30.0–36.0)
MCV: 95.3 fL (ref 78.0–100.0)
PLATELETS: 325 10*3/uL (ref 150–400)
RBC: 3.21 MIL/uL — ABNORMAL LOW (ref 3.87–5.11)
RDW: 13.3 % (ref 11.5–15.5)
WBC: 15 10*3/uL — AB (ref 4.0–10.5)

## 2016-03-04 LAB — BASIC METABOLIC PANEL
Anion gap: 9 (ref 5–15)
BUN: 10 mg/dL (ref 6–20)
CALCIUM: 8.6 mg/dL — AB (ref 8.9–10.3)
CO2: 23 mmol/L (ref 22–32)
CREATININE: 0.85 mg/dL (ref 0.44–1.00)
Chloride: 104 mmol/L (ref 101–111)
GFR calc Af Amer: >60 mL/min (ref >60)
Glucose, Bld: 148 mg/dL — ABNORMAL HIGH (ref 65–99)
POTASSIUM: 3.6 mmol/L (ref 3.5–5.1)
SODIUM: 136 mmol/L (ref 135–145)

## 2016-03-04 LAB — GLUCOSE, CAPILLARY
GLUCOSE-CAPILLARY: 156 mg/dL — AB (ref 65–99)
Glucose-Capillary: 137 mg/dL — ABNORMAL HIGH (ref 65–99)

## 2016-03-04 LAB — VANCOMYCIN, TROUGH: VANCOMYCIN TR: 10 ug/mL (ref 10.0–20.0)

## 2016-03-04 MED ORDER — GUAIFENESIN ER 600 MG PO TB12: 600.0000 mg | ORAL_TABLET | Freq: Two times a day (BID) | ORAL | Status: DC

## 2016-03-04 MED ORDER — SULFAMETHOXAZOLE-TRIMETHOPRIM 800-160 MG PO TABS: 1.0000 | ORAL_TABLET | Freq: Two times a day (BID) | ORAL | Status: DC

## 2016-03-04 MED ORDER — BENZONATATE 100 MG PO CAPS: 100.0000 mg | ORAL_CAPSULE | Freq: Three times a day (TID) | ORAL | Status: DC | PRN

## 2016-03-04 MED ORDER — VANCOMYCIN HCL IN DEXTROSE 1-5 GM/200ML-% IV SOLN: 1000.0000 mg | Freq: Two times a day (BID) | INTRAVENOUS | Status: DC

## 2016-03-04 MED ORDER — OXYCODONE HCL 5 MG PO TABS: 5.0000 mg | ORAL_TABLET | ORAL | Status: DC | PRN

## 2016-03-04 NOTE — Discharge Summary (Signed)
Physician Discharge Summary  Candice Brewer [id number] DOB: [birth year] DOA: 02/27/2016  PCP: Robert Bellow, MD  Admit date: 02/27/2016 Discharge date: 03/04/2016  Time spent: 45 minutes  Recommendations for Outpatient Follow-up:  -We'll be discharged home today. -Advised to follow-up with primary care provider in 2 weeks. -Will be sent home with 8 days worth of Bactrim and I would recommend repeat chest x-ray in 4-6 weeks to ensure complete resolution of pneumonia.   Discharge Diagnoses:  Principal Problem:   CAP (community acquired pneumonia) Active Problems:   Type 2 diabetes mellitus without complication ([place])   Essential hypertension   Pneumonia   Pleuritic chest pain   Staphylococcus aureus infection   Discharge Condition: Stable and improved  Filed Weights   02/27/16 0951 02/28/16 1620  Weight: 67.132 kg (148 lb) 69.8 kg (153 lb 14.1 oz)    History of present illness:  As per Dr. [place] Pink on on 3/31: Candice Brewer is a 82 y.o. female with a past medical history of hypertension and diabetes who was in her usual state of health yesterday when she started developing pain in the right side of her chest as well as generalized body aches. She had a cough with yellowish expectoration. Had some difficulty breathing. Denies any blood in the sputum. She had a fever up to 101F. Denies any chills. No nausea or vomiting. No leg swelling. She visited her aunt about 2 weeks ago in the nursing facility and her aunt had pneumonia. She goes to the [facility] on a regular basis. She had 2 loose stools yesterday but none today. Denies any history of smoking currently. No history of recent hospitalization within the last 3 months. She states that she is usually in good health. Pain was 8 out of 10 intensity in the right side of the chest. It was worse with deep breathing and cough. Described as a sharp pain. Pain has improved significantly since she has been in the emergency  department.  In the emergency department, patient's workup included blood work and x-ray. She was found to have leukocytosis. Chest x-ray showed right-sided pneumonia. Due to her some generalized weakness and the fact that she lives alone, she will be brought into the hospital for treatment.  Hospital Course:   MRSA community-acquired pneumonia -Patient seems to be improving. -Sputum cultures have confirmed MRSA which is sensitive to multiple oral antibiotics. -She remains afebrile for at least 72 hours, leukocytosis is improving. -We'll elect to discharge home today with an additional 8 day course of Bactrim.  Type 2 diabetes -Fair control.  Hypertension -Continue home medications, do not anticipate further medication changes while in the hospital.   Procedures:  None   Consultations:  None  Discharge Instructions  Discharge Instructions    Diet - low sodium heart healthy    Complete by:  As directed      Increase activity slowly    Complete by:  As directed             Medication List    TAKE these medications        acyclovir 800 MG tablet  Commonly known as:  ZOVIRAX  Take 800 mg by mouth daily. TAKE 1 TABLET TWICE A DAY     aspirin 81 MG tablet  Take 81 mg by mouth daily.     benzonatate 100 MG capsule  Commonly known as:  TESSALON  Take 1 capsule (100 mg total) by mouth 3 (three) times daily as needed for  cough.     cholecalciferol 1000 units tablet  Commonly known as:  VITAMIN D  Take 1,000 Units by mouth daily.     cloNIDine 0.1 MG tablet  Commonly known as:  CATAPRES  Take 0.1 mg by mouth 2 (two) times daily.     COMBIGAN 0.2-0.5 % ophthalmic solution  Generic drug:  brimonidine-timolol  Place 1 drop into both eyes every 12 (twelve) hours.     enalapril 20 MG tablet  Commonly known as:  VASOTEC  Take 20 mg by mouth daily.     guaiFENesin 600 MG 12 hr tablet  Commonly known as:  MUCINEX  Take 1 tablet (600 mg total) by mouth 2 (two) times  daily.     latanoprost 0.005 % ophthalmic solution  Commonly known as:  XALATAN  INSTILL 1 DROP IN BOTH EYES NIGHTLY     metFORMIN 500 MG 24 hr tablet  Commonly known as:  GLUCOPHAGE-XR  Take 1,000 mg by mouth 2 (two) times daily.     multivitamin capsule  Take 1 capsule by mouth daily.     oxyCODONE 5 MG immediate release tablet  Commonly known as:  Oxy IR/ROXICODONE  Take 1 tablet (5 mg total) by mouth every 4 (four) hours as needed for moderate pain.     Polyethyl Glycol-Propyl Glycol 0.4-0.3 % Soln  Apply 1 drop to eye daily as needed (dry eyes).     prednisoLONE acetate 1 % ophthalmic suspension  Commonly known as:  PRED FORTE  1 drop in the right eye twice a day, 1 drop in the left eye twice a day on Mondays and Fridays.     sulfamethoxazole-trimethoprim 800-160 MG tablet  Commonly known as:  BACTRIM DS,SEPTRA DS  Take 1 tablet by mouth 2 (two) times daily.       Allergies  Allergen Reactions  . Neomycin-Bacitracin Zn-Polymyx     REACTION: "Severe skin rash"  . Pravastatin Sodium     REACTION: "Funny feeling and leg pain"  . Rosuvastatin Rash       Follow-up Information    Follow up with Robert Bellow, MD. Schedule an appointment as soon as possible for a visit in 2 weeks.   Specialty:  Family Medicine   Contact information:   [place] [id number] [phone]        The results of significant diagnostics from this hospitalization (including imaging, microbiology, ancillary and laboratory) are listed below for reference.    Significant Diagnostic Studies: Dg Chest 2 View  03/01/2016  CLINICAL DATA:  Cough and chest congestion, several days duration. EXAM: CHEST  2 VIEW COMPARISON:  02/27/2016 FINDINGS: There is worsened consolidation in the right middle lobe and right lower lobe, with development of a small right pleural effusion since 02/27/2016. The left lung is clear. Hilar and mediastinal contours are unremarkable. IMPRESSION:  Worsening right middle lobe and right lower lobe consolidation. Developing small effusion. Probable pneumonia. Electronically Signed   By: Andreas Newport M.D.   On: 03/01/2016 18:36   Dg Chest 2 View  02/27/2016  CLINICAL DATA:  82 year old female with cough fever, chest congestion and pain since yesterday. Initial encounter. EXAM: CHEST  2 VIEW COMPARISON:  04/23/2011. FINDINGS: Right middle lobe consolidation. No pleural effusion. Right hilar contour appears stable. Other mediastinal contours are stable with borderline to mild cardiomegaly. Visualized tracheal air column is within normal limits. No pneumothorax or pulmonary edema. Incidental nipple shadows. Lung parenchyma elsewhere is stable and within normal limits.  No acute osseous abnormality identified. IMPRESSION: Right middle lobe pneumonia.  No pleural effusion. Followup PA and lateral chest X-ray is recommended in 3-4 weeks following trial of antibiotic therapy to ensure resolution and exclude underlying malignancy. Electronically Signed   By: Genevie Ann M.D.   On: 02/27/2016 10:17    Microbiology: Recent Results (from the past 240 hour(s))  Blood culture (routine x 2)     Status: None   Collection Time: 02/27/16 11:51 AM  Result Value Ref Range Status   Specimen Description BLOOD RIGHT ARM  Final   Special Requests BOTTLES DRAWN AEROBIC AND ANAEROBIC 10CC EACH  Final   Culture NO GROWTH 5 DAYS  Final   Report Status 03/03/2016 FINAL  Final  Blood culture (routine x 2)     Status: None   Collection Time: 02/27/16 12:31 PM  Result Value Ref Range Status   Specimen Description BLOOD RIGHT ARM  Final   Special Requests BOTTLES DRAWN AEROBIC AND ANAEROBIC 8CC EACH  Final   Culture NO GROWTH 5 DAYS  Final   Report Status 03/03/2016 FINAL  Final  Culture, sputum-assessment     Status: None   Collection Time: 02/28/16  1:38 PM  Result Value Ref Range Status   Specimen Description TRACHEAL SITE  Final   Special Requests NONE  Final    Sputum evaluation THIS SPECIMEN IS ACCEPTABLE FOR SPUTUM CULTURE  Final   Report Status 02/28/2016 FINAL  Final  Culture, respiratory (NON-Expectorated)     Status: None   Collection Time: 02/28/16  1:38 PM  Result Value Ref Range Status   Specimen Description SPUTUM  Final   Special Requests NONE  Final   Gram Stain   Final    ABUNDANT WBC PRESENT, PREDOMINANTLY PMN RARE SQUAMOUS EPITHELIAL CELLS PRESENT MODERATE GRAM POSITIVE COCCI IN PAIRS IN CLUSTERS Performed at [facility]    Culture   Final    MODERATE METHICILLIN RESISTANT STAPHYLOCOCCUS AUREUS Note: RIFAMPIN AND GENTAMICIN SHOULD NOT BE USED AS SINGLE DRUGS FOR TREATMENT OF STAPH INFECTIONS. This organism DOES NOT demonstrate inducible Clindamycin resistance in vitro. CRITICAL RESULT CALLED TO, READ BACK BY AND VERIFIED WITH: VALDEZ 04.04.17  0850 BY PERCN Performed at [facility]    Report Status 03/02/2016 FINAL  Final   Organism ID, Bacteria METHICILLIN RESISTANT STAPHYLOCOCCUS AUREUS  Final      Susceptibility   Methicillin resistant staphylococcus aureus - MIC*    CLINDAMYCIN <=0.25 SENSITIVE Sensitive     ERYTHROMYCIN >=8 RESISTANT Resistant     GENTAMICIN <=0.5 SENSITIVE Sensitive     LEVOFLOXACIN 4 INTERMEDIATE Intermediate     OXACILLIN >=4 RESISTANT Resistant     RIFAMPIN <=0.5 SENSITIVE Sensitive     TRIMETH/SULFA <=10 SENSITIVE Sensitive     VANCOMYCIN 1 SENSITIVE Sensitive     TETRACYCLINE <=1 SENSITIVE Sensitive     * MODERATE METHICILLIN RESISTANT STAPHYLOCOCCUS AUREUS  MRSA PCR Screening     Status: None   Collection Time: 03/01/16  5:05 PM  Result Value Ref Range Status   MRSA by PCR NEGATIVE NEGATIVE Final    Comment:        The GeneXpert MRSA Assay (FDA approved for NASAL specimens only), is one component of a comprehensive MRSA colonization surveillance program. It is not intended to diagnose MRSA infection nor to guide or monitor treatment for MRSA infections.   Culture,  blood (Routine X 2) w Reflex to ID Panel     Status: None (Preliminary result)  Collection Time: 03/01/16  7:53 PM  Result Value Ref Range Status   Specimen Description BLOOD RIGHT ARM  Final   Special Requests BOTTLES DRAWN AEROBIC AND ANAEROBIC 6CC  Final   Culture NO GROWTH 3 DAYS  Final   Report Status PENDING  Incomplete  Culture, blood (Routine X 2) w Reflex to ID Panel     Status: None (Preliminary result)   Collection Time: 03/01/16  7:57 PM  Result Value Ref Range Status   Specimen Description BLOOD LEFT ARM  Final   Special Requests BOTTLES DRAWN AEROBIC AND ANAEROBIC 6CC  Final   Culture NO GROWTH 3 DAYS  Final   Report Status PENDING  Incomplete     Labs: Basic Metabolic Panel:  Recent Labs Lab 02/28/16 0514 02/29/16 0638 03/02/16 0552 03/03/16 0614 03/04/16 0556  NA 134* 134* 135 135 136  K 3.8 3.8 3.7 3.6 3.6  CL 104 104 103 99* 104  CO2 23 22 21* 24 23  GLUCOSE 222* 154* 179* 199* 148*  BUN 11 11 11 9 10   CREATININE 0.83 0.79 0.83 0.90 0.85  CALCIUM 8.5* 8.6* 8.4* 9.0 8.6*   Liver Function Tests:  Recent Labs Lab 02/27/16 1150  AST 21  ALT 15  ALKPHOS 71  BILITOT 1.0  PROT 7.4  ALBUMIN 3.9   No results for input(s): LIPASE, AMYLASE in the last 168 hours. No results for input(s): AMMONIA in the last 168 hours. CBC:  Recent Labs Lab 02/27/16 1150  02/29/16 0638 03/01/16 0518 03/02/16 0552 03/03/16 0614 03/04/16 0556  WBC 15.2*  < > 15.9* 12.9* 12.1* 16.5* 15.0*  NEUTROABS 12.5*  --   --   --   --   --   --   HGB 12.0  < > 11.3* 11.2* 10.5* 11.9* 10.6*  HCT 34.4*  < > 33.3* 32.9* 30.4* 33.9* 30.6*  MCV 97.7  < > 98.2 98.5 96.2 96.3 95.3  PLT 169  < > 177 200 237 331 325  < > = values in this interval not displayed. Cardiac Enzymes:  Recent Labs Lab 02/27/16 1156  TROPONINI <0.03   BNP: BNP (last 3 results) No results for input(s): BNP in the last 8760 hours.  ProBNP (last 3 results) No results for input(s): PROBNP in the last  8760 hours.  CBG:  Recent Labs Lab 03/03/16 1131 03/03/16 1642 03/03/16 2044 03/04/16 0732 03/04/16 1153  GLUCAP 203* 155* 191* 156* 137*       Signed:  HERNANDEZ ACOSTA,Cierah Crader  [facility] Pager: [phone] 03/04/2016, 2:04 PM

## 2016-03-04 NOTE — Progress Notes (Signed)
Patient states understanding of discharge instructions, prescriptions given 

## 2016-03-04 NOTE — Care Management Important Message (Signed)
Important Message  Patient Details  Name: Candice Brewer MRN: [id number] Date of Birth: 11/06/1932   Medicare Important Message Given:  Yes    Alvie Heidelberg, RN 03/04/2016, 12:56 PM

## 2016-03-04 NOTE — Consult Note (Signed)
   [facility] CM Inpatient Consult   03/04/2016  Candice Brewer June 04, 1932 [id number]  Patient screened [facility] care management services, however this patient is not eligible for [facility] Program services due to primary care provider, Dr. Karie Kirks, is no longer a [facility] provider. Patient confirms she continues to see Dr. Karie Kirks.  Candice Brewer. Candice Purser, RN, BSN, [facility] Liaison [phone]

## 2016-03-04 NOTE — Progress Notes (Signed)
ANTIBIOTIC CONSULT NOTE - follow up  Pharmacy Consult for Vancomycin  Indication: pneumonia  Allergies  Allergen Reactions  . Neomycin-Bacitracin Zn-Polymyx     REACTION: "Severe skin rash"  . Pravastatin Sodium     REACTION: "Funny feeling and leg pain"  . Rosuvastatin Rash   Patient Measurements: Height: 5\' 7"  (170.2 cm) Weight: 153 lb 14.1 oz (69.8 kg) IBW/kg (Calculated) : 61.6  Vital Signs: Temp Source: Oral (04/06 0651) BP: 173/80 mmHg (04/06 0651) Pulse Rate: 97 (04/06 0651) Intake/Output from previous day: 04/05 0701 - 04/06 0700 In: 2884.2 [P.O.:960; I.V.:1624.2; IV Piggyback:300] Out: 1000 [Urine:1000] Intake/Output from this shift:    Labs:  Recent Labs  03/02/16 0552 03/03/16 0614 03/04/16 0556  WBC 12.1* 16.5* 15.0*  HGB 10.5* 11.9* 10.6*  PLT 237 331 325  CREATININE 0.83 0.90 0.85   Estimated Creatinine Clearance: 50.5 mL/min (by C-G formula based on Cr of 0.85).  Recent Labs  03/04/16 0556  [facility] 10    Microbiology: Recent Results (from the past 720 hour(s))  Blood culture (routine x 2)     Status: None   Collection Time: 02/27/16 11:51 AM  Result Value Ref Range Status   Specimen Description BLOOD RIGHT ARM  Final   Special Requests BOTTLES DRAWN AEROBIC AND ANAEROBIC 10CC EACH  Final   Culture NO GROWTH 5 DAYS  Final   Report Status 03/03/2016 FINAL  Final  Blood culture (routine x 2)     Status: None   Collection Time: 02/27/16 12:31 PM  Result Value Ref Range Status   Specimen Description BLOOD RIGHT ARM  Final   Special Requests BOTTLES DRAWN AEROBIC AND ANAEROBIC 8CC EACH  Final   Culture NO GROWTH 5 DAYS  Final   Report Status 03/03/2016 FINAL  Final  Culture, sputum-assessment     Status: None   Collection Time: 02/28/16  1:38 PM  Result Value Ref Range Status   Specimen Description TRACHEAL SITE  Final   Special Requests NONE  Final   Sputum evaluation THIS SPECIMEN IS ACCEPTABLE FOR SPUTUM CULTURE  Final   Report  Status 02/28/2016 FINAL  Final  Culture, respiratory (NON-Expectorated)     Status: None   Collection Time: 02/28/16  1:38 PM  Result Value Ref Range Status   Specimen Description SPUTUM  Final   Special Requests NONE  Final   Gram Stain   Final    ABUNDANT WBC PRESENT, PREDOMINANTLY PMN RARE SQUAMOUS EPITHELIAL CELLS PRESENT MODERATE GRAM POSITIVE COCCI IN PAIRS IN CLUSTERS Performed at [facility]    Culture   Final    MODERATE METHICILLIN RESISTANT STAPHYLOCOCCUS AUREUS Note: RIFAMPIN AND GENTAMICIN SHOULD NOT BE USED AS SINGLE DRUGS FOR TREATMENT OF STAPH INFECTIONS. This organism DOES NOT demonstrate inducible Clindamycin resistance in vitro. CRITICAL RESULT CALLED TO, READ BACK BY AND VERIFIED WITH: VALDEZ 04.04.17  0850 BY PERCN Performed at [facility]    Report Status 03/02/2016 FINAL  Final   Organism ID, Bacteria METHICILLIN RESISTANT STAPHYLOCOCCUS AUREUS  Final      Susceptibility   Methicillin resistant staphylococcus aureus - MIC*    CLINDAMYCIN <=0.25 SENSITIVE Sensitive     ERYTHROMYCIN >=8 RESISTANT Resistant     GENTAMICIN <=0.5 SENSITIVE Sensitive     LEVOFLOXACIN 4 INTERMEDIATE Intermediate     OXACILLIN >=4 RESISTANT Resistant     RIFAMPIN <=0.5 SENSITIVE Sensitive     TRIMETH/SULFA <=10 SENSITIVE Sensitive     VANCOMYCIN 1 SENSITIVE Sensitive  TETRACYCLINE <=1 SENSITIVE Sensitive     * MODERATE METHICILLIN RESISTANT STAPHYLOCOCCUS AUREUS  MRSA PCR Screening     Status: None   Collection Time: 03/01/16  5:05 PM  Result Value Ref Range Status   MRSA by PCR NEGATIVE NEGATIVE Final    Comment:        The GeneXpert MRSA Assay (FDA approved for NASAL specimens only), is one component of a comprehensive MRSA colonization surveillance program. It is not intended to diagnose MRSA infection nor to guide or monitor treatment for MRSA infections.   Culture, blood (Routine X 2) w Reflex to ID Panel     Status: None (Preliminary result)    Collection Time: 03/01/16  7:53 PM  Result Value Ref Range Status   Specimen Description BLOOD RIGHT ARM  Final   Special Requests BOTTLES DRAWN AEROBIC AND ANAEROBIC 6CC  Final   Culture NO GROWTH 2 DAYS  Final   Report Status PENDING  Incomplete  Culture, blood (Routine X 2) w Reflex to ID Panel     Status: None (Preliminary result)   Collection Time: 03/01/16  7:57 PM  Result Value Ref Range Status   Specimen Description BLOOD LEFT ARM  Final   Special Requests BOTTLES DRAWN AEROBIC AND ANAEROBIC 6CC  Final   Culture NO GROWTH 2 DAYS  Final   Report Status PENDING  Incomplete   Medical History: Past Medical History  Diagnosis Date  . Chest pain, unspecified   . Restless legs syndrome (RLS)   . Fibroids     uterus  . Arthritis   . Constipation   . HTN (hypertension)   . DM (diabetes mellitus) ([place])   . Hyperlipidemia   . Esophageal stricture 09/2011    schatzki's ring  . Vitamin D deficiency   . Helicobacter pylori gastritis 2012    Treated   Medications:  Scheduled:  . acyclovir  800 mg Oral BID  . albuterol  2.5 mg Nebulization 3 times per day  . aspirin  81 mg Oral Daily  . benzonatate  100 mg Oral TID  . brimonidine  1 drop Both Eyes BID  . cloNIDine  0.1 mg Oral TID  . enalapril  20 mg Oral Daily  . enoxaparin (LOVENOX) injection  40 mg Subcutaneous Q24H  . guaiFENesin  600 mg Oral BID  . insulin aspart  0-9 Units Subcutaneous TID WC  . insulin glargine  10 Units Subcutaneous BID  . latanoprost  1 drop Both Eyes QHS  . multivitamin with minerals  1 tablet Oral Daily  . prednisoLONE acetate  1 drop Both Eyes BID  . senna-docusate  1 tablet Oral BID  . timolol  1 drop Both Eyes BID  . vancomycin  1,000 mg Intravenous Q12H   Assessment: 82 yo female admitted with CAP, sputum culture MRSA.  Vancomycin per pharmacy consult started 03/01/16.  Trough level is below goal. Renal fxn is stable. WBC elevated.    Pharmacokinetic dosing service:  Vancomycin single  level analysis: Current dose being given: 750 mg Current dosing interval:  12 hrs Current infusion time (hrs): 1  Single level Trough Data:  Trough level obtained: 10 mcg/ml Desired trough: 15 mcg/ml  Estimated PK Parameters: --------------------------- New rate constant (kel): 0.078 hr-1 New half-life: 8.89 Hours New Vd from levels: 48.86  Liters  (0.7 L/kg)  Recommendations: ==================== Give Vancomycin  1000 mg  q 12 hrs. Infuse over 1 hrs Expected Ctrough: 15 mcg/ml  Recommended labs and intervals: Measure Bun  and Scr 3 times/week.   Thank you for the consult, will continue to follow.  Plan:   Increase Vancomycin to 1000mg  IV q12hrs  Check trough level weekly or sooner if warranted  Monitor labs, renal fxn and c/s  Duration of therapy per MD  Hart Robinsons A 03/04/2016,9:19 AM

## 2016-03-06 LAB — CULTURE, BLOOD (ROUTINE X 2)
Culture: NO GROWTH
Culture: NO GROWTH

## 2016-05-12 ENCOUNTER — Ambulatory Visit (HOSPITAL_COMMUNITY)
Admission: RE | Admit: 2016-05-12 | Discharge: 2016-05-12 | Disposition: A | Discharge status: Home / Self Care | Payer: Medicare Other | Source: Ambulatory Visit | Attending: Family Medicine | Admitting: Family Medicine

## 2016-05-12 ENCOUNTER — Other Ambulatory Visit (HOSPITAL_COMMUNITY): Payer: Self-pay | Admitting: Family Medicine

## 2016-05-12 DIAGNOSIS — J690 Pneumonitis due to inhalation of food and vomit: Secondary | ICD-10-CM

## 2016-05-12 DIAGNOSIS — J189 Pneumonia, unspecified organism: Secondary | ICD-10-CM | POA: Insufficient documentation

## 2016-11-10 ENCOUNTER — Other Ambulatory Visit (HOSPITAL_COMMUNITY): Payer: Self-pay | Admitting: Family Medicine

## 2016-11-10 DIAGNOSIS — Z1231 Encounter for screening mammogram for malignant neoplasm of breast: Secondary | ICD-10-CM

## 2017-01-24 ENCOUNTER — Ambulatory Visit (HOSPITAL_COMMUNITY)
Admission: RE | Admit: 2017-01-24 | Discharge: 2017-01-24 | Disposition: A | Discharge status: Home / Self Care | Payer: Medicare Other | Source: Ambulatory Visit | Attending: Family Medicine | Admitting: Family Medicine

## 2017-01-24 DIAGNOSIS — Z1231 Encounter for screening mammogram for malignant neoplasm of breast: Secondary | ICD-10-CM | POA: Diagnosis not present

## 2017-11-29 HISTORY — PX: BREAST EXCISIONAL BIOPSY: SUR124

## 2017-12-21 ENCOUNTER — Other Ambulatory Visit (HOSPITAL_COMMUNITY): Payer: Self-pay | Admitting: Family Medicine

## 2017-12-21 DIAGNOSIS — Z1231 Encounter for screening mammogram for malignant neoplasm of breast: Secondary | ICD-10-CM

## 2018-01-26 ENCOUNTER — Ambulatory Visit (HOSPITAL_COMMUNITY): Payer: Medicare Other

## 2018-02-02 ENCOUNTER — Ambulatory Visit (HOSPITAL_COMMUNITY)
Admission: RE | Admit: 2018-02-02 | Discharge: 2018-02-02 | Disposition: A | Discharge status: Home / Self Care | Payer: Medicare Other | Source: Ambulatory Visit | Attending: Family Medicine | Admitting: Family Medicine

## 2018-02-02 DIAGNOSIS — R928 Other abnormal and inconclusive findings on diagnostic imaging of breast: Secondary | ICD-10-CM | POA: Insufficient documentation

## 2018-02-02 DIAGNOSIS — Z1231 Encounter for screening mammogram for malignant neoplasm of breast: Secondary | ICD-10-CM | POA: Insufficient documentation

## 2018-02-06 ENCOUNTER — Other Ambulatory Visit (HOSPITAL_COMMUNITY): Payer: Self-pay | Admitting: Family Medicine

## 2018-02-06 DIAGNOSIS — R928 Other abnormal and inconclusive findings on diagnostic imaging of breast: Secondary | ICD-10-CM

## 2018-02-07 ENCOUNTER — Ambulatory Visit (HOSPITAL_COMMUNITY)
Admission: RE | Admit: 2018-02-07 | Discharge: 2018-02-07 | Disposition: A | Discharge status: Home / Self Care | Payer: Medicare Other | Source: Ambulatory Visit | Attending: Family Medicine | Admitting: Family Medicine

## 2018-02-07 DIAGNOSIS — R928 Other abnormal and inconclusive findings on diagnostic imaging of breast: Secondary | ICD-10-CM

## 2018-02-07 DIAGNOSIS — N6320 Unspecified lump in the left breast, unspecified quadrant: Secondary | ICD-10-CM | POA: Insufficient documentation

## 2018-02-08 ENCOUNTER — Other Ambulatory Visit (HOSPITAL_COMMUNITY): Payer: Self-pay | Admitting: Family Medicine

## 2018-02-08 DIAGNOSIS — R928 Other abnormal and inconclusive findings on diagnostic imaging of breast: Secondary | ICD-10-CM

## 2018-02-21 ENCOUNTER — Other Ambulatory Visit (HOSPITAL_COMMUNITY): Payer: Self-pay | Admitting: Family Medicine

## 2018-02-21 ENCOUNTER — Ambulatory Visit (HOSPITAL_COMMUNITY)
Admission: RE | Admit: 2018-02-21 | Discharge: 2018-02-21 | Disposition: A | Discharge status: Home / Self Care | Payer: Medicare Other | Source: Ambulatory Visit | Attending: Family Medicine | Admitting: Family Medicine

## 2018-02-21 DIAGNOSIS — R928 Other abnormal and inconclusive findings on diagnostic imaging of breast: Secondary | ICD-10-CM | POA: Diagnosis not present

## 2018-02-21 DIAGNOSIS — N6324 Unspecified lump in the left breast, lower inner quadrant: Secondary | ICD-10-CM | POA: Insufficient documentation

## 2018-02-21 DIAGNOSIS — N6012 Diffuse cystic mastopathy of left breast: Secondary | ICD-10-CM | POA: Insufficient documentation

## 2018-02-21 DIAGNOSIS — N6092 Unspecified benign mammary dysplasia of left breast: Secondary | ICD-10-CM | POA: Diagnosis not present

## 2018-02-21 DIAGNOSIS — N6320 Unspecified lump in the left breast, unspecified quadrant: Secondary | ICD-10-CM | POA: Diagnosis present

## 2018-02-21 MED ORDER — LIDOCAINE-EPINEPHRINE (PF) 1 %-1:200000 IJ SOLN
INTRAMUSCULAR | Status: AC
  Administered 2018-02-21: 5 mL
  Filled 2018-02-21: qty 30

## 2018-02-21 MED ORDER — LIDOCAINE HCL (PF) 1 % IJ SOLN
INTRAMUSCULAR | Status: AC
  Administered 2018-02-21: 5 mL
  Filled 2018-02-21: qty 5

## 2018-03-07 ENCOUNTER — Encounter: Payer: Self-pay | Admitting: General Surgery

## 2018-03-07 ENCOUNTER — Other Ambulatory Visit: Payer: Self-pay | Admitting: General Surgery

## 2018-03-07 ENCOUNTER — Ambulatory Visit (INDEPENDENT_AMBULATORY_CARE_PROVIDER_SITE_OTHER): Payer: Medicare Other | Admitting: General Surgery

## 2018-03-07 VITALS — BP 155/79 | HR 74 | Temp 98.4°F | Resp 18 | Ht 66.0 in | Wt 162.0 lb

## 2018-03-07 DIAGNOSIS — N6092 Unspecified benign mammary dysplasia of left breast: Secondary | ICD-10-CM | POA: Diagnosis not present

## 2018-03-07 DIAGNOSIS — N6099 Unspecified benign mammary dysplasia of unspecified breast: Secondary | ICD-10-CM

## 2018-03-07 NOTE — Patient Instructions (Signed)
Atypical Ductal Hyperplasia Atypical ductal hyperplasia is a condition in which some of the cells in your breast are unusual or abnormal. Compared to normal breast cells, the cells seen in atypical ductal hyperplasia have:  Faster growth.  Abnormal organization.  Increased numbers of cells.  Unusual sizes and shapes.  Atypical ductal hyperplasia is not a form of cancer. However, if it is not treated, it may turn into cancer. What are the causes? It is not known what causes atypical ductal hyperplasia. What increases the risk? Risk factors for atypical ductal hyperplasia include:  Age. Your risk increases as you get older.  Family history of breast cancer.  Having prior radiation treatments to your breasts or chest area.  Being overweight.  Using or having used hormones, such as estrogen.  Prior history of: ? Breast cancer. ? Noncancerous breast conditions. ? Dense breasts.  Drinking more than one alcoholic beverage per day.  Starting menstruation before the age of 34.  Never having given birth.  Giving birth to your first child when you were over the age of 12.  Not breastfeeding, if you have given birth.  Not exercising consistently.  What are the signs or symptoms? Atypical ductal hyperplasia does not cause any symptoms. How is this diagnosed? Atypical ductal hyperplasia is usually discovered during a routine X-ray study of the breasts (mammogram). If something concerning shows up on a mammogram, a biopsy is performed. This means that a small sample of breast tissue is removed and sent to a laboratory. In most cases, breast cells can be removed through a needle. Sometimes, a small cut (incision) will need to be made in the breast to remove a sample of breast tissue. Atypical ductal hyperplasia can then be diagnosed by examining the breast cells under a microscope. How is this treated? Atypical ductal hyperplasia treatment may include:  More frequent monitoring of  breast health. Methods of breast health monitoring include breast exams, mammograms, ultrasounds, and MRIs.  Medicines to keep the abnormal cells from spreading and becoming cancerous.  A lumpectomy. This is the removal of the area of abnormal cells, along with a ring of normal tissue.This may also be called breast-conserving surgery.  A simple mastectomy. This is the removal of breast tissue, the nipple, and the circle of colored tissue around the nipple (areola). Sometimes, one or more lymph nodes from under the arm are also removed.  Preventative mastectomy. This is the removal of both breasts. This is usually only done if you have a very high risk of developing breast cancer.  Follow these instructions at home:  Take medicines only as directed by your health care provider.  Keep all follow-up appointments as directed by your health care provider. This is important.  Limit alcohol intake to no more than 1 drink per day for nonpregnant women. One drink equals 12 ounces of beer, 5 ounces of wine, or 1 ounces of hard liquor. Contact a health care provider if: You have a fever. Get help right away if: You have difficulty breathing. This information is not intended to replace advice given to you by your health care provider. Make sure you discuss any questions you have with your health care provider. Document Released: 06/12/2014 Document Revised: 04/22/2016 Document Reviewed: 04/18/2014 Elsevier Interactive Patient Education  2018 [employer].  Breast Biopsy A breast biopsy is a test during which a sample of tissue is taken from your breast. The breast tissue is looked at under a microscope for cancer cells. What happens before the  procedure?  Plan to have someone take you home after the test.  Do not use tobacco products. These include cigarettes, chewing tobacco, or e-cigarettes. If you need help quitting, ask your doctor.  Do not drink alcohol for 24 hours before the  test.  Ask your doctor about: ? Changing or stopping your normal medicines. This is important if you take diabetes medicines or blood thinners. ? Taking medicines such as aspirin and ibuprofen. These medicines can thin your blood. Do not take these medicines before your procedure if your doctor tells you not to.  Wear a good support bra to the test.  Ask your doctor how your surgical site will be marked or identified.  You may be given antibiotic medicine to help prevent infection.  You may be checked for extra fluid in your body (lymphedema).  Your doctor may place a wire or a seed in the lump. The wire or seed gives off radiation. This will help your doctor to see the lump during the biopsy. What happens during the procedure? You may be given the following:  A medicine to numb the breast area (local anesthetic).  A medicine to help you relax (sedative).  There are different types of breast biopsies. Each type is described below. Fine-Needle Aspiration  A needle will be put into the breast lump.  The needle will take out fluid and cells from the lump. Core-Needle Biopsy  A needle will be put into the breast lump.  The needle will be put into your breast several times.  The needle will remove breast tissue. Stereotactic Biopsy  You will lie on a table on your belly. Your breast will pass through a hole in the table. Your breast will be held in place.  X-rays and a computer will be used to locate the breast lump.  A needle will be used to remove tissue samples from your breast. Vacuum-Assisted Biopsy  A small cut (incision) will be made in your breast.  A biopsy device will be put through the cut and into the breast tissue.  The biopsy device will draw abnormal breast tissue into the biopsy device.  A large tissue sample will often be removed.  No stitches will be needed. Ultrasound-Guided Core-Needle Biopsy  Ultrasound imaging will help guide the needle into the  area of the breast that is not normal.  A cut will be made in the breast. The needle will be put into the breast lump.  Tissue samples will be taken out. Surgical Biopsy  A cut will be made in the breast to remove tissue.  The cut will be closed with stitches and covered with a bandage.  There are two types: ? Incisional biopsy. Your doctor will remove part of the breast lump. ? Excisional biopsy. Your doctor will try to remove the whole breast lump or as much as possible. All tissue or fluid samples will be looked at under a microscope. What happens after the procedure?  You will be able to go home when you are doing well and you are not having problems.  You may have bruising on your breast. This is normal.  A pressure bandage (dressing) may be put on your breast. It may be left on for 24-48 hours. This type of bandage is wrapped tightly around your chest. It helps to stop fluid from building up under tissues. You may also need to wear a supportive bra during this time.  Do not drive for 24 hours if you received a sedative.  This information is not intended to replace advice given to you by your health care provider. Make sure you discuss any questions you have with your health care provider. Document Released: 02/07/2012 Document Revised: 07/22/2016 Document Reviewed: 08/19/2015 Elsevier Interactive Patient Education  [employer].

## 2018-03-07 NOTE — Progress Notes (Signed)
Candice Brewer: Atypical ductal hyperplasia of left breast  Referring Physician:  Dr. Judee Clara is a 84 y.o. female.  HPI: Candice Brewer is a very pleasant 84 yo who has recently undergone a mammogram with some suspicious findings, and ultimately underwent a diagnostic mammogram and ultrasound with biopsy of a left breast lesion.  She has never had any prior abnormal mammograms. She is otherwise relatively healthy, and is here to discuss surgical excision given the findings of hyperplasia.   The patient has no history of any masses, lumps, bumps, nipple changes or discharge. She had menarche at age 44, and her first pregnancy at age 63. She is G7P5. She has no history of any family breast cancer. She underwent menopause at unknown time.  She has never had any previous biopsies or concerning areas on mammogram.  She has not had any chest radiation.   Past Medical History:  Diagnosis Date  . Arthritis   . Chest pain, unspecified   . Constipation   . DM (diabetes mellitus) ([place])   . Esophageal stricture 09/2011   schatzki's ring  . Fibroids    uterus  . Helicobacter pylori gastritis 2012   Treated  . HTN (hypertension)   . Hyperlipidemia   . Restless legs syndrome (RLS)   . Vitamin D deficiency     Past Surgical History:  Procedure Laterality Date  . APPENDECTOMY    . CARDIOVASCULAR STRESS TEST  2009  . CATARACT EXTRACTION  2010   right eye  . CATARACT EXTRACTION  2012   left eye  . COLONOSCOPY  08/2006   Dr. Vivi Ferns  . COLONOSCOPY  10/13/2011   Normal. Next TCS 09/2016.   Procedure: COLONOSCOPY;  Surgeon: Daneil Dolin, MD;  Location: [facility];  Service: Endoscopy;  Laterality: N/A;  8:15  . COLONOSCOPY N/A 03/10/2015   RMR: External and anal canal hemorrhoids likely source of hematochezia. Redundant colon. Single sigmoid polyp removed ad described above.   . ESOPHAGOGASTRODUODENOSCOPY   06/2008   Dr. Lorrin Mais ring s/p disruption, erosive reflux esophagitis, hh.   . ESOPHAGOGASTRODUODENOSCOPY  09/2011   Schatki's ring s/p dilation, multiple 1-90mm antral and bulbar erosions, bx positive for H.Pylori s/ treatment  . TUBAL LIGATION      Family History  Problem Relation Age of Onset  . Prostate cancer Father   . Colon cancer Father        >age 43  . Cancer Paternal Grandmother        metastatic at time of diagnosis, primary unknown  . Cancer Paternal Aunt        metastatic at time of diagnosisi, primary unknown    Social History   Tobacco Use  . Smoking status: Never Smoker  . Smokeless tobacco: Never Used  . Tobacco comment: quit about 35+ yrs  Substance Use Topics  . Alcohol use: No    Alcohol/week: 0.0 oz  . Drug use: No    Medications: I have reviewed the patient's current medications. Allergies as of 03/07/2018      Reactions   Neomycin-bacitracin Zn-polymyx    REACTION: "Severe skin rash"   Pravastatin Sodium    REACTION: "Funny feeling and leg pain"   Rosuvastatin Rash      Medication List        Accurate as of 03/07/18 10:51 AM. Always use your most recent med list.  acyclovir 800 MG tablet Commonly known as:  ZOVIRAX Take 800 mg by mouth daily. TAKE 1 TABLET TWICE A DAY   aspirin 81 MG tablet Take 81 mg by mouth daily.   benzonatate 100 MG capsule Commonly known as:  TESSALON Take 1 capsule (100 mg total) by mouth 3 (three) times daily as needed for cough.   cholecalciferol 1000 units tablet Commonly known as:  VITAMIN D Take 1,000 Units by mouth daily.   cloNIDine 0.1 MG tablet Commonly known as:  CATAPRES Take 0.1 mg by mouth 2 (two) times daily.   COMBIGAN 0.2-0.5 % ophthalmic solution Generic drug:  brimonidine-timolol Place 1 drop into both eyes every 12 (twelve) hours.   enalapril 20 MG tablet Commonly known as:  VASOTEC Take 20 mg by mouth daily.   guaiFENesin 600 MG 12 hr tablet Commonly known as:   MUCINEX Take 1 tablet (600 mg total) by mouth 2 (two) times daily.   latanoprost 0.005 % ophthalmic solution Commonly known as:  XALATAN INSTILL 1 DROP IN BOTH EYES NIGHTLY   metFORMIN 500 MG 24 hr tablet Commonly known as:  GLUCOPHAGE-XR Take 1,000 mg by mouth 2 (two) times daily.   multivitamin capsule Take 1 capsule by mouth daily.   oxyCODONE 5 MG immediate release tablet Commonly known as:  Oxy IR/ROXICODONE Take 1 tablet (5 mg total) by mouth every 4 (four) hours as needed for moderate pain.   Polyethyl Glycol-Propyl Glycol 0.4-0.3 % Soln Apply 1 drop to eye daily as needed (dry eyes).   prednisoLONE acetate 1 % ophthalmic suspension Commonly known as:  PRED FORTE 1 drop in the right eye twice a day, 1 drop in the left eye twice a day on Mondays and Fridays.   sulfamethoxazole-trimethoprim 800-160 MG tablet Commonly known as:  BACTRIM DS,SEPTRA DS Take 1 tablet by mouth 2 (two) times daily.        ROS:  A comprehensive review of systems was negative except for: Integument/breast: positive for left breast soreness after biopsy  Blood pressure (!) 155/79, pulse 74, temperature 98.4 F (36.9 C), resp. rate 18, height 5\' 6"  (1.676 m), weight 162 lb (73.5 kg). Physical Exam  Constitutional: She is oriented to person, place, and time and well-developed, well-nourished, and in no distress.  HENT:  Head: Normocephalic.  Eyes: Pupils are equal, round, and reactive to light.  Neck: Normal range of motion. Neck supple.  Cardiovascular: Normal rate and regular rhythm.  Pulmonary/Chest: Effort normal and breath sounds normal.  Right and left breast without masses, discharge, nipple changes, right and left axilla without adenopathy  Abdominal: Soft. She exhibits no distension. There is no tenderness.  Musculoskeletal: Normal range of motion. She exhibits no edema.  Neurological: She is alert and oriented to person, place, and time.  Skin: Skin is warm and dry.  Psychiatric:  Mood, memory, affect and judgment normal.  Vitals reviewed.   Results: 02/02/2018- Mammogram RECOMMENDATION: Diagnostic mammogram and possibly ultrasound of the left breast. (Code:FI-L-40M)  The patient will be contacted regarding the findings, and additional imaging will be scheduled.  BI-RADS CATEGORY  0: Incomplete. Need additional imaging evaluation and/or prior mammograms for comparison.  02/07/18- Mammogram /US guided biopsy  IMPRESSION: Indeterminate solid and cystic left breast mass 9 o'clock position.  RECOMMENDATION: Ultrasound-guided core needle biopsy indeterminate left breast mass 9 o'clock position.  I have discussed the findings and recommendations with the patient. Results were also provided in writing at the conclusion of the visit. If applicable, a reminder  letter will be sent to the patient regarding the next appointment.  BI-RADS CATEGORY  4: Suspicious.  ADDENDUM REPORT: 02/24/2018 17:04  ADDENDUM: Pathology of the left breast biopsy revealed ATYPICAL DUCTAL HYPERPLASIA WITH APOCRINE FEATURES. BENIGN FIBROCYSTIC CHANGE. NEGATIVE FOR PAPILLOMA OR CARCINOMA.  This was found to be concordant by Dr. Radford Pax.  Recommendation: Surgical Brewer.  Results and recommendations were relayed to Candice Brewer, nurse practitioner for Dr. Newt Minion by phone and fax by Su Ley on 02/24/18. She stated that they will get in touch with the patient to give her results and make the Brewer. I spoke to the patient by phone on 02/24/18 for a post biopsy site check. She stated she has done well following the biopsy with no bleeding or pain. She has been notified of her results and is awaiting an appointment with [facility].  Addendum by Candice Brewer, Candice Brewer on 02/24/18.  Pathology: Diagnosis Breast, left, needle core biopsy, 9:00 - ATYPICAL DUCTAL HYPERPLASIA WITH APOCRINE FEATURES - BENIGN FIBROCYSTIC CHANGE - NEGATIVE FOR  PAPILLOMA OR CARCINOMA   Breast Cancer Risk:  5 yrs Risk Patient 2.2% (avg 1.5%) Lifetime Risk Patient 2.7% (avg 1.9%)   Assessment & Plan:  Candice Brewer is a 84 y.o. female with left breast biopsy with atypical ductal hyperplasia that is concordant with the imaging per the documentation above.  Given the risk of upgrading the pathology 10-30% with atypical ductal hyperaplasia, we discussed the risk and benefits of surgical excision after needle localization.    We have discussed that with the surgical excision that we will need to get a needle placed into the area where the biopsy was performed, since we cannot palpate a mass. We have discussed after the final pathology more surgery maybe needed for the axillary lymph nodes and the breast pending the final results.  We have discussed the risk of bleeding, risk of infection, risk of indention due to small breast size and ultimate goal of seroma formation for cosmetics.   All questions were answered to the satisfaction of the patient.  After careful consideration, Candice Brewer has decided to proceed.    Virl Cagey 03/07/2018, 10:51 AM

## 2018-03-08 DIAGNOSIS — N6092 Unspecified benign mammary dysplasia of left breast: Secondary | ICD-10-CM

## 2018-03-08 NOTE — H&P (Signed)
[facility] History and Physical  Reason for Referral: Atypical ductal hyperplasia of left breast  Referring Physician:  Dr. Judee Clara is a 84 y.o. female.  HPI: Ms. Mcaleer is a very pleasant 84 yo who has recently undergone a mammogram with some suspicious findings, and ultimately underwent a diagnostic mammogram and ultrasound with biopsy of a left breast lesion.  She has never had any prior abnormal mammograms. She is otherwise relatively healthy, and is here to discuss surgical excision given the findings of hyperplasia.   The patient has no history of any masses, lumps, bumps, nipple changes or discharge. She had menarche at age 60, and her first pregnancy at age 49. She is G7P5. She has no history of any family breast cancer. She underwent menopause at unknown time.  She has never had any previous biopsies or concerning areas on mammogram.  She has not had any chest radiation.   Past Medical History:  Diagnosis Date  . Arthritis   . Chest pain, unspecified   . Constipation   . DM (diabetes mellitus) ([place])   . Esophageal stricture 09/2011   schatzki's ring  . Fibroids    uterus  . Helicobacter pylori gastritis 2012   Treated  . HTN (hypertension)   . Hyperlipidemia   . Restless legs syndrome (RLS)   . Vitamin D deficiency     Past Surgical History:  Procedure Laterality Date  . APPENDECTOMY    . CARDIOVASCULAR STRESS TEST  2009  . CATARACT EXTRACTION  2010   right eye  . CATARACT EXTRACTION  2012   left eye  . COLONOSCOPY  08/2006   Dr. Vivi Ferns  . COLONOSCOPY  10/13/2011   Normal. Next TCS 09/2016.   Procedure: COLONOSCOPY;  Surgeon: Daneil Dolin, MD;  Location: [facility];  Service: Endoscopy;  Laterality: N/A;  8:15  . COLONOSCOPY N/A 03/10/2015   RMR: External and anal canal hemorrhoids likely source of hematochezia. Redundant colon. Single sigmoid polyp removed ad described above.   . ESOPHAGOGASTRODUODENOSCOPY   06/2008   Dr. Lorrin Mais ring s/p disruption, erosive reflux esophagitis, hh.   . ESOPHAGOGASTRODUODENOSCOPY  09/2011   Schatki's ring s/p dilation, multiple 1-45mm antral and bulbar erosions, bx positive for H.Pylori s/ treatment  . TUBAL LIGATION      Family History  Problem Relation Age of Onset  . Prostate cancer Father   . Colon cancer Father        >age 56  . Cancer Paternal Grandmother        metastatic at time of diagnosis, primary unknown  . Cancer Paternal Aunt        metastatic at time of diagnosisi, primary unknown    Social History   Tobacco Use  . Smoking status: Never Smoker  . Smokeless tobacco: Never Used  . Tobacco comment: quit about 35+ yrs  Substance Use Topics  . Alcohol use: No    Alcohol/week: 0.0 oz  . Drug use: No    Medications: I have reviewed the patient's current medications. Allergies as of 03/07/2018      Reactions   Neomycin-bacitracin Zn-polymyx    REACTION: "Severe skin rash"   Pravastatin Sodium    REACTION: "Funny feeling and leg pain"   Rosuvastatin Rash      Medication List        Accurate as of 03/07/18 10:51 AM. Always use your most recent med list.  acyclovir 800 MG tablet Commonly known as:  ZOVIRAX Take 800 mg by mouth daily. TAKE 1 TABLET TWICE A DAY   aspirin 81 MG tablet Take 81 mg by mouth daily.   benzonatate 100 MG capsule Commonly known as:  TESSALON Take 1 capsule (100 mg total) by mouth 3 (three) times daily as needed for cough.   cholecalciferol 1000 units tablet Commonly known as:  VITAMIN D Take 1,000 Units by mouth daily.   cloNIDine 0.1 MG tablet Commonly known as:  CATAPRES Take 0.1 mg by mouth 2 (two) times daily.   COMBIGAN 0.2-0.5 % ophthalmic solution Generic drug:  brimonidine-timolol Place 1 drop into both eyes every 12 (twelve) hours.   enalapril 20 MG tablet Commonly known as:  VASOTEC Take 20 mg by mouth daily.   guaiFENesin 600 MG 12 hr tablet Commonly known as:   MUCINEX Take 1 tablet (600 mg total) by mouth 2 (two) times daily.   latanoprost 0.005 % ophthalmic solution Commonly known as:  XALATAN INSTILL 1 DROP IN BOTH EYES NIGHTLY   metFORMIN 500 MG 24 hr tablet Commonly known as:  GLUCOPHAGE-XR Take 1,000 mg by mouth 2 (two) times daily.   multivitamin capsule Take 1 capsule by mouth daily.   oxyCODONE 5 MG immediate release tablet Commonly known as:  Oxy IR/ROXICODONE Take 1 tablet (5 mg total) by mouth every 4 (four) hours as needed for moderate pain.   Polyethyl Glycol-Propyl Glycol 0.4-0.3 % Soln Apply 1 drop to eye daily as needed (dry eyes).   prednisoLONE acetate 1 % ophthalmic suspension Commonly known as:  PRED FORTE 1 drop in the right eye twice a day, 1 drop in the left eye twice a day on Mondays and Fridays.   sulfamethoxazole-trimethoprim 800-160 MG tablet Commonly known as:  BACTRIM DS,SEPTRA DS Take 1 tablet by mouth 2 (two) times daily.        ROS:  A comprehensive review of systems was negative except for: Integument/breast: positive for left breast soreness after biopsy  Blood pressure (!) 155/79, pulse 74, temperature 98.4 F (36.9 C), resp. rate 18, height 5\' 6"  (1.676 m), weight 162 lb (73.5 kg). Physical Exam  Constitutional: She is oriented to person, place, and time and well-developed, well-nourished, and in no distress.  HENT:  Head: Normocephalic.  Eyes: Pupils are equal, round, and reactive to light.  Neck: Normal range of motion. Neck supple.  Cardiovascular: Normal rate and regular rhythm.  Pulmonary/Chest: Effort normal and breath sounds normal.  Right and left breast without masses, discharge, nipple changes, right and left axilla without adenopathy  Abdominal: Soft. She exhibits no distension. There is no tenderness.  Musculoskeletal: Normal range of motion. She exhibits no edema.  Neurological: She is alert and oriented to person, place, and time.  Skin: Skin is warm and dry.  Psychiatric:  Mood, memory, affect and judgment normal.  Vitals reviewed.   Results: 02/02/2018- Mammogram RECOMMENDATION: Diagnostic mammogram and possibly ultrasound of the left breast. (Code:FI-L-45M)  The patient will be contacted regarding the findings, and additional imaging will be scheduled.  BI-RADS CATEGORY  0: Incomplete. Need additional imaging evaluation and/or prior mammograms for comparison.  02/07/18- Mammogram /US guided biopsy  IMPRESSION: Indeterminate solid and cystic left breast mass 9 o'clock position.  RECOMMENDATION: Ultrasound-guided core needle biopsy indeterminate left breast mass 9 o'clock position.  I have discussed the findings and recommendations with the patient. Results were also provided in writing at the conclusion of the visit. If applicable, a reminder  letter will be sent to the patient regarding the next appointment.  BI-RADS CATEGORY  4: Suspicious.  ADDENDUM REPORT: 02/24/2018 17:04  ADDENDUM: Pathology of the left breast biopsy revealed ATYPICAL DUCTAL HYPERPLASIA WITH APOCRINE FEATURES. BENIGN FIBROCYSTIC CHANGE. NEGATIVE FOR PAPILLOMA OR CARCINOMA.  This was found to be concordant by Dr. Radford Pax.  Recommendation: Surgical referral.  Results and recommendations were relayed to Ebony Hail, nurse practitioner for Dr. Newt Minion by phone and fax by Su Ley on 02/24/18. She stated that they will get in touch with the patient to give her results and make the referral. I spoke to the patient by phone on 02/24/18 for a post biopsy site check. She stated she has done well following the biopsy with no bleeding or pain. She has been notified of her results and is awaiting an appointment with [facility].  Addendum by Jetta Lout, RRA on 02/24/18.  Pathology: Diagnosis Breast, left, needle core biopsy, 9:00 - ATYPICAL DUCTAL HYPERPLASIA WITH APOCRINE FEATURES - BENIGN FIBROCYSTIC CHANGE - NEGATIVE FOR  PAPILLOMA OR CARCINOMA   Breast Cancer Risk:  5 yrs Risk Patient 2.2% (avg 1.5%) Lifetime Risk Patient 2.7% (avg 1.9%)   Assessment & Plan:  ADA HOLNESS is a 84 y.o. female with left breast biopsy with atypical ductal hyperplasia that is concordant with the imaging per the documentation above.  Given the risk of upgrading the pathology 10-30% with atypical ductal hyperaplasia, we discussed the risk and benefits of surgical excision after needle localization.    We have discussed that with the surgical excision that we will need to get a needle placed into the area where the biopsy was performed, since we cannot palpate a mass. We have discussed after the final pathology more surgery maybe needed for the axillary lymph nodes and the breast pending the final results.  We have discussed the risk of bleeding, risk of infection, risk of indention due to small breast size and ultimate goal of seroma formation for cosmetics.   All questions were answered to the satisfaction of the patient.  After careful consideration, Jennye L Sigal has decided to proceed.    Virl Cagey 03/07/2018, 10:51 AM

## 2018-03-13 NOTE — Patient Instructions (Signed)
Candice Brewer  03/13/2018     @PREFPERIOPPHARMACY @   Your procedure is scheduled on  03/20/2018   Report to [facility] at  730   A.M.  Call this number if you have problems the morning of surgery:  [phone]   Remember:  Do not eat food or drink liquids after midnight.  Take these medicines the morning of surgery with A SIP OF WATER  Enalapril.   Do not wear jewelry, make-up or nail polish.  Do not wear lotions, powders, or perfumes, or deodorant.  Do not shave 48 hours prior to surgery.  Men may shave face and neck.  Do not bring valuables to the hospital.  [facility] is not responsible for any belongings or valuables.  Contacts, dentures or bridgework may not be worn into surgery.  Leave your suitcase in the car.  After surgery it may be brought to your room.  For patients admitted to the hospital, discharge time will be determined by your treatment team.  Patients discharged the day of surgery will not be allowed to drive home.   Name and phone number of your driver:   family Special instructions:  None  Please read over the following fact sheets that you were given. Anesthesia Post-op Instructions and Care and Recovery After Surgery       Breast Biopsy A breast biopsy is a test during which a sample of tissue is taken from your breast. The breast tissue is looked at under a microscope for cancer cells. What happens before the procedure?  Plan to have someone take you home after the test.  Do not use tobacco products. These include cigarettes, chewing tobacco, or e-cigarettes. If you need help quitting, ask your doctor.  Do not drink alcohol for 24 hours before the test.  Ask your doctor about: ? Changing or stopping your normal medicines. This is important if you take diabetes medicines or blood thinners. ? Taking medicines such as aspirin and ibuprofen. These medicines can thin your blood. Do not take these medicines before your procedure if  your doctor tells you not to.  Wear a good support bra to the test.  Ask your doctor how your surgical site will be marked or identified.  You may be given antibiotic medicine to help prevent infection.  You may be checked for extra fluid in your body (lymphedema).  Your doctor may place a wire or a seed in the lump. The wire or seed gives off radiation. This will help your doctor to see the lump during the biopsy. What happens during the procedure? You may be given the following:  A medicine to numb the breast area (local anesthetic).  A medicine to help you relax (sedative).  There are different types of breast biopsies. Each type is described below. Fine-Needle Aspiration  A needle will be put into the breast lump.  The needle will take out fluid and cells from the lump. Core-Needle Biopsy  A needle will be put into the breast lump.  The needle will be put into your breast several times.  The needle will remove breast tissue. Stereotactic Biopsy  You will lie on a table on your belly. Your breast will pass through a hole in the table. Your breast will be held in place.  X-rays and a computer will be used to locate the breast lump.  A needle will be used to remove tissue samples from your breast. Vacuum-Assisted  Biopsy  A small cut (incision) will be made in your breast.  A biopsy device will be put through the cut and into the breast tissue.  The biopsy device will draw abnormal breast tissue into the biopsy device.  A large tissue sample will often be removed.  No stitches will be needed. Ultrasound-Guided Core-Needle Biopsy  Ultrasound imaging will help guide the needle into the area of the breast that is not normal.  A cut will be made in the breast. The needle will be put into the breast lump.  Tissue samples will be taken out. Surgical Biopsy  A cut will be made in the breast to remove tissue.  The cut will be closed with stitches and covered with a  bandage.  There are two types: ? Incisional biopsy. Your doctor will remove part of the breast lump. ? Excisional biopsy. Your doctor will try to remove the whole breast lump or as much as possible. All tissue or fluid samples will be looked at under a microscope. What happens after the procedure?  You will be able to go home when you are doing well and you are not having problems.  You may have bruising on your breast. This is normal.  A pressure bandage (dressing) may be put on your breast. It may be left on for 24-48 hours. This type of bandage is wrapped tightly around your chest. It helps to stop fluid from building up under tissues. You may also need to wear a supportive bra during this time.  Do not drive for 24 hours if you received a sedative. This information is not intended to replace advice given to you by your health care provider. Make sure you discuss any questions you have with your health care provider. Document Released: 02/07/2012 Document Revised: 07/22/2016 Document Reviewed: 08/19/2015 Elsevier Interactive Patient Education  2018 [employer].  Breast Biopsy, Care After These instructions give you information about caring for yourself after your procedure. Your doctor may also give you more specific instructions. Call your doctor if you have any problems or questions after your procedure. Follow these instructions at home: Medicines  Take over-the-counter and prescription medicines only as told by your doctor.  Do not drive for 24 hours if you received a sedative.  Do not drink alcohol while taking pain medicine.  Do not drive or use heavy machinery while taking prescription pain medicine. Biopsy Site Care   Follow instructions from your doctor about how to take care of your cut from surgery (incision) or puncture area. Make sure you: ? Wash your hands with soap and water before you change your bandage. If you cannot use soap and water, use hand  sanitizer. ? Change any bandages (dressings) as told by your doctor. ? Leave any stitches (sutures), skin glue, or skin tape (adhesive) strips in place. They may need to stay in place for 2 weeks or longer. If tape strips get loose and curl up, you may trim the loose edges. Do not remove tape strips completely unless your doctor says it is okay.  If you have stitches, keep them dry when you take a bath or a shower.  Check your cut or puncture area every day for signs of infection. Check for: ? More redness, swelling, or pain. ? More fluid or blood. ? Warmth. ? Pus or a bad smell.  Protect the biopsy area. Do not let the area get bumped. Activity  Avoid activities that could pull the biopsy site open. ? Avoid  stretching. ? Avoid reaching. ? Avoid exercise. ? Avoid sports. ? Avoid lifting anything that is heavier than 3 pounds (1.4 kg).  Return to your normal activities as told by your doctor. Ask your doctor what activities are safe for you. General instructions  Continue your normal diet.  Wear a good support bra for as long as told by your doctor.  Get checked for extra fluid in your body (lymphedema) as often as told by your doctor.  Keep all follow-up visits as told by your doctor. This is important. Contact a health care provider if:  You have more redness, swelling, or pain at the biopsy site.  You have more fluid or blood coming from your biopsy site.  Your biopsy site feels warm to the touch.  You have pus or a bad smell coming from the biopsy site.  Your biopsy site breaks open after the stitches, staples, or skin tape strips have been removed.  You have a rash.  You have a fever. Get help right away if:  You have more bleeding (more than a small spot) from the biopsy site.  You have trouble breathing.  You have red streaks around the biopsy site. This information is not intended to replace advice given to you by your health care provider. Make sure you  discuss any questions you have with your health care provider. Document Released: 09/11/2009 Document Revised: 07/22/2016 Document Reviewed: 08/19/2015 Elsevier Interactive Patient Education  2018 [place] Anesthesia is a term that refers to techniques, procedures, and medicines that help a person stay safe and comfortable during a medical procedure. Monitored anesthesia care, or sedation, is one type of anesthesia. Your anesthesia specialist may recommend sedation if you will be having a procedure that does not require you to be unconscious, such as:  Cataract surgery.  A dental procedure.  A biopsy.  A colonoscopy.  During the procedure, you may receive a medicine to help you relax (sedative). There are three levels of sedation:  Mild sedation. At this level, you may feel awake and relaxed. You will be able to follow directions.  Moderate sedation. At this level, you will be sleepy. You may not remember the procedure.  Deep sedation. At this level, you will be asleep. You will not remember the procedure.  The more medicine you are given, the deeper your level of sedation will be. Depending on how you respond to the procedure, the anesthesia specialist may change your level of sedation or the type of anesthesia to fit your needs. An anesthesia specialist will monitor you closely during the procedure. Let your health care provider know about:  Any allergies you have.  All medicines you are taking, including vitamins, herbs, eye drops, creams, and over-the-counter medicines.  Any use of steroids (by mouth or as a cream).  Any problems you or family members have had with sedatives and anesthetic medicines.  Any blood disorders you have.  Any surgeries you have had.  Any medical conditions you have, such as sleep apnea.  Whether you are pregnant or may be pregnant.  Any use of cigarettes, alcohol, or street drugs. What are the  risks? Generally, this is a safe procedure. However, problems may occur, including:  Getting too much medicine (oversedation).  Nausea.  Allergic reaction to medicines.  Trouble breathing. If this happens, a breathing tube may be used to help with breathing. It will be removed when you are awake and breathing on your own.  Heart trouble.  Lung trouble.  Before the procedure Staying hydrated Follow instructions from your health care provider about hydration, which may include:  Up to 2 hours before the procedure - you may continue to drink clear liquids, such as water, clear fruit juice, black coffee, and plain tea.  Eating and drinking restrictions Follow instructions from your health care provider about eating and drinking, which may include:  8 hours before the procedure - stop eating heavy meals or foods such as meat, fried foods, or fatty foods.  6 hours before the procedure - stop eating light meals or foods, such as toast or cereal.  6 hours before the procedure - stop drinking milk or drinks that contain milk.  2 hours before the procedure - stop drinking clear liquids.  Medicines Ask your health care provider about:  Changing or stopping your regular medicines. This is especially important if you are taking diabetes medicines or blood thinners.  Taking medicines such as aspirin and ibuprofen. These medicines can thin your blood. Do not take these medicines before your procedure if your health care provider instructs you not to.  Tests and exams  You will have a physical exam.  You may have blood tests done to show: ? How well your kidneys and liver are working. ? How well your blood can clot.  General instructions  Plan to have someone take you home from the hospital or clinic.  If you will be going home right after the procedure, plan to have someone with you for 24 hours.  What happens during the procedure?  Your blood pressure, heart rate, breathing,  level of pain and overall condition will be monitored.  An IV tube will be inserted into one of your veins.  Your anesthesia specialist will give you medicines as needed to keep you comfortable during the procedure. This may mean changing the level of sedation.  The procedure will be performed. After the procedure  Your blood pressure, heart rate, breathing rate, and blood oxygen level will be monitored until the medicines you were given have worn off.  Do not drive for 24 hours if you received a sedative.  You may: ? Feel sleepy, clumsy, or nauseous. ? Feel forgetful about what happened after the procedure. ? Have a sore throat if you had a breathing tube during the procedure. ? Vomit. This information is not intended to replace advice given to you by your health care provider. Make sure you discuss any questions you have with your health care provider. Document Released: 08/11/2005 Document Revised: 04/23/2016 Document Reviewed: 03/07/2016 Elsevier Interactive Patient Education  2018 [place], Care After These instructions provide you with information about caring for yourself after your procedure. Your health care provider may also give you more specific instructions. Your treatment has been planned according to current medical practices, but problems sometimes occur. Call your health care provider if you have any problems or questions after your procedure. What can I expect after the procedure? After your procedure, it is common to:  Feel sleepy for several hours.  Feel clumsy and have poor balance for several hours.  Feel forgetful about what happened after the procedure.  Have poor judgment for several hours.  Feel nauseous or vomit.  Have a sore throat if you had a breathing tube during the procedure.  Follow these instructions at home: For at least 24 hours after the procedure:   Do not: ? Participate in activities in which you could  fall or become  injured. ? Drive. ? Use heavy machinery. ? Drink alcohol. ? Take sleeping pills or medicines that cause drowsiness. ? Make important decisions or sign legal documents. ? Take care of children on your own.  Rest. Eating and drinking  Follow the diet that is recommended by your health care provider.  If you vomit, drink water, juice, or soup when you can drink without vomiting.  Make sure you have little or no nausea before eating solid foods. General instructions  Have a responsible adult stay with you until you are awake and alert.  Take over-the-counter and prescription medicines only as told by your health care provider.  If you smoke, do not smoke without supervision.  Keep all follow-up visits as told by your health care provider. This is important. Contact a health care provider if:  You keep feeling nauseous or you keep vomiting.  You feel light-headed.  You develop a rash.  You have a fever. Get help right away if:  You have trouble breathing. This information is not intended to replace advice given to you by your health care provider. Make sure you discuss any questions you have with your health care provider. Document Released: 03/07/2016 Document Revised: 07/07/2016 Document Reviewed: 03/07/2016 Elsevier Interactive Patient Education  [employer].

## 2018-03-14 ENCOUNTER — Other Ambulatory Visit: Payer: Self-pay

## 2018-03-14 ENCOUNTER — Encounter (HOSPITAL_COMMUNITY): Payer: Self-pay

## 2018-03-14 ENCOUNTER — Encounter (HOSPITAL_COMMUNITY)
Admission: RE | Admit: 2018-03-14 | Discharge: 2018-03-14 | Disposition: A | Discharge status: Home / Self Care | Payer: Medicare Other | Source: Ambulatory Visit | Attending: General Surgery | Admitting: General Surgery

## 2018-03-14 DIAGNOSIS — Z01818 Encounter for other preprocedural examination: Secondary | ICD-10-CM | POA: Insufficient documentation

## 2018-03-14 DIAGNOSIS — E559 Vitamin D deficiency, unspecified: Secondary | ICD-10-CM | POA: Diagnosis not present

## 2018-03-14 DIAGNOSIS — I10 Essential (primary) hypertension: Secondary | ICD-10-CM | POA: Insufficient documentation

## 2018-03-14 DIAGNOSIS — R9431 Abnormal electrocardiogram [ECG] [EKG]: Secondary | ICD-10-CM | POA: Diagnosis not present

## 2018-03-14 DIAGNOSIS — E785 Hyperlipidemia, unspecified: Secondary | ICD-10-CM | POA: Diagnosis not present

## 2018-03-14 DIAGNOSIS — I517 Cardiomegaly: Secondary | ICD-10-CM | POA: Diagnosis not present

## 2018-03-14 DIAGNOSIS — Z87891 Personal history of nicotine dependence: Secondary | ICD-10-CM | POA: Diagnosis not present

## 2018-03-14 DIAGNOSIS — E119 Type 2 diabetes mellitus without complications: Secondary | ICD-10-CM | POA: Insufficient documentation

## 2018-03-14 DIAGNOSIS — M1990 Unspecified osteoarthritis, unspecified site: Secondary | ICD-10-CM | POA: Insufficient documentation

## 2018-03-14 DIAGNOSIS — Z7982 Long term (current) use of aspirin: Secondary | ICD-10-CM | POA: Diagnosis not present

## 2018-03-14 DIAGNOSIS — N6092 Unspecified benign mammary dysplasia of left breast: Secondary | ICD-10-CM | POA: Insufficient documentation

## 2018-03-14 DIAGNOSIS — G2581 Restless legs syndrome: Secondary | ICD-10-CM | POA: Insufficient documentation

## 2018-03-14 DIAGNOSIS — Z79899 Other long term (current) drug therapy: Secondary | ICD-10-CM | POA: Diagnosis not present

## 2018-03-14 DIAGNOSIS — Z01812 Encounter for preprocedural laboratory examination: Secondary | ICD-10-CM | POA: Diagnosis not present

## 2018-03-14 HISTORY — DX: Unspecified glaucoma: H40.9

## 2018-03-14 HISTORY — DX: Dry eye syndrome of bilateral lacrimal glands: H04.123

## 2018-03-14 LAB — CBC WITH DIFFERENTIAL/PLATELET
Basophils Absolute: 0 10*3/uL (ref 0.0–0.1)
Basophils Relative: 0 %
EOS ABS: 0.2 10*3/uL (ref 0.0–0.7)
Eosinophils Relative: 2 %
HCT: 37.6 % (ref 36.0–46.0)
HEMOGLOBIN: 12.3 g/dL (ref 12.0–15.0)
LYMPHS ABS: 2.9 10*3/uL (ref 0.7–4.0)
LYMPHS PCT: 34 %
MCH: 31.7 pg (ref 26.0–34.0)
MCHC: 32.7 g/dL (ref 30.0–36.0)
MCV: 96.9 fL (ref 78.0–100.0)
Monocytes Absolute: 0.5 10*3/uL (ref 0.1–1.0)
Monocytes Relative: 6 %
NEUTROS PCT: 58 %
Neutro Abs: 5 10*3/uL (ref 1.7–7.7)
Platelets: 261 10*3/uL (ref 150–400)
RBC: 3.88 MIL/uL (ref 3.87–5.11)
RDW: 13.3 % (ref 11.5–15.5)
WBC: 8.6 10*3/uL (ref 4.0–10.5)

## 2018-03-14 LAB — HEMOGLOBIN A1C
HEMOGLOBIN A1C: 6.9 % — AB (ref 4.8–5.6)
Mean Plasma Glucose: 151.33 mg/dL

## 2018-03-14 LAB — BASIC METABOLIC PANEL
ANION GAP: 10 (ref 5–15)
BUN: 21 mg/dL — AB (ref 6–20)
CHLORIDE: 101 mmol/L (ref 101–111)
CO2: 26 mmol/L (ref 22–32)
Calcium: 10.1 mg/dL (ref 8.9–10.3)
Creatinine, Ser: 1.13 mg/dL — ABNORMAL HIGH (ref 0.44–1.00)
GFR calc Af Amer: 51 mL/min — ABNORMAL LOW (ref >60)
GFR calc non Af Amer: 44 mL/min — ABNORMAL LOW (ref >60)
Glucose, Bld: 140 mg/dL — ABNORMAL HIGH (ref 65–99)
POTASSIUM: 4.4 mmol/L (ref 3.5–5.1)
SODIUM: 137 mmol/L (ref 135–145)

## 2018-03-14 LAB — GLUCOSE, CAPILLARY: Glucose-Capillary: 151 mg/dL — ABNORMAL HIGH (ref 65–99)

## 2018-03-15 NOTE — Pre-Procedure Instructions (Signed)
HgbA1C routed to PCP. 

## 2018-03-20 ENCOUNTER — Ambulatory Visit (HOSPITAL_COMMUNITY): Payer: Medicare Other | Admitting: Anesthesiology

## 2018-03-20 ENCOUNTER — Encounter (HOSPITAL_COMMUNITY)
Admission: RE | Disposition: A | Discharge status: Home / Self Care | Payer: Self-pay | Source: Ambulatory Visit | Attending: General Surgery

## 2018-03-20 ENCOUNTER — Other Ambulatory Visit: Payer: Self-pay

## 2018-03-20 ENCOUNTER — Ambulatory Visit (HOSPITAL_COMMUNITY)
Admission: RE | Admit: 2018-03-20 | Discharge: 2018-03-20 | Disposition: A | Discharge status: Home / Self Care | Payer: Medicare Other | Source: Ambulatory Visit | Attending: General Surgery | Admitting: General Surgery

## 2018-03-20 ENCOUNTER — Other Ambulatory Visit: Payer: Self-pay | Admitting: General Surgery

## 2018-03-20 ENCOUNTER — Encounter (HOSPITAL_COMMUNITY): Payer: Self-pay | Admitting: Anesthesiology

## 2018-03-20 ENCOUNTER — Encounter (HOSPITAL_COMMUNITY): Payer: Self-pay | Admitting: *Deleted

## 2018-03-20 ENCOUNTER — Encounter (HOSPITAL_COMMUNITY): Payer: Self-pay

## 2018-03-20 DIAGNOSIS — R928 Other abnormal and inconclusive findings on diagnostic imaging of breast: Secondary | ICD-10-CM

## 2018-03-20 DIAGNOSIS — N6092 Unspecified benign mammary dysplasia of left breast: Secondary | ICD-10-CM

## 2018-03-20 DIAGNOSIS — G2581 Restless legs syndrome: Secondary | ICD-10-CM | POA: Diagnosis not present

## 2018-03-20 DIAGNOSIS — K5900 Constipation, unspecified: Secondary | ICD-10-CM | POA: Insufficient documentation

## 2018-03-20 DIAGNOSIS — N6099 Unspecified benign mammary dysplasia of unspecified breast: Secondary | ICD-10-CM

## 2018-03-20 DIAGNOSIS — Z79899 Other long term (current) drug therapy: Secondary | ICD-10-CM | POA: Insufficient documentation

## 2018-03-20 DIAGNOSIS — Z888 Allergy status to other drugs, medicaments and biological substances status: Secondary | ICD-10-CM | POA: Diagnosis not present

## 2018-03-20 DIAGNOSIS — E559 Vitamin D deficiency, unspecified: Secondary | ICD-10-CM | POA: Insufficient documentation

## 2018-03-20 DIAGNOSIS — Z8042 Family history of malignant neoplasm of prostate: Secondary | ICD-10-CM | POA: Insufficient documentation

## 2018-03-20 DIAGNOSIS — Z9842 Cataract extraction status, left eye: Secondary | ICD-10-CM | POA: Insufficient documentation

## 2018-03-20 DIAGNOSIS — Z961 Presence of intraocular lens: Secondary | ICD-10-CM | POA: Diagnosis not present

## 2018-03-20 DIAGNOSIS — Z87891 Personal history of nicotine dependence: Secondary | ICD-10-CM | POA: Diagnosis not present

## 2018-03-20 DIAGNOSIS — Z7982 Long term (current) use of aspirin: Secondary | ICD-10-CM | POA: Insufficient documentation

## 2018-03-20 DIAGNOSIS — Z881 Allergy status to other antibiotic agents status: Secondary | ICD-10-CM | POA: Diagnosis not present

## 2018-03-20 DIAGNOSIS — Z8719 Personal history of other diseases of the digestive system: Secondary | ICD-10-CM | POA: Insufficient documentation

## 2018-03-20 DIAGNOSIS — Z9841 Cataract extraction status, right eye: Secondary | ICD-10-CM | POA: Insufficient documentation

## 2018-03-20 DIAGNOSIS — Z8619 Personal history of other infectious and parasitic diseases: Secondary | ICD-10-CM | POA: Diagnosis not present

## 2018-03-20 DIAGNOSIS — I10 Essential (primary) hypertension: Secondary | ICD-10-CM | POA: Insufficient documentation

## 2018-03-20 DIAGNOSIS — M1990 Unspecified osteoarthritis, unspecified site: Secondary | ICD-10-CM | POA: Insufficient documentation

## 2018-03-20 DIAGNOSIS — N6012 Diffuse cystic mastopathy of left breast: Secondary | ICD-10-CM | POA: Insufficient documentation

## 2018-03-20 DIAGNOSIS — E119 Type 2 diabetes mellitus without complications: Secondary | ICD-10-CM | POA: Diagnosis not present

## 2018-03-20 DIAGNOSIS — Z7984 Long term (current) use of oral hypoglycemic drugs: Secondary | ICD-10-CM | POA: Diagnosis not present

## 2018-03-20 DIAGNOSIS — E785 Hyperlipidemia, unspecified: Secondary | ICD-10-CM | POA: Diagnosis not present

## 2018-03-20 DIAGNOSIS — Z809 Family history of malignant neoplasm, unspecified: Secondary | ICD-10-CM | POA: Insufficient documentation

## 2018-03-20 HISTORY — PX: BREAST BIOPSY: SHX20

## 2018-03-20 LAB — GLUCOSE, CAPILLARY
Glucose-Capillary: 161 mg/dL — ABNORMAL HIGH (ref 65–99)
Glucose-Capillary: 142 mg/dL — ABNORMAL HIGH (ref 65–99)

## 2018-03-20 SURGERY — BREAST BIOPSY WITH NEEDLE LOCALIZATION
Anesthesia: General | Site: Breast | Laterality: Left

## 2018-03-20 MED ORDER — BUPIVACAINE HCL (PF) 0.5 % IJ SOLN
INTRAMUSCULAR | Status: DC | PRN
  Administered 2018-03-20: 10 mL

## 2018-03-20 MED ORDER — KETOROLAC TROMETHAMINE 30 MG/ML IJ SOLN: 30.0000 mg | Freq: Once | INTRAMUSCULAR | Status: DC | PRN

## 2018-03-20 MED ORDER — HYDROMORPHONE HCL 1 MG/ML IJ SOLN: 0.2500 mg | INTRAMUSCULAR | Status: DC | PRN

## 2018-03-20 MED ORDER — PROPOFOL 10 MG/ML IV BOLUS
INTRAVENOUS | Status: DC | PRN
  Administered 2018-03-20: 100 mg via INTRAVENOUS
  Administered 2018-03-20: 30 mg via INTRAVENOUS
  Administered 2018-03-20: 20 mg via INTRAVENOUS

## 2018-03-20 MED ORDER — POVIDONE-IODINE 10 % EX SOLN
CUTANEOUS | Status: AC
  Filled 2018-03-20: qty 15

## 2018-03-20 MED ORDER — MEPERIDINE HCL 50 MG/ML IJ SOLN: 6.2500 mg | INTRAMUSCULAR | Status: DC | PRN

## 2018-03-20 MED ORDER — CEFAZOLIN SODIUM-DEXTROSE 2-4 GM/100ML-% IV SOLN
INTRAVENOUS | Status: AC
  Filled 2018-03-20: qty 100

## 2018-03-20 MED ORDER — HYDROCODONE-ACETAMINOPHEN 7.5-325 MG PO TABS: 1.0000 | ORAL_TABLET | Freq: Once | ORAL | Status: DC | PRN

## 2018-03-20 MED ORDER — FENTANYL CITRATE (PF) 100 MCG/2ML IJ SOLN
INTRAMUSCULAR | Status: DC | PRN
  Administered 2018-03-20 (×2): 25 ug via INTRAVENOUS

## 2018-03-20 MED ORDER — CHLORHEXIDINE GLUCONATE CLOTH 2 % EX PADS: 6.0000 | MEDICATED_PAD | Freq: Once | CUTANEOUS | Status: DC

## 2018-03-20 MED ORDER — BUPIVACAINE HCL (PF) 0.5 % IJ SOLN
INTRAMUSCULAR | Status: AC
  Filled 2018-03-20: qty 30

## 2018-03-20 MED ORDER — DOCUSATE SODIUM 100 MG PO CAPS: 100.0000 mg | ORAL_CAPSULE | Freq: Two times a day (BID) | ORAL | 2 refills | Status: AC

## 2018-03-20 MED ORDER — ONDANSETRON HCL 4 MG/2ML IJ SOLN: 4.0000 mg | Freq: Once | INTRAMUSCULAR | Status: DC | PRN

## 2018-03-20 MED ORDER — OXYCODONE HCL 5 MG PO TABS: 5.0000 mg | ORAL_TABLET | ORAL | 0 refills | Status: AC | PRN

## 2018-03-20 MED ORDER — LACTATED RINGERS IV SOLN
INTRAVENOUS | Status: DC
  Administered 2018-03-20: 09:00:00 via INTRAVENOUS

## 2018-03-20 MED ORDER — LACTATED RINGERS IV SOLN: INTRAVENOUS | Status: DC

## 2018-03-20 MED ORDER — LIDOCAINE HCL (PF) 2 % IJ SOLN
INTRAMUSCULAR | Status: AC
  Filled 2018-03-20: qty 10

## 2018-03-20 MED ORDER — ONDANSETRON HCL 4 MG/2ML IJ SOLN
INTRAMUSCULAR | Status: DC | PRN
  Administered 2018-03-20: 4 mg via INTRAVENOUS

## 2018-03-20 MED ORDER — CEFAZOLIN SODIUM-DEXTROSE 2-4 GM/100ML-% IV SOLN
2.0000 g | INTRAVENOUS | Status: AC
  Administered 2018-03-20: 2 g via INTRAVENOUS

## 2018-03-20 MED ORDER — FENTANYL CITRATE (PF) 100 MCG/2ML IJ SOLN
INTRAMUSCULAR | Status: AC
  Filled 2018-03-20: qty 2

## 2018-03-20 SURGICAL SUPPLY — 32 items
BAG HAMPER (MISCELLANEOUS) ×3 IMPLANT
BLADE SURG 15 STRL LF DISP TIS (BLADE) ×1 IMPLANT
BLADE SURG 15 STRL SS (BLADE) ×2
CHLORAPREP W/TINT 26ML (MISCELLANEOUS) ×3 IMPLANT
CLOTH BEACON ORANGE TIMEOUT ST (SAFETY) ×3 IMPLANT
COVER LIGHT HANDLE STERIS (MISCELLANEOUS) ×6 IMPLANT
DECANTER SPIKE VIAL GLASS SM (MISCELLANEOUS) ×3 IMPLANT
DERMABOND ADVANCED (GAUZE/BANDAGES/DRESSINGS) ×2
DERMABOND ADVANCED .7 DNX12 (GAUZE/BANDAGES/DRESSINGS) ×1 IMPLANT
ELECT REM PT RETURN 9FT ADLT (ELECTROSURGICAL) ×3
ELECTRODE REM PT RTRN 9FT ADLT (ELECTROSURGICAL) ×1 IMPLANT
FORMALIN 10 PREFIL 120ML (MISCELLANEOUS) ×3 IMPLANT
GLOVE BIO SURGEON STRL SZ 6.5 (GLOVE) ×4 IMPLANT
GLOVE BIO SURGEONS STRL SZ 6.5 (GLOVE) ×2
GLOVE BIOGEL PI IND STRL 6.5 (GLOVE) ×1 IMPLANT
GLOVE BIOGEL PI IND STRL 7.0 (GLOVE) ×1 IMPLANT
GLOVE BIOGEL PI INDICATOR 6.5 (GLOVE) ×2
GLOVE BIOGEL PI INDICATOR 7.0 (GLOVE) ×2
GOWN STRL REUS W/TWL LRG LVL3 (GOWN DISPOSABLE) ×9 IMPLANT
KIT TURNOVER KIT A (KITS) ×3 IMPLANT
MANIFOLD NEPTUNE II (INSTRUMENTS) ×3 IMPLANT
NEEDLE HYPO 25X1 1.5 SAFETY (NEEDLE) ×3 IMPLANT
NS IRRIG 1000ML POUR BTL (IV SOLUTION) ×3 IMPLANT
PACK MINOR (CUSTOM PROCEDURE TRAY) ×3 IMPLANT
PAD ARMBOARD 7.5X6 YLW CONV (MISCELLANEOUS) ×3 IMPLANT
SET BASIN LINEN APH (SET/KITS/TRAYS/PACK) ×3 IMPLANT
SUT SILK 0 FSL (SUTURE) ×3 IMPLANT
SUT SILK 2 0 SH (SUTURE) ×3 IMPLANT
SUT VIC AB 3-0 SH 27 (SUTURE) ×3
SUT VIC AB 3-0 SH 27X BRD (SUTURE) ×1 IMPLANT
SUT VIC AB 4-0 PS2 27 (SUTURE) ×3 IMPLANT
SYR CONTROL 10ML LL (SYRINGE) ×3 IMPLANT

## 2018-03-20 NOTE — Op Note (Signed)
[facility] Operative Note  03/20/18  Preoperative Diagnosis:  Left Breast Atypical ductal hyperplasia    Postoperative Diagnosis: Same   Procedure(s) Performed:  Excisional breast biopsy after needle localization    Surgeon: Lanell Matar. Constance Haw, MD   Assistants: No qualified resident was available   Anesthesia: General endotracheal   Anesthesiologist: Dr. Chesley Mires    Specimens: Left breast biopsy    Estimated Blood Loss: Minimal   Blood Replacement: None    Complications: None   Wound Class: Clean    Operative Indications: Candice Brewer is a healthy 84 yo who underwent a mammogram and biopsy demonstrating atypical ductal hyperplasia of the left breast. She otherwise has been well, and has never had any masses in her breast or prior abnormal mammograms or biopsies.  Given the low risk of this area harboring a cancer, we discussed the risk and benefits of excisional biopsy after needle localization to get a definitive answer.  After careful consideration, she opted to proceed, knowing that if a cancer is found this would mean more surgery.   Findings:Dense breast tissue    Procedure: Prior to the OR, the patient went to radiology for needle localization of her prior biopsy site.  Her films were reviewed demonstrating a fairly superficial lesion on the inferior inner breast.  The patient was taken to the operating room and placed supine. General endotracheal anesthesia was induced. Intravenous antibiotics were administered per protocol.  The left breast was prepped and draped in the normal sterile fashion.   A circumareolar incision was made on the medial side of the breast, and the wire was brought into the field. Skin flaps were created. The representative tissue around the wire was removed intact using electrocautery.  The specimen was marked short superior, long lateral, and sent to radiology to confirm clip placement.    Hemostasis of the cavity was achieved.  The deep dermal layer was closed with 3-0 running Vicryl suture, and the skin was closed with a running 4-0 Monocryl and Dermabond. The biopsy clip was confirmed to be in the specimen.    All counts were correct at the end of the case. The patient was awakened from anesthesia and extubate without complication.  The patient went to the PACU in stable condition.   Candice Labrum, MD [facility] [address] [place], [place] [address] [phone] (office)

## 2018-03-20 NOTE — Anesthesia Postprocedure Evaluation (Signed)
Anesthesia Post Note  Patient: Candice Brewer  Procedure(s) Performed: BREAST BIOPSY WITH NEEDLE LOCALIZATION (Left Breast)  Patient location during evaluation: PACU Anesthesia Type: General Level of consciousness: awake and alert and patient cooperative Pain management: satisfactory to patient Vital Signs Assessment: post-procedure vital signs reviewed and stable Respiratory status: spontaneous breathing Cardiovascular status: stable Postop Assessment: no apparent nausea or vomiting Anesthetic complications: no     Last Vitals:  Vitals:   03/20/18 1100 03/20/18 1106  BP: (!) 176/81 (!) 172/92  Pulse: 67 71  Resp: 14 18  Temp:  36.6 C  SpO2: 98% 98%    Last Pain:  Vitals:   03/20/18 1106  TempSrc: Oral  PainSc: 0-No pain                 Kasmira Cacioppo

## 2018-03-20 NOTE — Discharge Instructions (Signed)
Discharge Instructions: Shower per your regular routine. Take tylenol and ibuprofen as needed for pain control, alternating every 4-6 hours.  Take Roxicodone for breakthrough pain. Take colace for constipation related to narcotic pain medication. Do not pick at the dermabond glue on your incision sites.     Breast Biopsy, Care After These instructions give you information about caring for yourself after your procedure. Your doctor may also give you more specific instructions. Call your doctor if you have any problems or questions after your procedure. Follow these instructions at home: Medicines  Take over-the-counter and prescription medicines only as told by your doctor.  Do not drive for 24 hours if you received a sedative.  Do not drink alcohol while taking pain medicine.  Do not drive or use heavy machinery while taking prescription pain medicine. Biopsy Site Care   Follow instructions from your doctor about how to take care of your cut from surgery (incision) or puncture area. Make sure you: ? Wash your hands with soap and water before you change your bandage. If you cannot use soap and water, use hand sanitizer. ? Change any bandages (dressings) as told by your doctor. ? Leave any stitches (sutures), skin glue, or skin tape (adhesive) strips in place. They may need to stay in place for 2 weeks or longer. If tape strips get loose and curl up, you may trim the loose edges. Do not remove tape strips completely unless your doctor says it is okay.  If you have stitches, keep them dry when you take a bath or a shower.  Check your cut or puncture area every day for signs of infection. Check for: ? More redness, swelling, or pain. ? More fluid or blood. ? Warmth. ? Pus or a bad smell.  Protect the biopsy area. Do not let the area get bumped. Activity  Avoid activities that could pull the biopsy site open. ? Avoid stretching. ? Avoid reaching. ? Avoid exercise. ? Avoid  sports. ? Avoid lifting anything that is heavier than 3 pounds (1.4 kg).  Return to your normal activities as told by your doctor. Ask your doctor what activities are safe for you. General instructions  Continue your normal diet.  Wear a good support bra for as long as told by your doctor.  Get checked for extra fluid in your body (lymphedema) as often as told by your doctor.  Keep all follow-up visits as told by your doctor. This is important. Contact a health care provider if:  You have more redness, swelling, or pain at the biopsy site.  You have more fluid or blood coming from your biopsy site.  Your biopsy site feels warm to the touch.  You have pus or a bad smell coming from the biopsy site.  Your biopsy site breaks open after the stitches, staples, or skin tape strips have been removed.  You have a rash.  You have a fever. Get help right away if:  You have more bleeding (more than a small spot) from the biopsy site.  You have trouble breathing.  You have red streaks around the biopsy site. This information is not intended to replace advice given to you by your health care provider. Make sure you discuss any questions you have with your health care provider. Document Released: 09/11/2009 Document Revised: 07/22/2016 Document Reviewed: 08/19/2015 Elsevier Interactive Patient Education  2018 [employer] Anesthesia, Adult, Care After These instructions provide you with information about caring for yourself after your  procedure. Your health care provider may also give you more specific instructions. Your treatment has been planned according to current medical practices, but problems sometimes occur. Call your health care provider if you have any problems or questions after your procedure. What can I expect after the procedure? After the procedure, it is common to have:  Vomiting.  A sore throat.  Mental slowness.  It is common to  feel:  Nauseous.  Cold or shivery.  Sleepy.  Tired.  Sore or achy, even in parts of your body where you did not have surgery.  Follow these instructions at home: For at least 24 hours after the procedure:  Do not: ? Participate in activities where you could fall or become injured. ? Drive. ? Use heavy machinery. ? Drink alcohol. ? Take sleeping pills or medicines that cause drowsiness. ? Make important decisions or sign legal documents. ? Take care of children on your own.  Rest. Eating and drinking  If you vomit, drink water, juice, or soup when you can drink without vomiting.  Drink enough fluid to keep your urine clear or pale yellow.  Make sure you have little or no nausea before eating solid foods.  Follow the diet recommended by your health care provider. General instructions  Have a responsible adult stay with you until you are awake and alert.  Return to your normal activities as told by your health care provider. Ask your health care provider what activities are safe for you.  Take over-the-counter and prescription medicines only as told by your health care provider.  If you smoke, do not smoke without supervision.  Keep all follow-up visits as told by your health care provider. This is important. Contact a health care provider if:  You continue to have nausea or vomiting at home, and medicines are not helpful.  You cannot drink fluids or start eating again.  You cannot urinate after 8-12 hours.  You develop a skin rash.  You have fever.  You have increasing redness at the site of your procedure. Get help right away if:  You have difficulty breathing.  You have chest pain.  You have unexpected bleeding.  You feel that you are having a life-threatening or urgent problem. This information is not intended to replace advice given to you by your health care provider. Make sure you discuss any questions you have with your health care  provider. Document Released: 02/21/2001 Document Revised: 04/19/2016 Document Reviewed: 10/30/2015 Elsevier Interactive Patient Education  [employer].

## 2018-03-20 NOTE — Anesthesia Preprocedure Evaluation (Signed)
Anesthesia Evaluation  Patient identified by MRN, date of birth, ID band Patient awake    Reviewed: Allergy & Precautions, H&P , NPO status , Patient's Chart, lab work & pertinent test results, reviewed documented beta blocker date and time   Airway Mallampati: II  TM Distance: >3 FB Neck ROM: full    Dental no notable dental hx. (+) Partial Upper, Partial Lower   Pulmonary neg pulmonary ROS, pneumonia, resolved,    Pulmonary exam normal breath sounds clear to auscultation       Cardiovascular Exercise Tolerance: Good hypertension, negative cardio ROS   Rhythm:regular Rate:Normal     Neuro/Psych negative neurological ROS  negative psych ROS   GI/Hepatic negative GI ROS, Neg liver ROS,   Endo/Other  negative endocrine ROSdiabetes  Renal/GU negative Renal ROS  negative genitourinary   Musculoskeletal   Abdominal   Peds  Hematology negative hematology ROS (+)   Anesthesia Other Findings   Reproductive/Obstetrics negative OB ROS                             Anesthesia Physical Anesthesia Plan  ASA: III  Anesthesia Plan: General   Post-op Pain Management:    Induction:   PONV Risk Score and Plan: Ondansetron  Airway Management Planned:   Additional Equipment:   Intra-op Plan:   Post-operative Plan:   Informed Consent: I have reviewed the patients History and Physical, chart, labs and discussed the procedure including the risks, benefits and alternatives for the proposed anesthesia with the patient or authorized representative who has indicated his/her understanding and acceptance.   Dental Advisory Given  Plan Discussed with: CRNA  Anesthesia Plan Comments:         Anesthesia Quick Evaluation

## 2018-03-20 NOTE — Transfer of Care (Signed)
Immediate Anesthesia Transfer of Care Note  Patient: Candice Brewer  Procedure(s) Performed: BREAST BIOPSY WITH NEEDLE LOCALIZATION (Left Breast)  Patient Location: PACU  Anesthesia Type:General  Level of Consciousness: awake and patient cooperative  Airway & Oxygen Therapy: Patient Spontanous Breathing and non-rebreather face mask  Post-op Assessment: Report given to RN and Post -op Vital signs reviewed and stable  Post vital signs: Reviewed and stable  Last Vitals:  Vitals Value Taken Time  BP 164/78 03/20/2018 10:22 AM  Temp    Pulse 67 03/20/2018 10:23 AM  Resp 14 03/20/2018 10:23 AM  SpO2 100 % 03/20/2018 10:23 AM  Vitals shown include unvalidated device data.  Last Pain:  Vitals:   03/20/18 0741  TempSrc: Oral  PainSc: 0-No pain         Complications: No apparent anesthesia complications

## 2018-03-20 NOTE — Interval H&P Note (Signed)
History and Physical Interval Note:  03/20/2018 9:04 AM  Candice Brewer  has presented today for surgery, with the diagnosis of atypical hyperplasia of left breast at 9 o'clock position  The various methods of treatment have been discussed with the patient and family. After consideration of risks, benefits and other options for treatment, the patient has consented to  Procedure(s): BREAST BIOPSY WITH NEEDLE LOCALIZATION (Left) as a surgical intervention .  The patient's history has been reviewed, patient examined, no change in status, stable for surgery.  I have reviewed the patient's chart and labs.  Questions were answered to the patient's satisfaction.    No major changes. Needle localization done. Marked.   Virl Cagey

## 2018-03-20 NOTE — Anesthesia Procedure Notes (Signed)
Procedure Name: LMA Insertion Date/Time: 03/20/2018 9:32 AM Performed by: Vista Deck, CRNA Pre-anesthesia Checklist: Patient identified, Patient being monitored, Emergency Drugs available, Timeout performed and Suction available Patient Re-evaluated:Patient Re-evaluated prior to induction Oxygen Delivery Method: Circle System Utilized Preoxygenation: Pre-oxygenation with 100% oxygen Induction Type: IV induction Ventilation: Mask ventilation without difficulty LMA: LMA inserted LMA Size: 4.0 Number of attempts: 1 Placement Confirmation: positive ETCO2 and breath sounds checked- equal and bilateral Tube secured with: Tape Dental Injury: Teeth and Oropharynx as per pre-operative assessment

## 2018-03-21 ENCOUNTER — Encounter (HOSPITAL_COMMUNITY): Payer: Self-pay | Admitting: General Surgery

## 2018-03-22 ENCOUNTER — Telehealth: Payer: Self-pay | Admitting: General Surgery

## 2018-03-22 NOTE — Telephone Encounter (Signed)
[facility]  Informed patient of atypical hyperplasia and no invasive cancer in her biopsy results. She is doing well.   Curlene Labrum, MD [facility] [address] [place], [place] [address] [phone] (office)

## 2018-03-23 ENCOUNTER — Encounter: Payer: Self-pay | Admitting: General Surgery

## 2018-04-04 ENCOUNTER — Ambulatory Visit (INDEPENDENT_AMBULATORY_CARE_PROVIDER_SITE_OTHER): Payer: Self-pay | Admitting: General Surgery

## 2018-04-04 ENCOUNTER — Encounter: Payer: Self-pay | Admitting: General Surgery

## 2018-04-04 VITALS — BP 131/78 | HR 61 | Temp 98.0°F | Resp 16 | Ht 67.0 in | Wt 161.0 lb

## 2018-04-04 DIAGNOSIS — N6092 Unspecified benign mammary dysplasia of left breast: Secondary | ICD-10-CM

## 2018-04-04 NOTE — Patient Instructions (Signed)
Normal routine mammograms.

## 2018-04-04 NOTE — Progress Notes (Signed)
[facility] Note   HPI:  84 y.o. Female presents to clinic for post-op follow-up evaluation after a left breast biopsy. She is doing well and has no pain.   Review of Systems:  No fevers or chills No pain All other review of systems: otherwise negative   Pathology: Diagnosis Breast, lumpectomy, left - ATYPICAL DUCTAL HYPERPLASIA WITH APOCRINE FEATURES. - FIBROCYSTIC CHANGES WITH CALCIFICATIONS. - NO INVASIVE CARCINOMA. - PREVIOUS BIOPSY SITE AND BIOPSY CLIP.  Vital Signs:  BP 131/78 (BP Location: Left Arm, Patient Position: Sitting, Cuff Size: Normal)   Pulse 61   Temp 98 F (36.7 C) (Temporal)   Resp 16   Ht 5\' 7"  (1.702 m)   Wt 161 lb (73 kg)   BMI 25.22 kg/m    Physical Exam:  Physical Exam  Constitutional: She appears well-developed and well-nourished.  Cardiovascular: Normal rate.  Pulmonary/Chest: Effort normal.  Left breast incision with dermabond, minimal localized redness around the glue, not extending, no signs of infection, some induration    Laboratory studies: None   Imaging:  None    Assessment:  84 y.o. yo Female with atypical hyperplasia but no concerning additional pathology. Doing well.   Plan:  - Can proceed with regular mammogram schedule   - No additional surgery needed  - Follow up PRN   All of the above recommendations were discussed with the patient, and all of patient's questions were answered to her expressed satisfaction.  Curlene Labrum, MD [facility] [address] [place], [place] [address] [phone] (office)

## 2020-05-07 ENCOUNTER — Other Ambulatory Visit: Payer: Self-pay

## 2020-05-07 ENCOUNTER — Emergency Department (HOSPITAL_COMMUNITY)
Admission: EM | Admit: 2020-05-07 | Discharge: 2020-05-07 | Disposition: A | Discharge status: Home / Self Care | Payer: Medicare Other | Attending: Emergency Medicine | Admitting: Emergency Medicine

## 2020-05-07 ENCOUNTER — Encounter (HOSPITAL_COMMUNITY): Payer: Self-pay | Admitting: Emergency Medicine

## 2020-05-07 ENCOUNTER — Emergency Department (HOSPITAL_COMMUNITY): Payer: Medicare Other

## 2020-05-07 DIAGNOSIS — S8991XA Unspecified injury of right lower leg, initial encounter: Secondary | ICD-10-CM | POA: Diagnosis present

## 2020-05-07 DIAGNOSIS — Z7982 Long term (current) use of aspirin: Secondary | ICD-10-CM | POA: Diagnosis not present

## 2020-05-07 DIAGNOSIS — Z7984 Long term (current) use of oral hypoglycemic drugs: Secondary | ICD-10-CM | POA: Insufficient documentation

## 2020-05-07 DIAGNOSIS — Y92009 Unspecified place in unspecified non-institutional (private) residence as the place of occurrence of the external cause: Secondary | ICD-10-CM | POA: Insufficient documentation

## 2020-05-07 DIAGNOSIS — E119 Type 2 diabetes mellitus without complications: Secondary | ICD-10-CM | POA: Diagnosis not present

## 2020-05-07 DIAGNOSIS — W2209XA Striking against other stationary object, initial encounter: Secondary | ICD-10-CM | POA: Insufficient documentation

## 2020-05-07 DIAGNOSIS — Y9389 Activity, other specified: Secondary | ICD-10-CM | POA: Insufficient documentation

## 2020-05-07 DIAGNOSIS — I10 Essential (primary) hypertension: Secondary | ICD-10-CM | POA: Diagnosis not present

## 2020-05-07 DIAGNOSIS — Y999 Unspecified external cause status: Secondary | ICD-10-CM | POA: Diagnosis not present

## 2020-05-07 DIAGNOSIS — S8011XA Contusion of right lower leg, initial encounter: Secondary | ICD-10-CM | POA: Insufficient documentation

## 2020-05-07 DIAGNOSIS — Z79899 Other long term (current) drug therapy: Secondary | ICD-10-CM | POA: Insufficient documentation

## 2020-05-07 NOTE — Discharge Instructions (Addendum)
Elevate your leg when possible.  You may apply warm wet compresses or soaks to your leg.  Wear the Ace wrap when standing or walking.  You may remove for bathing and bedtime.    Your blood pressure this evening is elevated, be sure to take your blood pressure medications daily.  Call Dr. Vickey Sages office to arrange a follow-up appointment.

## 2020-05-07 NOTE — ED Triage Notes (Addendum)
Pt reports hit right leg on a table while trying to get up from a chair. Pt denies pain or being on blood thinner. Large knot noted to RLE. Pt able to bear weight and ambulate with steady gait.

## 2020-05-07 NOTE — ED Provider Notes (Signed)
[facility] EMERGENCY DEPARTMENT Provider Note   CSN: [id number] Arrival date & time: 05/07/20  1653     History Chief Complaint  Patient presents with  . Leg Pain    Candice Brewer is a 86 y.o. female.  HPI      Candice Brewer is a 86 y.o. female with past medical history of diabetes, hypertension, and arthritis who presents to the Emergency Department complaining of right lateral lower leg pain for 1 day.  She states that she accidentally struck her right lower leg on a wooden table while she attempted to stand from a seated position.  She denies fall.  Incident occurred yesterday.  Today, while taking a shower she noticed the area of swelling to her right lower leg.  She denies pain, numbness, tingling redness and bruising of the lower leg.  No tenderness to the posterior lower leg.  She takes 81 mg aspirin daily but denies other blood thinners.  She states she tried to go to the local clinic but was recommended to come to the emergency department for further evaluation.  No history of PE or DVT.   Past Medical History:  Diagnosis Date  . Arthritis   . Chest pain, unspecified   . Constipation   . DM (diabetes mellitus) ([place])   . Dry eyes   . Esophageal stricture 09/2011   schatzki's ring  . Fibroids    uterus  . Glaucoma   . Helicobacter pylori gastritis 2012   Treated  . HTN (hypertension)   . Hyperlipidemia   . Restless legs syndrome (RLS)   . Vitamin D deficiency     Patient Active Problem List   Diagnosis Date Noted  . Atypical ductal hyperplasia of left breast 03/08/2018  . Staphylococcus aureus infection 03/01/2016  . Pneumonia 02/27/2016  . CAP (community acquired pneumonia) 02/27/2016  . Pleuritic chest pain 02/27/2016  . Heme positive stool 03/07/2015  . Fecal incontinence 03/07/2015  . Schatzki's ring 01/03/2012    Class: History of  . FH: colon cancer 09/16/2011  . Esophageal dysphagia 09/16/2011  . CATARACT, LEFT EYE 06/20/2009  . CALLUSES,  FEET, BILATERAL 06/20/2009  . SKIN TAG 03/20/2008  . HYPERLIPIDEMIA 02/08/2008  . Type 2 diabetes mellitus without complication ([place]) 51/88/4166  . FIBROIDS, UTERUS 10/28/2006  . Essential hypertension 10/28/2006  . ARTHRITIS 10/28/2006    Past Surgical History:  Procedure Laterality Date  . APPENDECTOMY    . BREAST BIOPSY Left 03/20/2018   Procedure: BREAST BIOPSY WITH NEEDLE LOCALIZATION;  Surgeon: Virl Cagey, MD;  Location: [facility];  Service: General;  Laterality: Left;  . CARDIOVASCULAR STRESS TEST  2009  . CATARACT EXTRACTION  2010   right eye  . CATARACT EXTRACTION  2012   left eye  . COLONOSCOPY  08/2006   Dr. Vivi Ferns  . COLONOSCOPY  10/13/2011   Normal. Next TCS 09/2016.   Procedure: COLONOSCOPY;  Surgeon: Daneil Dolin, MD;  Location: [facility];  Service: Endoscopy;  Laterality: N/A;  8:15  . COLONOSCOPY N/A 03/10/2015   RMR: External and anal canal hemorrhoids likely source of hematochezia. Redundant colon. Single sigmoid polyp removed ad described above.   . ESOPHAGOGASTRODUODENOSCOPY  06/2008   Dr. Lorrin Mais ring s/p disruption, erosive reflux esophagitis, hh.   . ESOPHAGOGASTRODUODENOSCOPY  09/2011   Schatki's ring s/p dilation, multiple 1-22mm antral and bulbar erosions, bx positive for H.Pylori s/ treatment  . TUBAL LIGATION       OB History   No  obstetric history on file.     Family History  Problem Relation Age of Onset  . Prostate cancer Father   . Colon cancer Father        >age 87  . Cancer Paternal Grandmother        metastatic at time of diagnosis, primary unknown  . Cancer Paternal Aunt        metastatic at time of diagnosisi, primary unknown    Social History   Tobacco Use  . Smoking status: Never Smoker  . Smokeless tobacco: Never Used  . Tobacco comment: quit about 35+ yrs  Substance Use Topics  . Alcohol use: No    Alcohol/week: 0.0 standard drinks  . Drug use: No    Home Medications Prior to Admission  medications   Medication Sig Start Date End Date Taking? Authorizing Provider  aspirin EC 81 MG tablet Take 81 mg by mouth daily at 2 PM.    [provider]  brimonidine-timolol (COMBIGAN) 0.2-0.5 % ophthalmic solution Place 1 drop into both eyes 2 (two) times daily.     [provider]  cholecalciferol (VITAMIN D) 1000 units tablet Take 1,000 Units by mouth daily.    [provider]  diclofenac sodium (VOLTAREN) 1 % GEL Apply 1-2 g topically 4 (four) times daily as needed (for itchy skin).    [provider]  enalapril (VASOTEC) 20 MG tablet Take 20 mg by mouth daily.      [provider]  latanoprost (XALATAN) 0.005 % ophthalmic solution Place 1 drop into both eyes at bedtime.  12/31/14   [provider]  metFORMIN (GLUCOPHAGE-XR) 500 MG 24 hr tablet Take 1,000 mg by mouth 2 (two) times daily.    [provider]  Multiple Vitamin (MULTIVITAMIN WITH MINERALS) TABS tablet Take 1 tablet by mouth daily.    [provider]  Polyethyl Glycol-Propyl Glycol 0.4-0.3 % SOLN Apply 1 drop to eye 2 (two) times daily as needed (dry eyes).     [provider]  prednisoLONE acetate (PRED FORTE) 1 % ophthalmic suspension Place 1 drop into both eyes every Monday, Wednesday, and Friday.  10/09/12   [provider]    Allergies    Neomycin-bacitracin zn-polymyx, Pravastatin sodium, and Rosuvastatin  Review of Systems   Review of Systems  Constitutional: Negative for chills and fever.  Respiratory: Negative for chest tightness and shortness of breath.   Cardiovascular: Negative for chest pain.  Gastrointestinal: Negative for abdominal pain, nausea and vomiting.  Genitourinary: Negative for dysuria and flank pain.  Musculoskeletal: Positive for myalgias. Negative for arthralgias, back pain and joint swelling.  Skin: Negative for color change, rash and wound.  Neurological: Negative for dizziness, weakness and numbness.     Physical Exam Updated Vital Signs BP (!) 201/94 (BP Location: Right Arm)   Pulse 83   Temp 98.2 F (36.8 C) (Oral)   Resp 18   Ht 5\' 7"  (1.702 m)   Wt 69.4 kg   SpO2 100%   BMI 23.96 kg/m   Physical Exam Vitals and nursing note reviewed.  Constitutional:      Appearance: Normal appearance. She is not ill-appearing.  HENT:     Head: Atraumatic.  Cardiovascular:     Rate and Rhythm: Normal rate and regular rhythm.     Pulses: Normal pulses.  Pulmonary:     Effort: Pulmonary effort is normal.     Breath sounds: Normal breath sounds.  Chest:     Chest wall:  No tenderness.  Musculoskeletal:        General: Signs of injury present. No tenderness.     Cervical back: Normal range of motion.     Comments: 8 cm area of localized edema to the lateral right lower leg.  Mild ecchymosis noted.  Compartments soft.  No tenderness to palpation of the posterior lower leg, no palpable cords.  Skin:    General: Skin is warm.     Capillary Refill: Capillary refill takes less than 2 seconds.     Findings: No erythema or rash.  Neurological:     General: No focal deficit present.     Mental Status: She is alert.     Sensory: No sensory deficit.     Motor: No weakness.     ED Results / Procedures / Treatments   Labs (all labs ordered are listed, but only abnormal results are displayed) Labs Reviewed - No data to display  EKG None  Radiology DG Tibia/Fibula Right  Result Date: 05/07/2020 CLINICAL DATA:  Trauma right lower leg 1 day prior, swelling and pain EXAM: RIGHT TIBIA AND FIBULA - 2 VIEW COMPARISON:  None. FINDINGS: Frontal and lateral views of the right tibia and fibula are obtained. No acute displaced fracture. Alignment is anatomic. There is diffuse soft tissue edema, greatest along the anterolateral aspect of the right lower leg. IMPRESSION: 1. Soft tissue edema, no acute fracture. Electronically Signed   By: Randa Ngo M.D.   On: 05/07/2020 19:25     Procedures Procedures (including critical care time)  Medications Ordered in ED Medications - No data to display  ED Course  I have reviewed the triage vital signs and the nursing notes.  Pertinent labs & imaging results that were available during my care of the patient were reviewed by me and considered in my medical decision making (see chart for details).    MDM Rules/Calculators/A&P                      Patient here with focal area of edema to the lateral aspect of the right lower leg.  Exam findings consistent with hematoma.  X-ray of the right lower leg is negative for bony injury.  No posterior calf pain or palpable cord.  No erythema or excessive warmth.  Doubt DVT.  Patient is ambulatory and able to bear weight without difficulty.  Gait steady.  She agrees to treatment plan with elevation, warm compresses and Ace wrap for comfort and support.  Return precautions discussed.  Upon disposition, patient's blood pressure remains elevated.  She does take enalapril daily.  Denies symptoms currently.  Has PCP follow-up appointment for next week.  Return precautions given.   Final Clinical Impression(s) / ED Diagnoses Final diagnoses:  Hematoma of right lower leg  Hypertension, unspecified type    Rx / DC Orders ED Discharge Orders    None       Bufford Lope 05/07/20 2105    Elnora Morrison, MD 05/07/20 2349

## 2020-06-11 ENCOUNTER — Other Ambulatory Visit (HOSPITAL_COMMUNITY): Payer: Self-pay | Admitting: Family Medicine

## 2020-06-11 DIAGNOSIS — Z1231 Encounter for screening mammogram for malignant neoplasm of breast: Secondary | ICD-10-CM

## 2020-06-19 ENCOUNTER — Other Ambulatory Visit: Payer: Self-pay

## 2020-06-19 ENCOUNTER — Encounter (HOSPITAL_COMMUNITY): Payer: Self-pay

## 2020-06-19 ENCOUNTER — Ambulatory Visit (HOSPITAL_COMMUNITY)
Admission: RE | Admit: 2020-06-19 | Discharge: 2020-06-19 | Disposition: A | Discharge status: Home / Self Care | Payer: Medicare Other | Source: Ambulatory Visit | Attending: Family Medicine | Admitting: Family Medicine

## 2020-06-19 DIAGNOSIS — Z1231 Encounter for screening mammogram for malignant neoplasm of breast: Secondary | ICD-10-CM | POA: Insufficient documentation

## 2021-05-18 ENCOUNTER — Other Ambulatory Visit (HOSPITAL_COMMUNITY): Payer: Self-pay | Admitting: Family Medicine

## 2021-05-18 DIAGNOSIS — Z1231 Encounter for screening mammogram for malignant neoplasm of breast: Secondary | ICD-10-CM

## 2021-06-22 ENCOUNTER — Ambulatory Visit (HOSPITAL_COMMUNITY)
Admission: RE | Admit: 2021-06-22 | Discharge: 2021-06-22 | Disposition: A | Discharge status: Home / Self Care | Source: Ambulatory Visit | Attending: Family Medicine | Admitting: Family Medicine

## 2021-06-22 ENCOUNTER — Other Ambulatory Visit: Payer: Self-pay

## 2021-06-22 DIAGNOSIS — Z1231 Encounter for screening mammogram for malignant neoplasm of breast: Secondary | ICD-10-CM | POA: Insufficient documentation

## 2022-05-12 ENCOUNTER — Other Ambulatory Visit (HOSPITAL_COMMUNITY): Payer: Self-pay | Admitting: Family Medicine

## 2022-05-12 DIAGNOSIS — Z1231 Encounter for screening mammogram for malignant neoplasm of breast: Secondary | ICD-10-CM

## 2022-06-24 ENCOUNTER — Ambulatory Visit (HOSPITAL_COMMUNITY)
Admission: RE | Admit: 2022-06-24 | Discharge: 2022-06-24 | Disposition: A | Discharge status: Home / Self Care | Payer: Medicare Other | Source: Ambulatory Visit | Attending: Family Medicine | Admitting: Family Medicine

## 2022-06-24 DIAGNOSIS — Z1231 Encounter for screening mammogram for malignant neoplasm of breast: Secondary | ICD-10-CM | POA: Diagnosis not present

## 2022-07-13 ENCOUNTER — Ambulatory Visit (INDEPENDENT_AMBULATORY_CARE_PROVIDER_SITE_OTHER): Payer: Medicare Other | Admitting: Nurse Practitioner

## 2022-07-13 ENCOUNTER — Encounter: Payer: Self-pay | Admitting: Nurse Practitioner

## 2022-07-13 DIAGNOSIS — I10 Essential (primary) hypertension: Secondary | ICD-10-CM

## 2022-07-13 DIAGNOSIS — E119 Type 2 diabetes mellitus without complications: Secondary | ICD-10-CM

## 2022-07-13 MED ORDER — AMLODIPINE BESYLATE 5 MG PO TABS: 5.0000 mg | ORAL_TABLET | Freq: Every day | ORAL | 0 refills | Status: DC

## 2022-07-13 NOTE — Progress Notes (Signed)
))\  New Patient Note  RE: Candice Brewer MRN: [id number] DOB: [birth year] Date of Office Visit: 07/13/2022  Chief Complaint: Establish Care   History of Present Illness:  Patient is establishing care and presents for follow up of hypertension. Patient was diagnosed in 10/29/2011. The patient is tolerating the medication well without side effects. Compliance with treatment has been good; including taking medication as directed , maintains a healthy diet and regular exercise regimen , and following up as directed.    The patient is establishing care today and presents with history of type 2 diabetes mellitus without complications. Patient was diagnosed in 2008. Compliance with treatment has been good; the patient takes medication as directed , maintains a diabetic diet and an exercise regimen , follows up as directed , and is keeping a glucose diary. Sugars runs patient did not bring a blood pressure log in clinic today.. Patient specifically denies associated symptoms, including blurred vision, fatigue, polydipsia, polyphagia and polyuria . Patient denies hypoglycemia. In regard to preventative care, the patient performs foot self-exams daily and last ophthalmology exam was in last year 2022.      Assessment and Plan: Candice Brewer is a 87 y.o. female with: Essential hypertension Patient states Today establishing care with a history of hypertension.  Completed labs results pending.  Patient's hypertension is well controlled.  Patient is wanting to review current medication.  We will review and follow-up every 3 months.  Type 2 diabetes mellitus without complication (HCC) Patient's diabetes is well controlled on current medication metformin at 1000 mg tablet by mouth twice daily and Januvia.  Completed labs results pending.  Will recheck A1c and reevaluate patient's current medication.  Return in about 3 months (around 10/13/2022).   Diagnostics:   Past Medical History: Patient Active Problem  List   Diagnosis Date Noted   Atypical ductal hyperplasia of left breast 03/08/2018   Staphylococcus aureus infection 03/01/2016   Pneumonia 02/27/2016   CAP (community acquired pneumonia) 02/27/2016   Pleuritic chest pain 02/27/2016   Heme positive stool 03/07/2015   Fecal incontinence 03/07/2015   Schatzki's ring 01/03/2012    Class: History of   FH: colon cancer 09/16/2011   Esophageal dysphagia 09/16/2011   CATARACT, LEFT EYE 06/20/2009   CALLUSES, FEET, BILATERAL 06/20/2009   SKIN TAG 03/20/2008   HYPERLIPIDEMIA 02/08/2008   Type 2 diabetes mellitus without complication ([place]) 26/71/2458   FIBROIDS, UTERUS 10/28/2006   Essential hypertension 10/28/2006   ARTHRITIS 10/28/2006   Past Medical History:  Diagnosis Date   Arthritis    Chest pain, unspecified    Constipation    DM (diabetes mellitus) ([place])    Dry eyes    Esophageal stricture 09/2011   schatzki's ring   Fibroids    uterus   Glaucoma    Helicobacter pylori gastritis 2012   Treated   HTN (hypertension)    Hyperlipidemia    Restless legs syndrome (RLS)    Vitamin D deficiency    Past Surgical History: Past Surgical History:  Procedure Laterality Date   APPENDECTOMY     BREAST BIOPSY Left 03/20/2018   Procedure: BREAST BIOPSY WITH NEEDLE LOCALIZATION;  Surgeon: Virl Cagey, MD;  Location: [facility];  Service: General;  Laterality: Left;   BREAST EXCISIONAL BIOPSY Left 2019   Atypical hyperplasia removed   CARDIOVASCULAR STRESS TEST  2009   CATARACT EXTRACTION  2010   right eye   CATARACT EXTRACTION  2012   left eye   COLONOSCOPY  08/2006  Dr. Vivi Ferns   COLONOSCOPY  10/13/2011   Normal. Next TCS 09/2016.   Procedure: COLONOSCOPY;  Surgeon: Daneil Dolin, MD;  Location: [facility];  Service: Endoscopy;  Laterality: N/A;  8:15   COLONOSCOPY N/A 03/10/2015   RMR: External and anal canal hemorrhoids likely source of hematochezia. Redundant colon. Single sigmoid polyp removed ad described  above.    ESOPHAGOGASTRODUODENOSCOPY  06/2008   Dr. Lorrin Mais ring s/p disruption, erosive reflux esophagitis, hh.    ESOPHAGOGASTRODUODENOSCOPY  09/2011   Schatki's ring s/p dilation, multiple 1-28m antral and bulbar erosions, bx positive for H.Pylori s/ treatment   TUBAL LIGATION     Medication List:  Current Outpatient Medications  Medication Sig Dispense Refill   amLODipine (NORVASC) 5 MG tablet Take 5 mg by mouth daily.     aspirin EC 81 MG tablet Take 81 mg by mouth daily at 2 PM.     brimonidine (ALPHAGAN) 0.2 % ophthalmic solution Place one drop into the left eye 3 (three) times a day. HOLD LEFT EYE     brimonidine-timolol (COMBIGAN) 0.2-0.5 % ophthalmic solution Place 1 drop into both eyes 2 (two) times daily.      cholecalciferol (VITAMIN D) 1000 units tablet Take 1,000 Units by mouth daily.     cholecalciferol (VITAMIN D3) 25 MCG (1000 UNIT) tablet Take 1,000 Units by mouth daily.     diclofenac sodium (VOLTAREN) 1 % GEL Apply 1-2 g topically 4 (four) times daily as needed (for itchy skin).     enalapril (VASOTEC) 20 MG tablet Take 20 mg by mouth daily.       hydrochlorothiazide (MICROZIDE) 12.5 MG capsule Take 12.5 mg by mouth daily.     latanoprost (XALATAN) 0.005 % ophthalmic solution Place 1 drop into both eyes at bedtime.      metFORMIN (GLUCOPHAGE-XR) 500 MG 24 hr tablet Take 1,000 mg by mouth 2 (two) times daily.     Multiple Vitamin (MULTIVITAMIN WITH MINERALS) TABS tablet Take 1 tablet by mouth daily.     Polyethyl Glycol-Propyl Glycol 0.4-0.3 % SOLN Apply 1 drop to eye 2 (two) times daily as needed (dry eyes).      prednisoLONE acetate (PRED FORTE) 1 % ophthalmic suspension Place 1 drop into both eyes every Monday, Wednesday, and Friday.      sodium chloride (MURO 128) 5 % ophthalmic ointment Apply to eye.     No current facility-administered medications for this visit.   Allergies: Allergies  Allergen Reactions   Neomycin-Bacitracin Zn-Polymyx      REACTION: "Severe skin rash"   Pravastatin Sodium     REACTION: "Funny feeling and leg pain"   Rosuvastatin Rash   Social History: Social History   Socioeconomic History   Marital status: Divorced    Spouse name: Not on file   Number of children: 5   Years of education: Not on file   Highest education level: Not on file  Occupational History   Occupation: retired bCustomer service manager Tobacco Use   Smoking status: Never   Smokeless tobacco: Never   Tobacco comments:    quit about 35+ yrs  Vaping Use   Vaping Use: Never used  Substance and Sexual Activity   Alcohol use: No    Alcohol/week: 0.0 standard drinks of alcohol   Drug use: No   Sexual activity: Not Currently  Other Topics Concern   Not on file  Social History Narrative   Not on file   Social Determinants of Health   Financial  Resource Strain: Not on file  Food Insecurity: Not on file  Transportation Needs: Not on file  Physical Activity: Not on file  Stress: Not on file  Social Connections: Not on file       Family History: Family History  Problem Relation Age of Onset   Prostate cancer Father    Colon cancer Father        >age 7   Cancer Paternal Grandmother        metastatic at time of diagnosis, primary unknown   Cancer Paternal Aunt        metastatic at time of diagnosisi, primary unknown         Review of Systems  Constitutional: Negative.   HENT: Negative.    Eyes: Negative.   Respiratory: Negative.    Cardiovascular: Negative.   Gastrointestinal: Negative.   Musculoskeletal: Negative.   Skin: Negative.   All other systems reviewed and are negative.  Objective: BP 131/70   Pulse 76   Temp 98.4 F (36.9 C)   Ht '5\' 7"'$  (1.702 m)   Wt 158 lb (71.7 kg)   SpO2 99%   BMI 24.75 kg/m  Body mass index is 24.75 kg/m. Physical Exam Vitals and nursing note reviewed.  Constitutional:      Appearance: Normal appearance.  HENT:     Head: Normocephalic.     Right Ear: External ear normal.      Left Ear: External ear normal.     Nose: Nose normal.     Mouth/Throat:     Mouth: Mucous membranes are moist.     Pharynx: Oropharynx is clear.  Eyes:     Conjunctiva/sclera: Conjunctivae normal.  Cardiovascular:     Rate and Rhythm: Normal rate and regular rhythm.     Pulses: Normal pulses.     Heart sounds: Normal heart sounds.  Pulmonary:     Effort: Pulmonary effort is normal.     Breath sounds: Normal breath sounds.  Abdominal:     General: Bowel sounds are normal.  Musculoskeletal:     Cervical back: Normal range of motion.  Skin:    General: Skin is warm.     Findings: No erythema or rash.  Neurological:     General: No focal deficit present.     Mental Status: She is alert and oriented to person, place, and time.  Psychiatric:        Behavior: Behavior normal.    The plan was reviewed with the patient/family, and all questions/concerned were addressed.  It was my pleasure to see [facility] today and participate in her care. Please feel free to contact me with any questions or concerns.  Sincerely,  Jac Canavan NP [place]

## 2022-07-13 NOTE — Assessment & Plan Note (Signed)
Patient states Today establishing care with a history of hypertension.  Completed labs results pending.  Patient's hypertension is well controlled.  Patient is wanting to review current medication.  We will review and follow-up every 3 months.

## 2022-07-13 NOTE — Patient Instructions (Signed)
Hypertension, Adult Hypertension is another name for high blood pressure. High blood pressure forces your heart to work harder to pump blood. This can cause problems over time. There are two numbers in a blood pressure reading. There is a top number (systolic) over a bottom number (diastolic). It is best to have a blood pressure that is below 120/80. What are the causes? The cause of this condition is not known. Some other conditions can lead to high blood pressure. What increases the risk? Some lifestyle factors can make you more likely to develop high blood pressure: Smoking. Not getting enough exercise or physical activity. Being overweight. Having too much fat, sugar, calories, or salt (sodium) in your diet. Drinking too much alcohol. Other risk factors include: Having any of these conditions: Heart disease. Diabetes. High cholesterol. Kidney disease. Obstructive sleep apnea. Having a family history of high blood pressure and high cholesterol. Age. The risk increases with age. Stress. What are the signs or symptoms? High blood pressure may not cause symptoms. Very high blood pressure (hypertensive crisis) may cause: Headache. Fast or uneven heartbeats (palpitations). Shortness of breath. Nosebleed. Vomiting or feeling like you may vomit (nauseous). Changes in how you see. Very bad chest pain. Feeling dizzy. Seizures. How is this treated? This condition is treated by making healthy lifestyle changes, such as: Eating healthy foods. Exercising more. Drinking less alcohol. Your doctor may prescribe medicine if lifestyle changes do not help enough and if: Your top number is above 130. Your bottom number is above 80. Your personal target blood pressure may vary. Follow these instructions at home: Eating and drinking  If told, follow the DASH eating plan. To follow this plan: Fill one half of your plate at each meal with fruits and vegetables. Fill one fourth of your plate  at each meal with whole grains. Whole grains include whole-wheat pasta, brown rice, and whole-grain bread. Eat or drink low-fat dairy products, such as skim milk or low-fat yogurt. Fill one fourth of your plate at each meal with low-fat (lean) proteins. Low-fat proteins include fish, chicken without skin, eggs, beans, and tofu. Avoid fatty meat, cured and processed meat, or chicken with skin. Avoid pre-made or processed food. Limit the amount of salt in your diet to less than 1,500 mg each day. Do not drink alcohol if: Your doctor tells you not to drink. You are pregnant, may be pregnant, or are planning to become pregnant. If you drink alcohol: Limit how much you have to: 0-1 drink a day for women. 0-2 drinks a day for men. Know how much alcohol is in your drink. In the [place] one drink equals one 12 oz bottle of beer (355 mL), one 5 oz glass of wine (148 mL), or one 1 oz glass of hard liquor (44 mL). Lifestyle  Work with your doctor to stay at a healthy weight or to lose weight. Ask your doctor what the best weight is for you. Get at least 30 minutes of exercise that causes your heart to beat faster (aerobic exercise) most days of the week. This may include walking, swimming, or biking. Get at least 30 minutes of exercise that strengthens your muscles (resistance exercise) at least 3 days a week. This may include lifting weights or doing Pilates. Do not smoke or use any products that contain nicotine or tobacco. If you need help quitting, ask your doctor. Check your blood pressure at home as told by your doctor. Keep all follow-up visits. Medicines Take over-the-counter and prescription medicines  only as told by your doctor. Follow directions carefully. Do not skip doses of blood pressure medicine. The medicine does not work as well if you skip doses. Skipping doses also puts you at risk for problems. Ask your doctor about side effects or reactions to medicines that you should watch  for. Contact a doctor if: You think you are having a reaction to the medicine you are taking. You have headaches that keep coming back. You feel dizzy. You have swelling in your ankles. You have trouble with your vision. Get help right away if: You get a very bad headache. You start to feel mixed up (confused). You feel weak or numb. You feel faint. You have very bad pain in your: Chest. Belly (abdomen). You vomit more than once. You have trouble breathing. These symptoms may be an emergency. Get help right away. Call 911. Do not wait to see if the symptoms will go away. Do not drive yourself to the hospital. Summary Hypertension is another name for high blood pressure. High blood pressure forces your heart to work harder to pump blood. For most people, a normal blood pressure is less than 120/80. Making healthy choices can help lower blood pressure. If your blood pressure does not get lower with healthy choices, you may need to take medicine. This information is not intended to replace advice given to you by your health care provider. Make sure you discuss any questions you have with your health care provider. Document Revised: 09/03/2021 Document Reviewed: 09/03/2021 Elsevier Patient Education  2023 [employer] Diabetes Insipidus Diabetes insipidus (DI) is a rare condition that causes the body to produce more urine than normal. This leads to thirst and low fluid in the body (dehydration). In this condition, the urine is made mostly of water, or dilute urine. DI affects mostly adults, but it can happen at any age. There are four types of DI: Central DI. This is the most common type. Dipsogenic DI. Nephrogenic DI. Gestational DI. The most common forms of this condition are caused by a decrease in the production of the hormone that regulates urine output (antidiuretic hormone), or the body's resistance to this hormone. This condition is not related to type 1 or type 2 diabetes  mellitus. What are the causes? Central DI is caused by damage to the pituitary gland or hypothalamus in the brain. Dipsogenic DI is caused by a defect in the thirst mechanism in the brain. This defect causes you to drink too much fluid. These may result from: Brain surgery. Infection. Inflammation. Brain tumor. Head injury. Nephrogenic DI is caused by the kidneys not responding to the antidiuretic hormone in the body. This may result from: Chronic kidney disease (CKD). Certain medicines, such as lithium. Low potassium levels. High calcium levels. Gestational DI is rare and is caused by the antidiuretic hormone that has stopped working properly. What are the signs or symptoms? Symptoms of this condition include: Excessive urination. This means urinating more than 10 cups (2.4 L) during a period of 24 hours. Excessive thirst. Too much nighttime urination (nocturia). Nausea. Diarrhea. How is this diagnosed? This condition may be diagnosed based on: Your medical history. A physical exam. Blood tests. Urine tests. A water deprivation test. During this test, you will stop drinking fluids for a period of time and your blood and urine will be checked regularly. An MRI. How is this treated? Once your specific type of diabetes insipidus is diagnosed, treatment may include one or more of the following: Increasing or   limiting your fluid intake. Taking medicines that contain artificial (synthetic) versions of the antidiuretic hormone. Stopping certain medicines that you take. Correcting the balance of minerals (electrolytes) in your body. Changing your diet. You may be put on a low-protein and low-sodium diet. You may need to visit your health care provider regularly to make sure your condition is being treated properly. You may also need to work with providers who specialize in: Kidney problems (nephrologist). Hormone disorders (endocrinologist). Follow these instructions at  home:  Eating and drinking Follow instructions from your health care provider about how much fluid and water to drink. You may be directed to drink more fluids and water, or to limit how much fluid and water you drink. Follow instructions from your health care provider about eating or drinking restrictions. General instructions Take over-the-counter and prescription medicines only as told by your health care provider. If directed, monitor your risk of dehydration in extreme heat. Carry a medical alert card or wear medical alert jewelry. Keep all follow-up visits as told by your health care provider. This is important. You may need to visit your health care provider regularly to make sure your condition is being treated properly. Contact a health care provider if: You continue to have symptoms after treatment. Get help right away if: You have extreme thirst. You have symptoms of severe dehydration, such as rapid heart rate, muscle cramps, or confusion. Summary Diabetes insipidus (DI) is a rare condition that causes the body to produce more urine than normal, which leads to thirst and dehydration. Follow instructions from your health care provider about eating or drinking restrictions. Treatment may include increasing or limiting your fluid intake and correcting the balance of minerals (electrolytes) in your body. Get help right away if you have symptoms of severe dehydration, such as rapid heart rate, muscle cramps, or confusion. This information is not intended to replace advice given to you by your health care provider. Make sure you discuss any questions you have with your health care provider. Document Revised: 12/19/2019 Document Reviewed: 12/19/2019 Elsevier Patient Education  [place].

## 2022-07-13 NOTE — Assessment & Plan Note (Signed)
Patient's diabetes is well controlled on current medication metformin at 1000 mg tablet by mouth twice daily and Januvia.  Completed labs results pending.  Will recheck A1c and reevaluate patient's current medication.

## 2022-07-14 ENCOUNTER — Other Ambulatory Visit: Payer: Self-pay | Admitting: Nurse Practitioner

## 2022-07-14 ENCOUNTER — Other Ambulatory Visit: Payer: TRICARE For Life (TFL)

## 2022-07-14 ENCOUNTER — Telehealth: Payer: Self-pay | Admitting: Nurse Practitioner

## 2022-07-14 DIAGNOSIS — E119 Type 2 diabetes mellitus without complications: Secondary | ICD-10-CM

## 2022-07-14 LAB — BAYER DCA HB A1C WAIVED: HB A1C (BAYER DCA - WAIVED): 6.9 % — ABNORMAL HIGH (ref 4.8–5.6)

## 2022-07-14 MED ORDER — JANUMET 50-1000 MG PO TABS: 1.0000 | ORAL_TABLET | Freq: Two times a day (BID) | ORAL | 1 refills | Status: DC

## 2022-07-14 MED ORDER — JANUMET 50-1000 MG PO TABS: 1.0000 | ORAL_TABLET | Freq: Two times a day (BID) | ORAL | 3 refills | Status: DC

## 2022-07-14 NOTE — Telephone Encounter (Signed)
She has refills!!!!!

## 2022-07-15 MED ORDER — JANUMET 50-1000 MG PO TABS: 1.0000 | ORAL_TABLET | Freq: Two times a day (BID) | ORAL | 0 refills | Status: DC

## 2022-07-15 NOTE — Telephone Encounter (Signed)
New prescription for Janumet for quantity of 180 sent to [facility] so patient will have what she needs for 90 days.

## 2022-09-21 DIAGNOSIS — Z947 Corneal transplant status: Secondary | ICD-10-CM | POA: Diagnosis not present

## 2022-09-21 DIAGNOSIS — H401131 Primary open-angle glaucoma, bilateral, mild stage: Secondary | ICD-10-CM | POA: Diagnosis not present

## 2022-10-05 DIAGNOSIS — Z947 Corneal transplant status: Secondary | ICD-10-CM | POA: Diagnosis not present

## 2022-10-15 ENCOUNTER — Encounter: Payer: Self-pay | Admitting: Nurse Practitioner

## 2022-10-15 ENCOUNTER — Telehealth: Payer: Self-pay | Admitting: Nurse Practitioner

## 2022-10-15 ENCOUNTER — Ambulatory Visit (INDEPENDENT_AMBULATORY_CARE_PROVIDER_SITE_OTHER): Payer: Medicare Other | Admitting: Nurse Practitioner

## 2022-10-15 VITALS — BP 147/68 | HR 68 | Temp 98.4°F | Ht 67.0 in | Wt 157.2 lb

## 2022-10-15 DIAGNOSIS — I10 Essential (primary) hypertension: Secondary | ICD-10-CM

## 2022-10-15 DIAGNOSIS — E119 Type 2 diabetes mellitus without complications: Secondary | ICD-10-CM | POA: Diagnosis not present

## 2022-10-15 LAB — BAYER DCA HB A1C WAIVED: HB A1C (BAYER DCA - WAIVED): 7.7 % — ABNORMAL HIGH (ref 4.8–5.6)

## 2022-10-15 MED ORDER — HYDROCHLOROTHIAZIDE 25 MG PO TABS: 25.0000 mg | ORAL_TABLET | Freq: Every day | ORAL | 3 refills | Status: DC

## 2022-10-15 NOTE — Patient Instructions (Addendum)
Hypertension, Adult High blood pressure (hypertension) is when the force of blood pumping through the arteries is too strong. The arteries are the blood vessels that carry blood from the heart throughout the body. Hypertension forces the heart to work harder to pump blood and may cause arteries to become narrow or stiff. Untreated or uncontrolled hypertension can lead to a heart attack, heart failure, a stroke, kidney disease, and other problems. A blood pressure reading consists of a higher number over a lower number. Ideally, your blood pressure should be below 120/80. The first ("top") number is called the systolic pressure. It is a measure of the pressure in your arteries as your heart beats. The second ("bottom") number is called the diastolic pressure. It is a measure of the pressure in your arteries as the heart relaxes. What are the causes? The exact cause of this condition is not known. There are some conditions that result in high blood pressure. What increases the risk? Certain factors may make you more likely to develop high blood pressure. Some of these risk factors are under your control, including: Smoking. Not getting enough exercise or physical activity. Being overweight. Having too much fat, sugar, calories, or salt (sodium) in your diet. Drinking too much alcohol. Other risk factors include: Having a personal history of heart disease, diabetes, high cholesterol, or kidney disease. Stress. Having a family history of high blood pressure and high cholesterol. Having obstructive sleep apnea. Age. The risk increases with age. What are the signs or symptoms? High blood pressure may not cause symptoms. Very high blood pressure (hypertensive crisis) may cause: Headache. Fast or irregular heartbeats (palpitations). Shortness of breath. Nosebleed. Nausea and vomiting. Vision changes. Severe chest pain, dizziness, and seizures. How is this diagnosed? This condition is diagnosed by  measuring your blood pressure while you are seated, with your arm resting on a flat surface, your legs uncrossed, and your feet flat on the floor. The cuff of the blood pressure monitor will be placed directly against the skin of your upper arm at the level of your heart. Blood pressure should be measured at least twice using the same arm. Certain conditions can cause a difference in blood pressure between your right and left arms. If you have a high blood pressure reading during one visit or you have normal blood pressure with other risk factors, you may be asked to: Return on a different day to have your blood pressure checked again. Monitor your blood pressure at home for 1 week or longer. If you are diagnosed with hypertension, you may have other blood or imaging tests to help your health care provider understand your overall risk for other conditions. How is this treated? This condition is treated by making healthy lifestyle changes, such as eating healthy foods, exercising more, and reducing your alcohol intake. You may be referred for counseling on a healthy diet and physical activity. Your health care provider may prescribe medicine if lifestyle changes are not enough to get your blood pressure under control and if: Your systolic blood pressure is above 130. Your diastolic blood pressure is above 80. Your personal target blood pressure may vary depending on your medical conditions, your age, and other factors. Follow these instructions at home: Eating and drinking  Eat a diet that is high in fiber and potassium, and low in sodium, added sugar, and fat. An example of this eating plan is called the DASH diet. DASH stands for Dietary Approaches to Stop Hypertension. To eat this way: Eat   plenty of fresh fruits and vegetables. Try to fill one half of your plate at each meal with fruits and vegetables. Eat whole grains, such as whole-wheat pasta, brown rice, or whole-grain bread. Fill about one  fourth of your plate with whole grains. Eat or drink low-fat dairy products, such as skim milk or low-fat yogurt. Avoid fatty cuts of meat, processed or cured meats, and poultry with skin. Fill about one fourth of your plate with lean proteins, such as fish, chicken without skin, beans, eggs, or tofu. Avoid pre-made and processed foods. These tend to be higher in sodium, added sugar, and fat. Reduce your daily sodium intake. Many people with hypertension should eat less than 1,500 mg of sodium a day. Do not drink alcohol if: Your health care provider tells you not to drink. You are pregnant, may be pregnant, or are planning to become pregnant. If you drink alcohol: Limit how much you have to: 0-1 drink a day for women. 0-2 drinks a day for men. Know how much alcohol is in your drink. In the [place] one drink equals one 12 oz bottle of beer (355 mL), one 5 oz glass of wine (148 mL), or one 1 oz glass of hard liquor (44 mL). Lifestyle  Work with your health care provider to maintain a healthy body weight or to lose weight. Ask what an ideal weight is for you. Get at least 30 minutes of exercise that causes your heart to beat faster (aerobic exercise) most days of the week. Activities may include walking, swimming, or biking. Include exercise to strengthen your muscles (resistance exercise), such as Pilates or lifting weights, as part of your weekly exercise routine. Try to do these types of exercises for 30 minutes at least 3 days a week. Do not use any products that contain nicotine or tobacco. These products include cigarettes, chewing tobacco, and vaping devices, such as e-cigarettes. If you need help quitting, ask your health care provider. Monitor your blood pressure at home as told by your health care provider. Keep all follow-up visits. This is important. Medicines Take over-the-counter and prescription medicines only as told by your health care provider. Follow directions carefully. Blood  pressure medicines must be taken as prescribed. Do not skip doses of blood pressure medicine. Doing this puts you at risk for problems and can make the medicine less effective. Ask your health care provider about side effects or reactions to medicines that you should watch for. Contact a health care provider if you: Think you are having a reaction to a medicine you are taking. Have headaches that keep coming back (recurring). Feel dizzy. Have swelling in your ankles. Have trouble with your vision. Get help right away if you: Develop a severe headache or confusion. Have unusual weakness or numbness. Feel faint. Have severe pain in your chest or abdomen. Vomit repeatedly. Have trouble breathing. These symptoms may be an emergency. Get help right away. Call 911. Do not wait to see if the symptoms will go away. Do not drive yourself to the hospital. Summary Hypertension is when the force of blood pumping through your arteries is too strong. If this condition is not controlled, it may put you at risk for serious complications. Your personal target blood pressure may vary depending on your medical conditions, your age, and other factors. For most people, a normal blood pressure is less than 120/80. Hypertension is treated with lifestyle changes, medicines, or a combination of both. Lifestyle changes include losing weight, eating a healthy,   low-sodium diet, exercising more, and limiting alcohol. This information is not intended to replace advice given to you by your health care provider. Make sure you discuss any questions you have with your health care provider. Document Revised: 09/22/2021 Document Reviewed: 09/22/2021 Elsevier Patient Education  [place]. Diabetes Mellitus Basics  Diabetes mellitus, or diabetes, is a long-term (chronic) disease. It occurs when the body does not properly use sugar (glucose) that is released from food after you eat. Diabetes mellitus may be caused by  one or both of these problems: Your pancreas does not make enough of a hormone called insulin. Your body does not react in a normal way to the insulin that it makes. Insulin lets glucose enter cells in your body. This gives you energy. If you have diabetes, glucose cannot get into cells. This causes high blood glucose (hyperglycemia). How to treat and manage diabetes You may need to take insulin or other diabetes medicines daily to keep your glucose in balance. If you are prescribed insulin, you will learn how to give yourself insulin by injection. You may need to adjust the amount of insulin you take based on the foods that you eat. You will need to check your blood glucose levels using a glucose monitor as told by your health care provider. The readings can help determine if you have low or high blood glucose. Generally, you should have these blood glucose levels: Before meals (preprandial): 80-130 mg/dL (4.4-7.2 mmol/L). After meals (postprandial): below 180 mg/dL (10 mmol/L). Hemoglobin A1c (HbA1c) level: less than 7%. Your health care provider will set treatment goals for you. Keep all follow-up visits. This is important. Follow these instructions at home: Diabetes medicines Take your diabetes medicines every day as told by your health care provider. List your diabetes medicines here: Name of medicine: ______________________________ Amount (dose): _______________ Time (a.m./p.m.): _______________ Notes: ___________________________________ Name of medicine: ______________________________ Amount (dose): _______________ Time (a.m./p.m.): _______________ Notes: ___________________________________ Name of medicine: ______________________________ Amount (dose): _______________ Time (a.m./p.m.): _______________ Notes: ___________________________________ Insulin If you use insulin, list the types of insulin you use here: Insulin type: ______________________________ Amount (dose):  _______________ Time (a.m./p.m.): _______________Notes: ___________________________________ Insulin type: ______________________________ Amount (dose): _______________ Time (a.m./p.m.): _______________ Notes: ___________________________________ Insulin type: ______________________________ Amount (dose): _______________ Time (a.m./p.m.): _______________ Notes: ___________________________________ Insulin type: ______________________________ Amount (dose): _______________ Time (a.m./p.m.): _______________ Notes: ___________________________________ Insulin type: ______________________________ Amount (dose): _______________ Time (a.m./p.m.): _______________ Notes: ___________________________________ Managing blood glucose  Check your blood glucose levels using a glucose monitor as told by your health care provider. Write down the times that you check your glucose levels here: Time: _______________ Notes: ___________________________________ Time: _______________ Notes: ___________________________________ Time: _______________ Notes: ___________________________________ Time: _______________ Notes: ___________________________________ Time: _______________ Notes: ___________________________________ Time: _______________ Notes: ___________________________________  Low blood glucose Low blood glucose (hypoglycemia) is when glucose is at or below 70 mg/dL (3.9 mmol/L). Symptoms may include: Feeling: Hungry. Sweaty and clammy. Irritable or easily upset. Dizzy. Sleepy. Having: A fast heartbeat. A headache. A change in your vision. Numbness around the mouth, lips, or tongue. Having trouble with: Moving (coordination). Sleeping. Treating low blood glucose To treat low blood glucose, eat or drink something containing sugar right away. If you can think clearly and swallow safely, follow the 15:15 rule: Take 15 grams of a fast-acting carb (carbohydrate), as told by your health care provider. Some  fast-acting carbs are: Glucose tablets: take 3-4 tablets. Hard candy: eat 3-5 pieces. Fruit juice: drink 4 oz (120 mL). Regular (not diet) soda: drink 4-6 oz (120-180 mL). Honey  or sugar: eat 1 Tbsp (15 mL). Check your blood glucose levels 15 minutes after you take the carb. If your glucose is still at or below 70 mg/dL (3.9 mmol/L), take 15 grams of a carb again. If your glucose does not go above 70 mg/dL (3.9 mmol/L) after 3 tries, get help right away. After your glucose goes back to normal, eat a meal or a snack within 1 hour. Treating very low blood glucose If your glucose is at or below 54 mg/dL (3 mmol/L), you have very low blood glucose (severe hypoglycemia). This is an emergency. Do not wait to see if the symptoms will go away. Get medical help right away. Call your local emergency services (911 in the [place] Do not drive yourself to the hospital. Questions to ask your health care provider Should I talk with a diabetes educator? What equipment will I need to care for myself at home? What diabetes medicines do I need? When should I take them? How often do I need to check my blood glucose levels? What number can I call if I have questions? When is my follow-up visit? Where can I find a support group for people with diabetes? Where to find more information American Diabetes Association: www.diabetes.org Association of Diabetes Care and Education Specialists: www.diabeteseducator.org Contact a health care provider if: Your blood glucose is at or above 240 mg/dL (13.3 mmol/L) for 2 days in a row. You have been sick or have had a fever for 2 days or more, and you are not getting better. You have any of these problems for more than 6 hours: You cannot eat or drink. You feel nauseous. You vomit. You have diarrhea. Get help right away if: Your blood glucose is lower than 54 mg/dL (3 mmol/L). You get confused. You have trouble thinking clearly. You have trouble breathing. These  symptoms may represent a serious problem that is an emergency. Do not wait to see if the symptoms will go away. Get medical help right away. Call your local emergency services (911 in the [place] Do not drive yourself to the hospital. Summary Diabetes mellitus is a chronic disease that occurs when the body does not properly use sugar (glucose) that is released from food after you eat. Take insulin and diabetes medicines as told. Check your blood glucose every day, as often as told. Keep all follow-up visits. This is important. This information is not intended to replace advice given to you by your health care provider. Make sure you discuss any questions you have with your health care provider. Document Revised: 03/18/2020 Document Reviewed: 03/18/2020 Elsevier Patient Education  [place].

## 2022-10-15 NOTE — Assessment & Plan Note (Signed)
Patient is hypertension is not well controlled.  Patient is reporting variable numbers but did not bring a blood pressure log to clinic today.  Advised patient to continue low-sodium diet, continue exercise, incorporate vegetables and increase hydration.  Increase hydrochlorothiazide to 25 mg tablet by mouth daily.  Continue atorvastatin 5 mg tablet by mouth daily.  Take a blood pressure log twice daily sending values.  Follow-up in 4 weeks.

## 2022-10-15 NOTE — Telephone Encounter (Signed)
Canceled [place] and sent to [facility] patient aware

## 2022-10-15 NOTE — Progress Notes (Signed)
Established Patient Office Visit  Subjective   Patient ID: Candice Brewer, female    DOB: November 21, 1932  Age: 88 y.o. MRN: [id number]  Chief Complaint  Patient presents with   Medical Management of Chronic Issues    Hypertension This is a chronic problem. The current episode started more than 1 year ago. The problem is unchanged. The problem is controlled. Pertinent negatives include no anxiety, blurred vision, headaches, peripheral edema or shortness of breath. Risk factors for coronary artery disease include diabetes mellitus. Past treatments include calcium channel blockers and diuretics. The current treatment provides significant improvement.  Diabetes She presents for her follow-up diabetic visit. She has type 2 diabetes mellitus. No MedicAlert identification noted. There are no hypoglycemic associated symptoms. Pertinent negatives for hypoglycemia include no headaches. Pertinent negatives for diabetes include no blurred vision. There are no hypoglycemic complications. Symptoms are stable. There are no diabetic complications. Risk factors for coronary artery disease include post-menopausal. Current diabetic treatment includes oral agent (dual therapy). Her weight is stable. She is following a diabetic diet. Meal planning includes avoidance of concentrated sweets. She participates in exercise daily. She sees a podiatrist.Eye exam is current.    Patient Active Problem List   Diagnosis Date Noted   Atypical ductal hyperplasia of left breast 03/08/2018   Staphylococcus aureus infection 03/01/2016   Pneumonia 02/27/2016   CAP (community acquired pneumonia) 02/27/2016   Pleuritic chest pain 02/27/2016   Heme positive stool 03/07/2015   Fecal incontinence 03/07/2015   Schatzki's ring 01/03/2012    Class: History of   FH: colon cancer 09/16/2011   Esophageal dysphagia 09/16/2011   CATARACT, LEFT EYE 06/20/2009   CALLUSES, FEET, BILATERAL 06/20/2009   SKIN TAG 03/20/2008   HYPERLIPIDEMIA  02/08/2008   Type 2 diabetes mellitus without complication ([place]) 31/49/7026   FIBROIDS, UTERUS 10/28/2006   Essential hypertension 10/28/2006   ARTHRITIS 10/28/2006   Past Medical History:  Diagnosis Date   Arthritis    Chest pain, unspecified    Constipation    DM (diabetes mellitus) ([place])    Dry eyes    Esophageal stricture 09/2011   schatzki's ring   Fibroids    uterus   Glaucoma    Helicobacter pylori gastritis 2012   Treated   HTN (hypertension)    Hyperlipidemia    Restless legs syndrome (RLS)    Vitamin D deficiency    Past Surgical History:  Procedure Laterality Date   APPENDECTOMY     BREAST BIOPSY Left 03/20/2018   Procedure: BREAST BIOPSY WITH NEEDLE LOCALIZATION;  Surgeon: Virl Cagey, MD;  Location: [facility];  Service: General;  Laterality: Left;   BREAST EXCISIONAL BIOPSY Left 2019   Atypical hyperplasia removed   CARDIOVASCULAR STRESS TEST  2009   CATARACT EXTRACTION  2010   right eye   CATARACT EXTRACTION  2012   left eye   COLONOSCOPY  08/2006   Dr. Vivi Ferns   COLONOSCOPY  10/13/2011   Normal. Next TCS 09/2016.   Procedure: COLONOSCOPY;  Surgeon: Daneil Dolin, MD;  Location: [facility];  Service: Endoscopy;  Laterality: N/A;  8:15   COLONOSCOPY N/A 03/10/2015   RMR: External and anal canal hemorrhoids likely source of hematochezia. Redundant colon. Single sigmoid polyp removed ad described above.    ESOPHAGOGASTRODUODENOSCOPY  06/2008   Dr. Lorrin Mais ring s/p disruption, erosive reflux esophagitis, hh.    ESOPHAGOGASTRODUODENOSCOPY  09/2011   Schatki's ring s/p dilation, multiple 1-30m antral and bulbar erosions, bx positive for H.Pylori  s/ treatment   TUBAL LIGATION     Social History   Tobacco Use   Smoking status: Never   Smokeless tobacco: Never   Tobacco comments:    quit about 35+ yrs  Vaping Use   Vaping Use: Never used  Substance Use Topics   Alcohol use: No    Alcohol/week: 0.0 standard drinks of alcohol    Drug use: No   Social History   Socioeconomic History   Marital status: Divorced    Spouse name: Not on file   Number of children: 5   Years of education: Not on file   Highest education level: Not on file  Occupational History   Occupation: retired Customer service manager  Tobacco Use   Smoking status: Never   Smokeless tobacco: Never   Tobacco comments:    quit about 35+ yrs  Vaping Use   Vaping Use: Never used  Substance and Sexual Activity   Alcohol use: No    Alcohol/week: 0.0 standard drinks of alcohol   Drug use: No   Sexual activity: Not Currently  Other Topics Concern   Not on file  Social History Narrative   Not on file   Social Determinants of Health   Financial Resource Strain: Not on file  Food Insecurity: Not on file  Transportation Needs: Not on file  Physical Activity: Not on file  Stress: Not on file  Social Connections: Not on file  Intimate Partner Violence: Not on file   Family Status  Relation Name Status   Mother  Deceased       old age   Father  Deceased at age 104   [place]  (Not Specified)   Field seismologist  (Not Specified)   Family History  Problem Relation Age of Onset   Prostate cancer Father    Colon cancer Father        >age 17   Cancer Paternal Grandmother        metastatic at time of diagnosis, primary unknown   Cancer Paternal Aunt        metastatic at time of diagnosisi, primary unknown   Allergies  Allergen Reactions   Benzalkonium Chloride Other (See Comments)    Other Reaction(s): Unknown   Neomycin-Bacitracin Zn-Polymyx     REACTION: "Severe skin rash"   Pravastatin Sodium     REACTION: "Funny feeling and leg pain"   Rosuvastatin Rash      Review of Systems  Constitutional: Negative.   HENT: Negative.    Eyes:  Negative for blurred vision.  Respiratory: Negative.  Negative for shortness of breath.   Cardiovascular: Negative.   Gastrointestinal: Negative.   Genitourinary: Negative.   Musculoskeletal: Negative.   Skin: Negative.   Negative for itching and rash.  Neurological: Negative.  Negative for headaches.  Psychiatric/Behavioral: Negative.    All other systems reviewed and are negative.     Objective:     BP (!) 147/68   Pulse 68   Temp 98.4 F (36.9 C) (Temporal)   Ht _0  (1.702 m)   Wt 157 lb 3.2 oz (71.3 kg)   SpO2 96%   BMI 24.62 kg/m  BP Readings from Last 3 Encounters:  10/15/22 (!) 147/68  07/13/22 131/70  05/07/20 (!) 198/92      Physical Exam Vitals and nursing note reviewed.  Constitutional:      Appearance: Normal appearance.  HENT:     Head: Normocephalic.     Right Ear: External ear normal.  Left Ear: External ear normal.     Nose: Nose normal.  Cardiovascular:     Rate and Rhythm: Normal rate and regular rhythm.     Pulses: Normal pulses.     Heart sounds: Normal heart sounds.  Pulmonary:     Effort: Pulmonary effort is normal.     Breath sounds: Normal breath sounds.  Abdominal:     General: Bowel sounds are normal.  Musculoskeletal:       Feet:  Feet:     Comments: Right foot bunion Skin:    General: Skin is warm.  Neurological:     General: No focal deficit present.     Mental Status: She is alert and oriented to person, place, and time.  Psychiatric:        Mood and Affect: Mood normal.        Behavior: Behavior normal.      No results found for any visits on 10/15/22.  Last CBC Lab Results  Component Value Date   WBC 8.6 03/14/2018   HGB 12.3 03/14/2018   HCT 37.6 03/14/2018   MCV 96.9 03/14/2018   MCH 31.7 03/14/2018   RDW 13.3 03/14/2018   PLT 261 21/09/7355   Last metabolic panel Lab Results  Component Value Date   GLUCOSE 140 (H) 03/14/2018   NA 137 03/14/2018   K 4.4 03/14/2018   CL 101 03/14/2018   CO2 26 03/14/2018   BUN 21 (H) 03/14/2018   CREATININE 1.13 (H) 03/14/2018   GFRNONAA 44 (L) 03/14/2018   CALCIUM 10.1 03/14/2018   PROT 7.4 02/27/2016   ALBUMIN 3.9 02/27/2016   BILITOT 1.0 02/27/2016   ALKPHOS 71 02/27/2016    AST 21 02/27/2016   ALT 15 02/27/2016   ANIONGAP 10 03/14/2018   Last lipids Lab Results  Component Value Date   CHOL 206 (H) 06/30/2009   HDL 88 06/30/2009   LDLCALC 101 (H) 06/30/2009   TRIG 87 06/30/2009   CHOLHDL 2.3 Ratio 06/30/2009   Last hemoglobin A1c Lab Results  Component Value Date   HGBA1C 6.9 (H) 07/14/2022   Last thyroid functions Lab Results  Component Value Date   TSH 0.771 03/20/2009   Last vitamin D No results found for: "25OHVITD2", "25OHVITD3", "VD25OH" Last vitamin B12 and Folate No results found for: "VITAMINB12", "FOLATE"    The ASCVD Risk score (Arnett DK, et al., 2019) failed to calculate for the following reasons:   The 2019 ASCVD risk score is only valid for ages 70 to 33    Assessment & Plan:   Problem List Items Addressed This Visit       Cardiovascular and Mediastinum   Essential hypertension (Chronic)    Patient is hypertension is not well controlled.  Patient is reporting variable numbers but did not bring a blood pressure log to clinic today.  Advised patient to continue low-sodium diet, continue exercise, incorporate vegetables and increase hydration.  Increase hydrochlorothiazide to 25 mg tablet by mouth daily.  Continue atorvastatin 5 mg tablet by mouth daily.  Take a blood pressure log twice daily sending values.  Follow-up in 4 weeks.      Relevant Medications   hydrochlorothiazide (HYDRODIURIL) 25 MG tablet   Other Relevant Orders   Bayer DCA Hb A1c Waived   Microalbumin / creatinine urine ratio   CBC with Differential/Platelet   CMP14+EGFR   Lipid panel     Endocrine   Type 2 diabetes mellitus without complication (HCC) - Primary    Type  2 diabetes well controlled on current regimen.  Completed labs CBC, CMP, lipid panel and A1c results pending.  Foot exam assessment completed, DME for diabetic shoes completed and faxed to Dr. Drucilla Chalet.  Patient will follow-up in 3 months.  Based on patient's A1c Jardiance may be  discontinued from patient's current medication.      Relevant Orders   Bayer DCA Hb A1c Waived   Microalbumin / creatinine urine ratio   CBC with Differential/Platelet   CMP14+EGFR   Lipid panel   For home use only DME Other see comment    Return in about 3 months (around 01/15/2023) for chromic disease management.    Ivy Lynn, NP

## 2022-10-15 NOTE — Telephone Encounter (Signed)
Patient needs hydrochlorothiazide (HYDRODIURIL) 25 MG tablet  sent to [facility] and canceled at [facility].

## 2022-10-15 NOTE — Assessment & Plan Note (Signed)
Type 2 diabetes well controlled on current regimen.  Completed labs CBC, CMP, lipid panel and A1c results pending.  Foot exam assessment completed, DME for diabetic shoes completed and faxed to Dr. Drucilla Chalet.  Patient will follow-up in 3 months.  Based on patient's A1c Jardiance may be discontinued from patient's current medication.

## 2022-10-16 LAB — CBC WITH DIFFERENTIAL/PLATELET
Basophils Absolute: 0 10*3/uL (ref 0.0–0.2)
Basos: 0 %
EOS (ABSOLUTE): 0.1 10*3/uL (ref 0.0–0.4)
Eos: 1 %
Hematocrit: 37.8 % (ref 34.0–46.6)
Hemoglobin: 12.4 g/dL (ref 11.1–15.9)
Immature Grans (Abs): 0 10*3/uL (ref 0.0–0.1)
Immature Granulocytes: 0 %
Lymphocytes Absolute: 2.3 10*3/uL (ref 0.7–3.1)
Lymphs: 28 %
MCH: 32.4 pg (ref 26.6–33.0)
MCHC: 32.8 g/dL (ref 31.5–35.7)
MCV: 99 fL — ABNORMAL HIGH (ref 79–97)
Monocytes Absolute: 0.5 10*3/uL (ref 0.1–0.9)
Monocytes: 5 %
Neutrophils Absolute: 5.4 10*3/uL (ref 1.4–7.0)
Neutrophils: 66 %
Platelets: 234 10*3/uL (ref 150–450)
RBC: 3.83 x10E6/uL (ref 3.77–5.28)
RDW: 14.5 % (ref 11.7–15.4)
WBC: 8.3 10*3/uL (ref 3.4–10.8)

## 2022-10-16 LAB — LIPID PANEL
Chol/HDL Ratio: 2 ratio (ref 0.0–4.4)
Cholesterol, Total: 235 mg/dL — ABNORMAL HIGH (ref 100–199)
HDL: 117 mg/dL (ref >39)
LDL Chol Calc (NIH): 105 mg/dL — ABNORMAL HIGH (ref 0–99)
Triglycerides: 79 mg/dL (ref 0–149)
VLDL Cholesterol Cal: 13 mg/dL (ref 5–40)

## 2022-10-16 LAB — CMP14+EGFR
ALT: 22 IU/L (ref 0–32)
AST: 25 IU/L (ref 0–40)
Albumin/Globulin Ratio: 1.6 (ref 1.2–2.2)
Albumin: 4.7 g/dL (ref 3.7–4.7)
Alkaline Phosphatase: 89 IU/L (ref 44–121)
BUN/Creatinine Ratio: 15 (ref 12–28)
BUN: 19 mg/dL (ref 8–27)
Bilirubin Total: 0.4 mg/dL (ref 0.0–1.2)
CO2: 24 mmol/L (ref 20–29)
Calcium: 10.7 mg/dL — ABNORMAL HIGH (ref 8.7–10.3)
Chloride: 101 mmol/L (ref 96–106)
Creatinine, Ser: 1.25 mg/dL — ABNORMAL HIGH (ref 0.57–1.00)
Globulin, Total: 2.9 g/dL (ref 1.5–4.5)
Glucose: 136 mg/dL — ABNORMAL HIGH (ref 70–99)
Potassium: 4.4 mmol/L (ref 3.5–5.2)
Sodium: 140 mmol/L (ref 134–144)
Total Protein: 7.6 g/dL (ref 6.0–8.5)
eGFR: 41 mL/min/{1.73_m2} — ABNORMAL LOW (ref >59)

## 2022-10-16 LAB — MICROALBUMIN / CREATININE URINE RATIO
Creatinine, Urine: 30.8 mg/dL
Microalb/Creat Ratio: 64 mg/g creat — ABNORMAL HIGH (ref 0–29)
Microalbumin, Urine: 19.6 ug/mL

## 2022-10-18 NOTE — Telephone Encounter (Signed)
Patient calling with questions about meds. Please call back

## 2022-10-18 NOTE — Telephone Encounter (Signed)
Spoke with pt , no other concerns

## 2022-10-19 DIAGNOSIS — E1142 Type 2 diabetes mellitus with diabetic polyneuropathy: Secondary | ICD-10-CM | POA: Diagnosis not present

## 2022-10-19 DIAGNOSIS — B351 Tinea unguium: Secondary | ICD-10-CM | POA: Diagnosis not present

## 2022-10-19 DIAGNOSIS — M79676 Pain in unspecified toe(s): Secondary | ICD-10-CM | POA: Diagnosis not present

## 2022-10-19 DIAGNOSIS — L84 Corns and callosities: Secondary | ICD-10-CM | POA: Diagnosis not present

## 2022-10-19 NOTE — Progress Notes (Signed)
Diabetic shoe order & notes faxed to Dr Steffanie Rainwater at [phone]

## 2022-11-02 DIAGNOSIS — Z947 Corneal transplant status: Secondary | ICD-10-CM | POA: Diagnosis not present

## 2022-11-12 DIAGNOSIS — H401131 Primary open-angle glaucoma, bilateral, mild stage: Secondary | ICD-10-CM | POA: Diagnosis not present

## 2022-11-12 DIAGNOSIS — Z947 Corneal transplant status: Secondary | ICD-10-CM | POA: Diagnosis not present

## 2022-11-22 DIAGNOSIS — E119 Type 2 diabetes mellitus without complications: Secondary | ICD-10-CM | POA: Diagnosis not present

## 2022-11-22 DIAGNOSIS — S199XXA Unspecified injury of neck, initial encounter: Secondary | ICD-10-CM | POA: Diagnosis not present

## 2022-11-22 DIAGNOSIS — S0990XA Unspecified injury of head, initial encounter: Secondary | ICD-10-CM | POA: Diagnosis not present

## 2022-11-22 DIAGNOSIS — I10 Essential (primary) hypertension: Secondary | ICD-10-CM | POA: Diagnosis not present

## 2022-11-22 DIAGNOSIS — S60222A Contusion of left hand, initial encounter: Secondary | ICD-10-CM | POA: Diagnosis not present

## 2022-11-22 DIAGNOSIS — R519 Headache, unspecified: Secondary | ICD-10-CM | POA: Diagnosis not present

## 2022-11-22 DIAGNOSIS — Z7984 Long term (current) use of oral hypoglycemic drugs: Secondary | ICD-10-CM | POA: Diagnosis not present

## 2022-11-22 DIAGNOSIS — Z79899 Other long term (current) drug therapy: Secondary | ICD-10-CM | POA: Diagnosis not present

## 2022-11-22 DIAGNOSIS — Z7982 Long term (current) use of aspirin: Secondary | ICD-10-CM | POA: Diagnosis not present

## 2022-11-22 DIAGNOSIS — S0101XA Laceration without foreign body of scalp, initial encounter: Secondary | ICD-10-CM | POA: Diagnosis not present

## 2022-11-22 DIAGNOSIS — S61412A Laceration without foreign body of left hand, initial encounter: Secondary | ICD-10-CM | POA: Diagnosis not present

## 2022-11-22 DIAGNOSIS — W182XXA Fall in (into) shower or empty bathtub, initial encounter: Secondary | ICD-10-CM | POA: Diagnosis not present

## 2022-11-23 ENCOUNTER — Telehealth: Payer: Self-pay

## 2022-11-23 NOTE — Telephone Encounter (Signed)
Transition Care Management Follow-up Telephone Call Date of discharge and from where: 11/22/2022  [facility]  How have you been since you were released from the hospital? Better  Any questions or concerns? No  Items Reviewed: Did the pt receive and understand the discharge instructions provided? Yes  Medications obtained and verified? Yes  Other? No  Any new allergies since your discharge? No  Dietary orders reviewed? Yes Do you have support at home? Yes   Home Care and Equipment/Supplies: Were home health services ordered? no If so, what is the name of the agency? N/a  Has the agency set up a time to come to the patient's home? not applicable Were any new equipment or medical supplies ordered?  No What is the name of the medical supply agency? N/a Were you able to get the supplies/equipment? not applicable Do you have any questions related to the use of the equipment or supplies? No  Functional Questionnaire: (I = Independent and D = Dependent) ADLs: i  Bathing/Dressing- i  Meal Prep- i  Eating- i  Maintaining continence- i  Transferring/Ambulation- i  Managing Meds- i  Follow up appointments reviewed:  PCP Hospital f/u appt confirmed? Yes  Scheduled to see Candice Brewer on 11/30/2022 @ 230pm. [facility] f/u appt confirmed? No  Scheduled to see N/A  Are transportation arrangements needed? No  If their condition worsens, is the pt aware to call PCP or go to the Emergency Dept.? Yes Was the patient provided with contact information for the PCP's office or ED? Yes Was to pt encouraged to call back with questions or concerns? Yes

## 2022-11-30 ENCOUNTER — Encounter: Payer: Self-pay | Admitting: Nurse Practitioner

## 2022-11-30 ENCOUNTER — Ambulatory Visit (INDEPENDENT_AMBULATORY_CARE_PROVIDER_SITE_OTHER): Payer: Medicare Other | Admitting: Nurse Practitioner

## 2022-11-30 VITALS — BP 130/75 | HR 67 | Temp 98.7°F | Ht 67.0 in | Wt 155.8 lb

## 2022-11-30 DIAGNOSIS — S0101XA Laceration without foreign body of scalp, initial encounter: Secondary | ICD-10-CM

## 2022-11-30 DIAGNOSIS — W19XXXA Unspecified fall, initial encounter: Secondary | ICD-10-CM | POA: Diagnosis not present

## 2022-11-30 DIAGNOSIS — Z4802 Encounter for removal of sutures: Secondary | ICD-10-CM | POA: Diagnosis not present

## 2022-11-30 NOTE — Patient Instructions (Signed)

## 2022-11-30 NOTE — Progress Notes (Signed)
   Acute Office Visit  Subjective:     Patient ID: Candice Brewer, female    DOB: Apr 24, 1932, 89 y.o.   MRN: [id number]  Chief Complaint  Patient presents with   Hospitalization Follow-up    Pt states she feels pretty good , she said she is not hurting     HPI Patient is in today for follow up of falls and staple removal. Patient had 12 staples placed on her horizontally on the occipital bone 8 days ago. Wound is healing as expected with no infection. Patient denies fever, or  worsening pain  Review of Systems  Constitutional: Negative.  Negative for chills and fever.  HENT: Negative.    Respiratory: Negative.    Cardiovascular: Negative.   Genitourinary: Negative.   Skin: Negative.  Negative for itching and rash.  All other systems reviewed and are negative.       Objective:    BP 130/75   Pulse 67   Temp 98.7 F (37.1 C)   Ht '5\' 7"'$  (1.702 m)   Wt 155 lb 12.8 oz (70.7 kg)   SpO2 98%   BMI 24.40 kg/m  BP Readings from Last 3 Encounters:  11/30/22 130/75  10/15/22 (!) 147/68  07/13/22 131/70   Wt Readings from Last 3 Encounters:  11/30/22 155 lb 12.8 oz (70.7 kg)  10/15/22 157 lb 3.2 oz (71.3 kg)  07/13/22 158 lb (71.7 kg)      Physical Exam Vitals and nursing note reviewed. Exam conducted with a chaperone present (Daughter).  Constitutional:      Appearance: Normal appearance.  HENT:     Head: Normocephalic. Laceration present. No raccoon eyes.      Comments: Healing wound on occipital bone with 12 staples    Right Ear: External ear normal.     Left Ear: External ear normal.     Nose: Nose normal.  Cardiovascular:     Rate and Rhythm: Normal rate.     Heart sounds: Normal heart sounds.  Pulmonary:     Effort: Pulmonary effort is normal.     Breath sounds: Normal breath sounds.  Musculoskeletal:        General: Normal range of motion.  Skin:    General: Skin is warm.  Neurological:     General: No focal deficit present.     Mental Status: She is  alert and oriented to person, place, and time.  Psychiatric:        Mood and Affect: Mood normal.        Behavior: Behavior normal.     No results found for any visits on 11/30/22.      Assessment & Plan:  Staples removed, patient tolerated well. Wound is healing as expected.  Patient will need to schedule a medicare annual wellness exam.  Problem List Items Addressed This Visit   None Visit Diagnoses     Encounter for staple removal    -  Primary       No orders of the defined types were placed in this encounter.   Return if symptoms worsen or fail to improve.  Ivy Lynn, NP

## 2022-12-21 DIAGNOSIS — Z947 Corneal transplant status: Secondary | ICD-10-CM | POA: Diagnosis not present

## 2022-12-21 DIAGNOSIS — H401131 Primary open-angle glaucoma, bilateral, mild stage: Secondary | ICD-10-CM | POA: Diagnosis not present

## 2022-12-21 LAB — HM DIABETES EYE EXAM

## 2022-12-28 DIAGNOSIS — E1142 Type 2 diabetes mellitus with diabetic polyneuropathy: Secondary | ICD-10-CM | POA: Diagnosis not present

## 2022-12-28 DIAGNOSIS — L84 Corns and callosities: Secondary | ICD-10-CM | POA: Diagnosis not present

## 2022-12-28 DIAGNOSIS — B351 Tinea unguium: Secondary | ICD-10-CM | POA: Diagnosis not present

## 2022-12-28 DIAGNOSIS — M79676 Pain in unspecified toe(s): Secondary | ICD-10-CM | POA: Diagnosis not present

## 2023-01-18 ENCOUNTER — Ambulatory Visit: Payer: TRICARE For Life (TFL) | Admitting: Nurse Practitioner

## 2023-01-19 ENCOUNTER — Encounter: Payer: Self-pay | Admitting: Family Medicine

## 2023-01-19 ENCOUNTER — Ambulatory Visit: Payer: TRICARE For Life (TFL) | Admitting: Family Medicine

## 2023-01-19 ENCOUNTER — Ambulatory Visit (INDEPENDENT_AMBULATORY_CARE_PROVIDER_SITE_OTHER): Payer: 59 | Admitting: Family Medicine

## 2023-01-19 VITALS — BP 163/89 | HR 67 | Temp 97.5°F | Ht 67.0 in | Wt 155.2 lb

## 2023-01-19 DIAGNOSIS — E1169 Type 2 diabetes mellitus with other specified complication: Secondary | ICD-10-CM | POA: Diagnosis not present

## 2023-01-19 DIAGNOSIS — E119 Type 2 diabetes mellitus without complications: Secondary | ICD-10-CM | POA: Diagnosis not present

## 2023-01-19 DIAGNOSIS — E785 Hyperlipidemia, unspecified: Secondary | ICD-10-CM | POA: Diagnosis not present

## 2023-01-19 DIAGNOSIS — I10 Essential (primary) hypertension: Secondary | ICD-10-CM

## 2023-01-19 DIAGNOSIS — M159 Polyosteoarthritis, unspecified: Secondary | ICD-10-CM

## 2023-01-19 LAB — BAYER DCA HB A1C WAIVED: HB A1C (BAYER DCA - WAIVED): 7.9 % — ABNORMAL HIGH (ref 4.8–5.6)

## 2023-01-19 MED ORDER — JANUMET 50-1000 MG PO TABS: 1.0000 | ORAL_TABLET | Freq: Two times a day (BID) | ORAL | 2 refills | Status: DC

## 2023-01-19 MED ORDER — DICLOFENAC SODIUM 3 % EX GEL: 1.0000 | Freq: Every day | CUTANEOUS | 6 refills | Status: DC

## 2023-01-19 NOTE — Patient Instructions (Addendum)

## 2023-01-19 NOTE — Progress Notes (Signed)
Subjective:  Patient ID: Candice Brewer, female    DOB: 08/23/1932, 89 y.o.   MRN: [id number]  Patient Care Team: Baruch Gouty, FNP as PCP - General (Family Medicine)   Chief Complaint:  Establish Care ([facility] patient ) and Medical Management of Chronic Issues   HPI: Candice Brewer is a 89 y.o. female presenting on 01/19/2023 for Establish Care ([place] patient ) and Medical Management of Chronic Issues  Pt presents today to establish care with new PCP and for management of chronic medical conditions.   1. Type 2 diabetes mellitus without complication, without long-term current use of insulin (HCC) Currently on Janumet and tolerating well. Denies adverse side effects. No polyuria, polydipsia, or polyphagia.   2. Hypertension associated with diabetes On ACEi and HCTZ. Tolerating well. Does watch sodium intake and eats a healthy diet. No chest pain, leg swelling, weakness, confusion, weakness, or headaches.   3. Primary osteoarthritis involving multiple joints Has been using Voltaren gel which is working well. No new or worsening symptoms.    4. Hyperlipidemia associated with diabetes Not on statin therapy. Does follow a healthy diet and is active daily.   Relevant past medical, surgical, family, and social history reviewed and updated as indicated.  Allergies and medications reviewed and updated. Data reviewed: Chart in Epic.   Past Medical History:  Diagnosis Date   Arthritis    Chest pain, unspecified    Constipation    DM (diabetes mellitus) ([place])    Dry eyes    Esophageal stricture 09/2011   schatzki's ring   Fibroids    uterus   Glaucoma    Helicobacter pylori gastritis 2012   Treated   HTN (hypertension)    Hyperlipidemia    Restless legs syndrome (RLS)    Vitamin D deficiency     Past Surgical History:  Procedure Laterality Date   APPENDECTOMY     BREAST BIOPSY Left 03/20/2018   Procedure: BREAST BIOPSY WITH NEEDLE LOCALIZATION;  Surgeon: Virl Cagey,  MD;  Location: [facility];  Service: General;  Laterality: Left;   BREAST EXCISIONAL BIOPSY Left 2019   Atypical hyperplasia removed   CARDIOVASCULAR STRESS TEST  2009   CATARACT EXTRACTION  2010   right eye   CATARACT EXTRACTION  2012   left eye   COLONOSCOPY  08/2006   Dr. Vivi Ferns   COLONOSCOPY  10/13/2011   Normal. Next TCS 09/2016.   Procedure: COLONOSCOPY;  Surgeon: Daneil Dolin, MD;  Location: [facility];  Service: Endoscopy;  Laterality: N/A;  8:15   COLONOSCOPY N/A 03/10/2015   RMR: External and anal canal hemorrhoids likely source of hematochezia. Redundant colon. Single sigmoid polyp removed ad described above.    ESOPHAGOGASTRODUODENOSCOPY  06/2008   Dr. Lorrin Mais ring s/p disruption, erosive reflux esophagitis, hh.    ESOPHAGOGASTRODUODENOSCOPY  09/2011   Schatki's ring s/p dilation, multiple 1-39m antral and bulbar erosions, bx positive for H.Pylori s/ treatment   TUBAL LIGATION      Social History   Socioeconomic History   Marital status: Divorced    Spouse name: Not on file   Number of children: 5   Years of education: Not on file   Highest education level: Not on file  Occupational History   Occupation: retired bCustomer service manager Tobacco Use   Smoking status: Never   Smokeless tobacco: Never   Tobacco comments:    quit about 35+ yrs  Vaping Use   Vaping Use: Never used  Substance and Sexual Activity   Alcohol use: No    Alcohol/week: 0.0 standard drinks of alcohol   Drug use: No   Sexual activity: Not Currently  Other Topics Concern   Not on file  Social History Narrative   Not on file   Social Determinants of Health   Financial Resource Strain: Not on file  Food Insecurity: Not on file  Transportation Needs: Not on file  Physical Activity: Not on file  Stress: Not on file  Social Connections: Not on file  Intimate Partner Violence: Not on file    Outpatient Encounter Medications as of 01/19/2023  Medication Sig   aspirin EC 81 MG tablet  Take 81 mg by mouth daily at 2 PM.   brimonidine-timolol (COMBIGAN) 0.2-0.5 % ophthalmic solution Place 1 drop into both eyes 2 (two) times daily.    cholecalciferol (VITAMIN D) 1000 units tablet Take 1,000 Units by mouth daily.   diclofenac sodium (VOLTAREN) 1 % GEL Apply 1-2 g topically 4 (four) times daily as needed (for itchy skin).   Diclofenac Sodium 3 % GEL Apply 1 Application topically daily.   enalapril (VASOTEC) 20 MG tablet Take 20 mg by mouth daily.     hydrochlorothiazide (HYDRODIURIL) 25 MG tablet Take 1 tablet (25 mg total) by mouth daily.   latanoprost (XALATAN) 0.005 % ophthalmic solution Place 1 drop into both eyes at bedtime.    Multiple Vitamin (MULTIVITAMIN WITH MINERALS) TABS tablet Take 1 tablet by mouth daily.   valACYclovir (VALTREX) 1000 MG tablet Take 1,000 mg by mouth 2 (two) times daily.   [DISCONTINUED] sitaGLIPtin-metformin (JANUMET) 50-1000 MG tablet Take 1 tablet by mouth 2 (two) times daily with a meal.   sitaGLIPtin-metformin (JANUMET) 50-1000 MG tablet Take 1 tablet by mouth 2 (two) times daily with a meal.   [DISCONTINUED] amLODipine (NORVASC) 5 MG tablet Take 1 tablet (5 mg total) by mouth daily.   No facility-administered encounter medications on file as of 01/19/2023.    Allergies  Allergen Reactions   Benzalkonium Chloride Other (See Comments)    Other Reaction(s): Unknown   Benzalkonium Other (See Comments)   Neomycin-Bacitracin Zn-Polymyx     REACTION: "Severe skin rash"   Pravastatin Sodium     REACTION: "Funny feeling and leg pain"   Neomy-Bacit-Polymyx-Pramoxine Rash   Rosuvastatin Rash    Review of Systems  Constitutional:  Negative for activity change, appetite change, chills, diaphoresis, fatigue, fever and unexpected weight change.  HENT: Negative.    Eyes: Negative.  Negative for photophobia and visual disturbance.  Respiratory:  Negative for cough, chest tightness and shortness of breath.   Cardiovascular:  Negative for chest pain,  palpitations and leg swelling.  Gastrointestinal:  Negative for abdominal pain, blood in stool, constipation, diarrhea, nausea and vomiting.  Endocrine: Negative.  Negative for polydipsia, polyphagia and polyuria.  Genitourinary:  Negative for decreased urine volume, difficulty urinating, dysuria, frequency and urgency.  Musculoskeletal:  Positive for arthralgias. Negative for myalgias.  Skin: Negative.   Allergic/Immunologic: Negative.   Neurological:  Negative for dizziness, tremors, seizures, syncope, facial asymmetry, speech difficulty, weakness, light-headedness, numbness and headaches.  Hematological: Negative.   Psychiatric/Behavioral:  Negative for confusion, hallucinations, sleep disturbance and suicidal ideas.   All other systems reviewed and are negative.       Objective:  BP (!) 163/89   Pulse 67   Temp (!) 97.5 F (36.4 C) (Temporal)   Ht 5' 7"$  (1.702 m)   Wt 155 lb 3.2 oz (70.4 kg)  SpO2 100%   BMI 24.31 kg/m    Wt Readings from Last 3 Encounters:  01/19/23 155 lb 3.2 oz (70.4 kg)  11/30/22 155 lb 12.8 oz (70.7 kg)  10/15/22 157 lb 3.2 oz (71.3 kg)    Physical Exam Vitals and nursing note reviewed.  Constitutional:      General: She is not in acute distress.    Appearance: Normal appearance. She is well-developed, well-groomed and normal weight. She is not ill-appearing, toxic-appearing or diaphoretic.  HENT:     Head: Normocephalic and atraumatic.     Jaw: There is normal jaw occlusion.     Right Ear: Hearing normal.     Left Ear: Hearing normal.     Nose: Nose normal.     Mouth/Throat:     Lips: Pink.     Mouth: Mucous membranes are moist.     Pharynx: Oropharynx is clear. Uvula midline.  Eyes:     General: Lids are normal.     Extraocular Movements: Extraocular movements intact.     Conjunctiva/sclera: Conjunctivae normal.     Pupils: Pupils are equal, round, and reactive to light.  Neck:     Thyroid: No thyroid mass, thyromegaly or thyroid  tenderness.     Vascular: No carotid bruit or JVD.     Trachea: Trachea and phonation normal.  Cardiovascular:     Rate and Rhythm: Normal rate and regular rhythm.     Chest Wall: PMI is not displaced.     Pulses: Normal pulses.          Dorsalis pedis pulses are 2+ on the right side and 2+ on the left side.       Posterior tibial pulses are 2+ on the right side and 2+ on the left side.     Heart sounds: Normal heart sounds. No murmur heard.    No friction rub. No gallop.  Pulmonary:     Effort: Pulmonary effort is normal. No respiratory distress.     Breath sounds: Normal breath sounds. No wheezing.  Abdominal:     General: Bowel sounds are normal. There is no distension or abdominal bruit.     Palpations: Abdomen is soft. There is no hepatomegaly or splenomegaly.     Tenderness: There is no abdominal tenderness. There is no right CVA tenderness or left CVA tenderness.     Hernia: No hernia is present.  Musculoskeletal:     Left wrist: Normal.     Left hand: Tenderness present. No swelling, deformity, lacerations or bony tenderness. Decreased range of motion. Normal strength. Normal sensation. There is no disruption of two-point discrimination. Normal capillary refill. Normal pulse.     Cervical back: Normal range of motion and neck supple.     Right lower leg: No edema.     Left lower leg: No edema.     Right foot: Bunion present.     Left foot: Bunion present.  Feet:     Right foot:     Protective Sensation: 10 sites tested.  10 sites sensed.     Skin integrity: Skin integrity normal.     Left foot:     Protective Sensation: 10 sites tested.  10 sites sensed.     Skin integrity: Skin integrity normal.  Lymphadenopathy:     Cervical: No cervical adenopathy.  Skin:    General: Skin is warm and dry.     Capillary Refill: Capillary refill takes less than 2 seconds.     Coloration:  Skin is not cyanotic, jaundiced or pale.     Findings: No rash.  Neurological:     General: No  focal deficit present.     Mental Status: She is alert and oriented to person, place, and time.     Sensory: Sensation is intact.     Motor: Motor function is intact.     Coordination: Coordination is intact.     Gait: Gait is intact.     Deep Tendon Reflexes: Reflexes are normal and symmetric.  Psychiatric:        Attention and Perception: Attention and perception normal.        Mood and Affect: Mood and affect normal.        Speech: Speech normal.        Behavior: Behavior normal. Behavior is cooperative.        Thought Content: Thought content normal.        Cognition and Memory: Cognition and memory normal.        Judgment: Judgment normal.     Results for orders placed or performed in visit on 01/19/23  Bayer DCA Hb A1c Waived  Result Value Ref Range   HB A1C (BAYER DCA - WAIVED) 7.9 (H) 4.8 - 5.6 %       Pertinent labs & imaging results that were available during my care of the patient were reviewed by me and considered in my medical decision making.  Assessment & Plan:  Kareemah was seen today for establish care and medical management of chronic issues.  Diagnoses and all orders for this visit:  Type 2 diabetes mellitus without complication, without long-term current use of insulin (HCC) A1C 7.9, discussed diet and exercise in detail. No changes today, aware if A1C remains above 7.5 at next visit, will adjust regimen. Pt agrees to plan.  -     Lipid panel -     CBC with Differential/Platelet -     CMP14+EGFR -     Bayer DCA Hb A1c Waived -     sitaGLIPtin-metformin (JANUMET) 50-1000 MG tablet; Take 1 tablet by mouth 2 (two) times daily with a meal. -     For home use only DME Other see comment  Essential hypertension associated with diabetes Not well controlled today. Discussed diet and exercise in detail. Aware that if BP remains elevated at next visit, will adjust regimen. DASH diet encouraged. Handout provided.  -     Lipid panel -     CBC with  Differential/Platelet -     CMP14+EGFR  Primary osteoarthritis involving multiple joints Does well with below. Will continue.  -     Diclofenac Sodium 3 % GEL; Apply 1 Application topically daily.  Hyperlipidemia associated with type 2 diabetes mellitus ([place]) Not on statin therapy. Does follow a healthy diet and is active on a daily basis. Labs pending.  -     Lipid panel -     CMP14+EGFR     Continue all other maintenance medications.  Follow up plan: Return in 3 months (on 04/19/2023), or if symptoms worsen or fail to improve, for schedule AWV, DM.   Continue healthy lifestyle choices, including diet (rich in fruits, vegetables, and lean proteins, and low in salt and simple carbohydrates) and exercise (at least 30 minutes of moderate physical activity daily).  Educational handout given for DM, meal planning guide  The above assessment and management plan was discussed with the patient. The patient verbalized understanding of and has agreed to the  management plan. Patient is aware to call the clinic if they develop any new symptoms or if symptoms persist or worsen. Patient is aware when to return to the clinic for a follow-up visit. Patient educated on when it is appropriate to go to the emergency department.   Monia Pouch, FNP-C [facility] [phone]

## 2023-01-20 LAB — CMP14+EGFR
ALT: 16 IU/L (ref 0–32)
AST: 19 IU/L (ref 0–40)
Albumin/Globulin Ratio: 1.8 (ref 1.2–2.2)
Albumin: 4.5 g/dL (ref 3.7–4.7)
Alkaline Phosphatase: 92 IU/L (ref 44–121)
BUN/Creatinine Ratio: 20 (ref 12–28)
BUN: 33 mg/dL — ABNORMAL HIGH (ref 8–27)
Bilirubin Total: 0.4 mg/dL (ref 0.0–1.2)
CO2: 22 mmol/L (ref 20–29)
Calcium: 9.9 mg/dL (ref 8.7–10.3)
Chloride: 102 mmol/L (ref 96–106)
Creatinine, Ser: 1.62 mg/dL — ABNORMAL HIGH (ref 0.57–1.00)
Globulin, Total: 2.5 g/dL (ref 1.5–4.5)
Glucose: 173 mg/dL — ABNORMAL HIGH (ref 70–99)
Potassium: 4.6 mmol/L (ref 3.5–5.2)
Sodium: 137 mmol/L (ref 134–144)
Total Protein: 7 g/dL (ref 6.0–8.5)
eGFR: 30 mL/min/{1.73_m2} — ABNORMAL LOW (ref >59)

## 2023-01-20 LAB — CBC WITH DIFFERENTIAL/PLATELET
Basophils Absolute: 0.1 10*3/uL (ref 0.0–0.2)
Basos: 1 %
EOS (ABSOLUTE): 0.1 10*3/uL (ref 0.0–0.4)
Eos: 1 %
Hematocrit: 35.1 % (ref 34.0–46.6)
Hemoglobin: 11.8 g/dL (ref 11.1–15.9)
Immature Grans (Abs): 0 10*3/uL (ref 0.0–0.1)
Immature Granulocytes: 0 %
Lymphocytes Absolute: 2.2 10*3/uL (ref 0.7–3.1)
Lymphs: 24 %
MCH: 36.3 pg — ABNORMAL HIGH (ref 26.6–33.0)
MCHC: 33.6 g/dL (ref 31.5–35.7)
MCV: 108 fL — ABNORMAL HIGH (ref 79–97)
Monocytes Absolute: 0.5 10*3/uL (ref 0.1–0.9)
Monocytes: 5 %
Neutrophils Absolute: 6.2 10*3/uL (ref 1.4–7.0)
Neutrophils: 69 %
Platelets: 224 10*3/uL (ref 150–450)
RBC: 3.25 x10E6/uL — ABNORMAL LOW (ref 3.77–5.28)
RDW: 13.6 % (ref 11.7–15.4)
WBC: 9 10*3/uL (ref 3.4–10.8)

## 2023-01-20 LAB — LIPID PANEL
Chol/HDL Ratio: 2.2 ratio (ref 0.0–4.4)
Cholesterol, Total: 206 mg/dL — ABNORMAL HIGH (ref 100–199)
HDL: 95 mg/dL (ref >39)
LDL Chol Calc (NIH): 96 mg/dL (ref 0–99)
Triglycerides: 88 mg/dL (ref 0–149)
VLDL Cholesterol Cal: 15 mg/dL (ref 5–40)

## 2023-01-21 ENCOUNTER — Telehealth: Payer: Self-pay | Admitting: Family Medicine

## 2023-01-21 NOTE — Telephone Encounter (Signed)
Please resend Diabetic shoe order to [facility]. They are telling pt that they have not received the order.

## 2023-01-21 NOTE — Telephone Encounter (Signed)
Re faxed and got conformation

## 2023-01-25 ENCOUNTER — Telehealth: Payer: Self-pay

## 2023-01-25 DIAGNOSIS — E119 Type 2 diabetes mellitus without complications: Secondary | ICD-10-CM | POA: Diagnosis not present

## 2023-01-25 DIAGNOSIS — I10 Essential (primary) hypertension: Secondary | ICD-10-CM | POA: Diagnosis not present

## 2023-01-25 NOTE — Telephone Encounter (Signed)
Candice Brewer (Candice Brewer) PA Case ID #[id number] Rx #: [id number] Need Help? Call us at [phone] Status sent iconSent to Plan today Drug Diclofenac Sodium 3% gel ePA cloud logo Form [employer] Medicare Part D Electronic Prior Authorization Form [phone] NCPDP)

## 2023-01-28 NOTE — Telephone Encounter (Signed)
Patient aware and verbalizes understanding. 

## 2023-01-28 NOTE — Telephone Encounter (Signed)
Pharmacy Patient Advocate Encounter  Received notification from [employer] [facility] that the request for prior authorization for Diclofenac Gel has been denied due to .    Please be advised we currently do not have a Pharmacist to review denials, therefore you will need to process appeals accordingly as needed. Thanks for your support at this time.   You may call [phone] to appeal.

## 2023-01-31 ENCOUNTER — Telehealth: Payer: Self-pay | Admitting: Family Medicine

## 2023-01-31 NOTE — Telephone Encounter (Signed)
Contacted Candice Brewer to schedule their annual wellness visit. Appointment made for 02/14/2023.  Thank you,  Colletta Maryland,  [place] [facility] Direct Dial ??[phone]

## 2023-02-14 ENCOUNTER — Telehealth: Payer: Self-pay | Admitting: Family Medicine

## 2023-02-14 NOTE — Telephone Encounter (Signed)
Contacted Shelbi L Gick to schedule their annual wellness visit. Appointment made for 02/15/2023.  Left message with new appt date and time due to [facility] out of the office 02/14/2023.  Thank you,  Colletta Maryland,  [place] Program Direct Dial ??[phone]

## 2023-02-15 ENCOUNTER — Ambulatory Visit (INDEPENDENT_AMBULATORY_CARE_PROVIDER_SITE_OTHER): Payer: 59

## 2023-02-15 VITALS — Ht 67.0 in | Wt 152.0 lb

## 2023-02-15 DIAGNOSIS — Z0000 Encounter for general adult medical examination without abnormal findings: Secondary | ICD-10-CM | POA: Diagnosis not present

## 2023-02-15 NOTE — Patient Instructions (Signed)
Candice Brewer , Thank you for taking time to come for your Medicare Wellness Visit. I appreciate your ongoing commitment to your health goals. Please review the following plan we discussed and let me know if I can assist you in the future.   These are the goals we discussed:  Goals      DIET - INCREASE WATER INTAKE        This is a list of the screening recommended for you and due dates:  Health Maintenance  Topic Date Due   DTaP/Tdap/Td vaccine (1 - Tdap) Never done   Zoster (Shingles) Vaccine (1 of 2) Never done   COVID-19 Vaccine (5 - 2023-24 season) 12/14/2022   Hemoglobin A1C  07/20/2023   Eye exam for diabetics  12/22/2023   Complete foot exam   01/20/2024   Medicare Annual Wellness Visit  02/15/2024   Pneumonia Vaccine  Completed   Flu Shot  Completed   DEXA scan (bone density measurement)  Completed   HPV Vaccine  Aged Out    Advanced directives: Advance directive discussed with you today. I have provided a copy for you to complete at home and have notarized. Once this is complete please bring a copy in to our office so we can scan it into your chart.   Conditions/risks identified: Aim for 30 minutes of exercise or brisk walking, 6-8 glasses of water, and 5 servings of fruits and vegetables each day.   Next appointment: Follow up in one year for your annual wellness visit    Preventive Care 65 Years and Older, Female Preventive care refers to lifestyle choices and visits with your health care provider that can promote health and wellness. What does preventive care include? A yearly physical exam. This is also called an annual well check. Dental exams once or twice a year. Routine eye exams. Ask your health care provider how often you should have your eyes checked. Personal lifestyle choices, including: Daily care of your teeth and gums. Regular physical activity. Eating a healthy diet. Avoiding tobacco and drug use. Limiting alcohol use. Practicing safe sex. Taking  low-dose aspirin every day. Taking vitamin and mineral supplements as recommended by your health care provider. What happens during an annual well check? The services and screenings done by your health care provider during your annual well check will depend on your age, overall health, lifestyle risk factors, and family history of disease. Counseling  Your health care provider may ask you questions about your: Alcohol use. Tobacco use. Drug use. Emotional well-being. Home and relationship well-being. Sexual activity. Eating habits. History of falls. Memory and ability to understand (cognition). Work and work Statistician. Reproductive health. Screening  You may have the following tests or measurements: Height, weight, and BMI. Blood pressure. Lipid and cholesterol levels. These may be checked every 5 years, or more frequently if you are over 62 years old. Skin check. Lung cancer screening. You may have this screening every year starting at age 35 if you have a 30-pack-year history of smoking and currently smoke or have quit within the past 15 years. Fecal occult blood test (FOBT) of the stool. You may have this test every year starting at age 28. Flexible sigmoidoscopy or colonoscopy. You may have a sigmoidoscopy every 5 years or a colonoscopy every 10 years starting at age 2. Hepatitis C blood test. Hepatitis B blood test. Sexually transmitted disease (STD) testing. Diabetes screening. This is done by checking your blood sugar (glucose) after you have not eaten for  a while (fasting). You may have this done every 1-3 years. Bone density scan. This is done to screen for osteoporosis. You may have this done starting at age 49. Mammogram. This may be done every 1-2 years. Talk to your health care provider about how often you should have regular mammograms. Talk with your health care provider about your test results, treatment options, and if necessary, the need for more tests. Vaccines   Your health care provider may recommend certain vaccines, such as: Influenza vaccine. This is recommended every year. Tetanus, diphtheria, and acellular pertussis (Tdap, Td) vaccine. You may need a Td booster every 10 years. Zoster vaccine. You may need this after age 73. Pneumococcal 13-valent conjugate (PCV13) vaccine. One dose is recommended after age 14. Pneumococcal polysaccharide (PPSV23) vaccine. One dose is recommended after age 20. Talk to your health care provider about which screenings and vaccines you need and how often you need them. This information is not intended to replace advice given to you by your health care provider. Make sure you discuss any questions you have with your health care provider. Document Released: 12/12/2015 Document Revised: 08/04/2016 Document Reviewed: 09/16/2015 Elsevier Interactive Patient Education  2017 [place] Prevention in the Home Falls can cause injuries. They can happen to people of all ages. There are many things you can do to make your home safe and to help prevent falls. What can I do on the outside of my home? Regularly fix the edges of walkways and driveways and fix any cracks. Remove anything that might make you trip as you walk through a door, such as a raised step or threshold. Trim any bushes or trees on the path to your home. Use bright outdoor lighting. Clear any walking paths of anything that might make someone trip, such as rocks or tools. Regularly check to see if handrails are loose or broken. Make sure that both sides of any steps have handrails. Any raised decks and porches should have guardrails on the edges. Have any leaves, snow, or ice cleared regularly. Use sand or salt on walking paths during winter. Clean up any spills in your garage right away. This includes oil or grease spills. What can I do in the bathroom? Use night lights. Install grab bars by the toilet and in the tub and shower. Do not use towel  bars as grab bars. Use non-skid mats or decals in the tub or shower. If you need to sit down in the shower, use a plastic, non-slip stool. Keep the floor dry. Clean up any water that spills on the floor as soon as it happens. Remove soap buildup in the tub or shower regularly. Attach bath mats securely with double-sided non-slip rug tape. Do not have throw rugs and other things on the floor that can make you trip. What can I do in the bedroom? Use night lights. Make sure that you have a light by your bed that is easy to reach. Do not use any sheets or blankets that are too big for your bed. They should not hang down onto the floor. Have a firm chair that has side arms. You can use this for support while you get dressed. Do not have throw rugs and other things on the floor that can make you trip. What can I do in the kitchen? Clean up any spills right away. Avoid walking on wet floors. Keep items that you use a lot in easy-to-reach places. If you need to reach something above  you, use a strong step stool that has a grab bar. Keep electrical cords out of the way. Do not use floor polish or wax that makes floors slippery. If you must use wax, use non-skid floor wax. Do not have throw rugs and other things on the floor that can make you trip. What can I do with my stairs? Do not leave any items on the stairs. Make sure that there are handrails on both sides of the stairs and use them. Fix handrails that are broken or loose. Make sure that handrails are as long as the stairways. Check any carpeting to make sure that it is firmly attached to the stairs. Fix any carpet that is loose or worn. Avoid having throw rugs at the top or bottom of the stairs. If you do have throw rugs, attach them to the floor with carpet tape. Make sure that you have a light switch at the top of the stairs and the bottom of the stairs. If you do not have them, ask someone to add them for you. What else can I do to help  prevent falls? Wear shoes that: Do not have high heels. Have rubber bottoms. Are comfortable and fit you well. Are closed at the toe. Do not wear sandals. If you use a stepladder: Make sure that it is fully opened. Do not climb a closed stepladder. Make sure that both sides of the stepladder are locked into place. Ask someone to hold it for you, if possible. Clearly mark and make sure that you can see: Any grab bars or handrails. First and last steps. Where the edge of each step is. Use tools that help you move around (mobility aids) if they are needed. These include: Canes. Walkers. Scooters. Crutches. Turn on the lights when you go into a dark area. Replace any light bulbs as soon as they burn out. Set up your furniture so you have a clear path. Avoid moving your furniture around. If any of your floors are uneven, fix them. If there are any pets around you, be aware of where they are. Review your medicines with your doctor. Some medicines can make you feel dizzy. This can increase your chance of falling. Ask your doctor what other things that you can do to help prevent falls. This information is not intended to replace advice given to you by your health care provider. Make sure you discuss any questions you have with your health care provider. Document Released: 09/11/2009 Document Revised: 04/22/2016 Document Reviewed: 12/20/2014 Elsevier Interactive Patient Education  2017 [employer].

## 2023-02-15 NOTE — Progress Notes (Signed)
Subjective:   Candice Brewer is a 89 y.o. female who presents for Medicare Annual (Subsequent) preventive examination. I connected with  Candice Brewer on 02/15/23 by a audio enabled telemedicine application and verified that I am speaking with the correct person using two identifiers.  Patient Location: Home  Provider Location: Home Office  I discussed the limitations of evaluation and management by telemedicine. The patient expressed understanding and agreed to proceed.  Review of Systems     Cardiac Risk Factors include: advanced age (>54men, >16 women);diabetes mellitus;dyslipidemia;hypertension     Objective:    Today's Vitals   02/15/23 0839  Weight: 152 lb (68.9 kg)  Height: 5\' 7"  (1.702 m)   Body mass index is 23.81 kg/m.     02/15/2023    8:44 AM 05/07/2020    6:26 PM 03/14/2018    2:45 PM 02/28/2016    3:36 PM 02/27/2016    9:53 AM 03/10/2015    9:17 AM 10/13/2011    7:40 AM  Advanced Directives  Does Patient Have a Medical Advance Directive? Yes Yes No Yes Yes No;Yes Patient does not have advance directive;Patient would not like information  Type of Scientist, forensic Power of [place];Living will Living will;Healthcare Power of [place] will   Does patient want to make changes to medical advance directive?    No - Patient declined No - Patient declined    Copy of [place] in Chart? No - copy requested   Yes Yes    Would patient like information on creating a medical advance directive?   No - Patient declined No - patient declined information No - patient declined information    Pre-existing out of facility DNR order (yellow form or pink MOST form)       No    Current Medications (verified) Outpatient Encounter Medications as of 02/15/2023  Medication Sig   aspirin EC 81 MG tablet Take 81 mg by mouth daily at 2 PM.   brimonidine-timolol (COMBIGAN) 0.2-0.5 % ophthalmic  solution Place 1 drop into both eyes 2 (two) times daily.    cholecalciferol (VITAMIN D) 1000 units tablet Take 1,000 Units by mouth daily.   diclofenac sodium (VOLTAREN) 1 % GEL Apply 1-2 g topically 4 (four) times daily as needed (for itchy skin).   Diclofenac Sodium 3 % GEL Apply 1 Application topically daily.   enalapril (VASOTEC) 20 MG tablet Take 20 mg by mouth daily.     hydrochlorothiazide (HYDRODIURIL) 25 MG tablet Take 1 tablet (25 mg total) by mouth daily.   latanoprost (XALATAN) 0.005 % ophthalmic solution Place 1 drop into both eyes at bedtime.    Multiple Vitamin (MULTIVITAMIN WITH MINERALS) TABS tablet Take 1 tablet by mouth daily.   sitaGLIPtin-metformin (JANUMET) 50-1000 MG tablet Take 1 tablet by mouth 2 (two) times daily with a meal.   valACYclovir (VALTREX) 1000 MG tablet Take 1,000 mg by mouth 2 (two) times daily.   No facility-administered encounter medications on file as of 02/15/2023.    Allergies (verified) Benzalkonium chloride, Benzalkonium, Neomycin-bacitracin zn-polymyx, Pravastatin sodium, Neomy-bacit-polymyx-pramoxine, and Rosuvastatin   History: Past Medical History:  Diagnosis Date   Arthritis    Chest pain, unspecified    Constipation    DM (diabetes mellitus) ([place])    Dry eyes    Esophageal stricture 09/2011   schatzki's ring   Fibroids    uterus   Glaucoma    Helicobacter  pylori gastritis 2012   Treated   HTN (hypertension)    Hyperlipidemia    Restless legs syndrome (RLS)    Vitamin D deficiency    Past Surgical History:  Procedure Laterality Date   APPENDECTOMY     BREAST BIOPSY Left 03/20/2018   Procedure: BREAST BIOPSY WITH NEEDLE LOCALIZATION;  Surgeon: Virl Cagey, MD;  Location: [facility];  Service: General;  Laterality: Left;   BREAST EXCISIONAL BIOPSY Left 2019   Atypical hyperplasia removed   CARDIOVASCULAR STRESS TEST  2009   CATARACT EXTRACTION  2010   right eye   CATARACT EXTRACTION  2012   left eye   COLONOSCOPY   08/2006   Dr. Vivi Ferns   COLONOSCOPY  10/13/2011   Normal. Next TCS 09/2016.   Procedure: COLONOSCOPY;  Surgeon: Daneil Dolin, MD;  Location: [facility];  Service: Endoscopy;  Laterality: N/A;  8:15   COLONOSCOPY N/A 03/10/2015   RMR: External and anal canal hemorrhoids likely source of hematochezia. Redundant colon. Single sigmoid polyp removed ad described above.    ESOPHAGOGASTRODUODENOSCOPY  06/2008   Dr. Lorrin Mais ring s/p disruption, erosive reflux esophagitis, hh.    ESOPHAGOGASTRODUODENOSCOPY  09/2011   Schatki's ring s/p dilation, multiple 1-59mm antral and bulbar erosions, bx positive for H.Pylori s/ treatment   TUBAL LIGATION     Family History  Problem Relation Age of Onset   Prostate cancer Father    Colon cancer Father        >age 35   Cancer Paternal Grandmother        metastatic at time of diagnosis, primary unknown   Cancer Paternal Aunt        metastatic at time of diagnosisi, primary unknown   Social History   Socioeconomic History   Marital status: Divorced    Spouse name: Not on file   Number of children: 5   Years of education: Not on file   Highest education level: Not on file  Occupational History   Occupation: retired Customer service manager  Tobacco Use   Smoking status: Never   Smokeless tobacco: Never   Tobacco comments:    quit about 35+ yrs  Vaping Use   Vaping Use: Never used  Substance and Sexual Activity   Alcohol use: No    Alcohol/week: 0.0 standard drinks of alcohol   Drug use: No   Sexual activity: Not Currently  Other Topics Concern   Not on file  Social History Narrative   Not on file   Social Determinants of Health   Financial Resource Strain: Low Risk  (02/15/2023)   Overall Financial Resource Strain (CARDIA)    Difficulty of Paying Living Expenses: Not hard at all  Food Insecurity: No Food Insecurity (02/15/2023)   Hunger Vital Sign    Worried About Running Out of Food in the Last Year: Never true    Ran Out of Food in  the Last Year: Never true  Transportation Needs: No Transportation Needs (02/15/2023)   PRAPARE - Hydrologist (Medical): No    Lack of Transportation (Non-Medical): No  Physical Activity: Sufficiently Active (02/15/2023)   Exercise Vital Sign    Days of Exercise per Week: 5 days    Minutes of Exercise per Session: 30 min  Stress: No Stress Concern Present (02/15/2023)   [place]    Feeling of Stress : Not at all  Social Connections: Moderately Integrated (02/15/2023)   Social Connection  and Isolation Panel [NHANES]    Frequency of Communication with Friends and Family: More than three times a week    Frequency of Social Gatherings with Friends and Family: More than three times a week    Attends Religious Services: More than 4 times per year    Active Member of [employer] or Organizations: Yes    Attends Archivist Meetings: More than 4 times per year    Marital Status: Widowed    Tobacco Counseling Counseling given: Not Answered Tobacco comments: quit about 35+ yrs   Clinical Intake:  Pre-visit preparation completed: Yes  Pain : No/denies pain     Nutritional Risks: None Diabetes: Yes CBG done?: No Did pt. bring in CBG monitor from home?: No  How often do you need to have someone help you when you read instructions, pamphlets, or other written materials from your doctor or pharmacy?: 1 - Never  Diabetic?yes  Nutrition Risk Assessment:  Has the patient had any N/V/D within the last 2 months?  No  Does the patient have any non-healing wounds?  No  Has the patient had any unintentional weight loss or weight gain?  No   Diabetes:  Is the patient diabetic?  Yes  If diabetic, was a CBG obtained today?  No  Did the patient bring in their glucometer from home?  No  How often do you monitor your CBG's? Never .   Financial Strains and Diabetes Management:  Are you having  any financial strains with the device, your supplies or your medication? No .  Does the patient want to be seen by Chronic Care Management for management of their diabetes?  No  Would the patient like to be referred to a Nutritionist or for Diabetic Management?  No   Diabetic Exams:  Diabetic Eye Exam: Completed 10/2022 Diabetic Foot Exam: Overdue, Pt has been advised about the importance in completing this exam. Pt is scheduled for diabetic foot exam on next office visit .   Interpreter Needed?: No  Information entered by :: Jadene Pierini, LPN   Activities of Daily Living    02/15/2023    8:44 AM 02/10/2023    3:48 PM  In your present state of health, do you have any difficulty performing the following activities:  Hearing? 0 0  Vision? 0 0  Difficulty concentrating or making decisions? 0 0  Walking or climbing stairs? 0 0  Dressing or bathing? 0 0  Doing errands, shopping? 0 0  Preparing Food and eating ? N N  Using the Toilet? N N  In the past six months, have you accidently leaked urine? N N  Do you have problems with loss of bowel control? N N  Managing your Medications? N N  Managing your Finances? N N  Housekeeping or managing your Housekeeping? N N    Patient Care Team: Baruch Gouty, FNP as PCP - General (Family Medicine)  Indicate any recent Medical Services you may have received from other than [facility] providers in the past year (date may be approximate).     Assessment:   This is a routine wellness examination for Candice Brewer.  Hearing/Vision screen Vision Screening - Comments:: Wears rx glasses - up to date with routine eye exams with  Dr.Barker   Dietary issues and exercise activities discussed: Current Exercise Habits: Home exercise routine, Type of exercise: walking, Time (Minutes): 30, Frequency (Times/Week): 3, Weekly Exercise (Minutes/Week): 90, Intensity: Mild, Exercise limited by: None identified   Goals Addressed  This Visit's Progress     DIET - INCREASE WATER INTAKE         Depression Screen    02/15/2023    8:42 AM 01/19/2023   11:28 AM 10/15/2022   12:32 PM 07/13/2022    2:51 PM  PHQ 2/9 Scores  PHQ - 2 Score 0 0 0 0  PHQ- 9 Score 0 0 0     Fall Risk    02/15/2023    8:41 AM 02/10/2023    3:48 PM 01/19/2023   11:28 AM 11/30/2022    2:34 PM 10/15/2022   12:32 PM  Fall Risk   Falls in the past year? 1 1 1 1  0  Number falls in past yr: 1 1 0 0 0  Injury with Fall? 1 1 1 1  0  Risk for fall due to : History of fall(s);Impaired balance/gait;Orthopedic patient   Impaired balance/gait;Impaired mobility No Fall Risks  Follow up Education provided;Falls prevention discussed  Falls prevention discussed Education provided Falls evaluation completed    FALL RISK PREVENTION PERTAINING TO THE HOME:  Any stairs in or around the home? Yes  If so, are there any without handrails? No  Home free of loose throw rugs in walkways, pet beds, electrical cords, etc? Yes  Adequate lighting in your home to reduce risk of falls? Yes   ASSISTIVE DEVICES UTILIZED TO PREVENT FALLS:  Life alert? No  Use of a cane, walker or w/c? No  Grab bars in the bathroom? Yes  Shower chair or bench in shower? Yes  Elevated toilet seat or a handicapped toilet? Yes        02/15/2023    8:44 AM  6CIT Screen  What Year? 0 points  What month? 0 points  What time? 0 points  Count back from 20 0 points  Months in reverse 0 points  Repeat phrase 0 points  Total Score 0 points    Immunizations Immunization History  Administered Date(s) Administered   Covid-19, Mrna,Vaccine(Spikevax)44yrs and older 10/19/2022   Fluad Quad(high Dose 65+) 08/27/2022   Influenza Split 09/20/2007   Influenza, High Dose Seasonal PF 09/10/2020   Influenza,inj,Quad PF,6+ Mos 09/06/2018, 09/01/2021   Influenza-Unspecified 08/01/2017   Moderna SARS-COV2 Booster Vaccination 01/29/2020, 04/20/2022   Moderna Sars-Covid-2 Vaccination 01/29/2020, 02/26/2020, 09/24/2020    PNEUMOCOCCAL CONJUGATE-20 08/27/2022   Pneumococcal Polysaccharide-23 07/28/2006   Zoster, Live 11/09/2007    TDAP status: Due, Education has been provided regarding the importance of this vaccine. Advised may receive this vaccine at local pharmacy or Health Dept. Aware to provide a copy of the vaccination record if obtained from local pharmacy or Health Dept. Verbalized acceptance and understanding.  Flu Vaccine status: Up to date  Pneumococcal vaccine status: Up to date   Covid-19 vaccine status: Completed vaccines  Qualifies for Shingles Vaccine? Yes   Zostavax completed No   Shingrix Completed?: No.    Education has been provided regarding the importance of this vaccine. Patient has been advised to call insurance company to determine out of pocket expense if they have not yet received this vaccine. Advised may also receive vaccine at local pharmacy or Health Dept. Verbalized acceptance and understanding.  Screening Tests Health Maintenance  Topic Date Due   DTaP/Tdap/Td (1 - Tdap) Never done   Zoster Vaccines- Shingrix (1 of 2) Never done   COVID-19 Vaccine (5 - 2023-24 season) 12/14/2022   HEMOGLOBIN A1C  07/20/2023   OPHTHALMOLOGY EXAM  12/22/2023   FOOT EXAM  01/20/2024  Medicare Annual Wellness (AWV)  02/15/2024   Pneumonia Vaccine 61+ Years old  Completed   INFLUENZA VACCINE  Completed   DEXA SCAN  Completed   HPV VACCINES  Aged Out    Health Maintenance  Health Maintenance Due  Topic Date Due   DTaP/Tdap/Td (1 - Tdap) Never done   Zoster Vaccines- Shingrix (1 of 2) Never done   COVID-19 Vaccine (5 - 2023-24 season) 12/14/2022    Colorectal cancer screening: No longer required.   Mammogram status: No longer required due to age .  Bone Density status: Ordered declined . Pt provided with contact info and advised to call to schedule appt.  Lung Cancer Screening: (Low Dose CT Chest recommended if Age 34-80 years, 30 pack-year currently smoking OR have quit w/in  15years.) does not qualify.   Lung Cancer Screening Referral: n/a  Additional Screening:  Hepatitis C Screening: does not qualify;  Vision Screening: Recommended annual ophthalmology exams for early detection of glaucoma and other disorders of the eye. Is the patient up to date with their annual eye exam?  Yes  Who is the provider or what is the name of the office in which the patient attends annual eye exams? Dr.Barker  If pt is not established with a provider, would they like to be referred to a provider to establish care? No .   Dental Screening: Recommended annual dental exams for proper oral hygiene  Community Resource Referral / Chronic Care Management: CRR required this visit?  No   CCM required this visit?  No      Plan:     I have personally reviewed and noted the following in the patient's chart:   Medical and social history Use of alcohol, tobacco or illicit drugs  Current medications and supplements including opioid prescriptions. Patient is not currently taking opioid prescriptions. Functional ability and status Nutritional status Physical activity Advanced directives List of other physicians Hospitalizations, surgeries, and ER visits in previous 12 months Vitals Screenings to include cognitive, depression, and falls Referrals and appointments  In addition, I have reviewed and discussed with patient certain preventive protocols, quality metrics, and best practice recommendations. A written personalized care plan for preventive services as well as general preventive health recommendations were provided to patient.     Daphane Shepherd, LPN   [id number]   Nurse Notes: Will bring in Vaccines records to update TDAP

## 2023-03-01 DIAGNOSIS — I10 Essential (primary) hypertension: Secondary | ICD-10-CM | POA: Diagnosis not present

## 2023-03-01 DIAGNOSIS — E119 Type 2 diabetes mellitus without complications: Secondary | ICD-10-CM | POA: Diagnosis not present

## 2023-03-08 DIAGNOSIS — B351 Tinea unguium: Secondary | ICD-10-CM | POA: Diagnosis not present

## 2023-03-08 DIAGNOSIS — L84 Corns and callosities: Secondary | ICD-10-CM | POA: Diagnosis not present

## 2023-03-08 DIAGNOSIS — E1142 Type 2 diabetes mellitus with diabetic polyneuropathy: Secondary | ICD-10-CM | POA: Diagnosis not present

## 2023-03-08 DIAGNOSIS — M79676 Pain in unspecified toe(s): Secondary | ICD-10-CM | POA: Diagnosis not present

## 2023-03-22 DIAGNOSIS — H04122 Dry eye syndrome of left lacrimal gland: Secondary | ICD-10-CM | POA: Diagnosis not present

## 2023-03-22 DIAGNOSIS — Z947 Corneal transplant status: Secondary | ICD-10-CM | POA: Diagnosis not present

## 2023-03-22 DIAGNOSIS — H401131 Primary open-angle glaucoma, bilateral, mild stage: Secondary | ICD-10-CM | POA: Diagnosis not present

## 2023-03-29 DIAGNOSIS — E119 Type 2 diabetes mellitus without complications: Secondary | ICD-10-CM | POA: Diagnosis not present

## 2023-03-29 DIAGNOSIS — I10 Essential (primary) hypertension: Secondary | ICD-10-CM | POA: Diagnosis not present

## 2023-03-31 DIAGNOSIS — E119 Type 2 diabetes mellitus without complications: Secondary | ICD-10-CM | POA: Diagnosis not present

## 2023-03-31 DIAGNOSIS — I10 Essential (primary) hypertension: Secondary | ICD-10-CM | POA: Diagnosis not present

## 2023-04-20 ENCOUNTER — Ambulatory Visit (INDEPENDENT_AMBULATORY_CARE_PROVIDER_SITE_OTHER): Payer: 59 | Admitting: Family Medicine

## 2023-04-20 ENCOUNTER — Encounter: Payer: Self-pay | Admitting: Family Medicine

## 2023-04-20 VITALS — BP 166/84 | HR 67 | Temp 96.8°F | Ht 67.0 in | Wt 149.4 lb

## 2023-04-20 DIAGNOSIS — E785 Hyperlipidemia, unspecified: Secondary | ICD-10-CM | POA: Diagnosis not present

## 2023-04-20 DIAGNOSIS — I152 Hypertension secondary to endocrine disorders: Secondary | ICD-10-CM

## 2023-04-20 DIAGNOSIS — E559 Vitamin D deficiency, unspecified: Secondary | ICD-10-CM | POA: Insufficient documentation

## 2023-04-20 DIAGNOSIS — E119 Type 2 diabetes mellitus without complications: Secondary | ICD-10-CM

## 2023-04-20 DIAGNOSIS — E1169 Type 2 diabetes mellitus with other specified complication: Secondary | ICD-10-CM | POA: Diagnosis not present

## 2023-04-20 DIAGNOSIS — Z7984 Long term (current) use of oral hypoglycemic drugs: Secondary | ICD-10-CM | POA: Diagnosis not present

## 2023-04-20 DIAGNOSIS — E1159 Type 2 diabetes mellitus with other circulatory complications: Secondary | ICD-10-CM | POA: Diagnosis not present

## 2023-04-20 DIAGNOSIS — I10 Essential (primary) hypertension: Secondary | ICD-10-CM | POA: Diagnosis not present

## 2023-04-20 LAB — BAYER DCA HB A1C WAIVED: HB A1C (BAYER DCA - WAIVED): 7.1 % — ABNORMAL HIGH (ref 4.8–5.6)

## 2023-04-20 MED ORDER — ENALAPRIL MALEATE 10 MG PO TABS: 10.0000 mg | ORAL_TABLET | Freq: Two times a day (BID) | ORAL | 2 refills | Status: DC

## 2023-04-20 NOTE — Addendum Note (Signed)
Addended by: Sonny Masters on: 04/20/2023 08:29 PM   Modules accepted: Level of Service

## 2023-04-20 NOTE — Progress Notes (Signed)
Subjective:  Patient ID: Candice Brewer, female    DOB: 1932-07-24, 89 y.o.   MRN: [id number]  Patient Care Team: Sonny Masters, FNP as PCP - General (Family Medicine)   Chief Complaint:  Diabetes (3 month follow up ) and Numbness (End of finger tips numbness since starting medication )   HPI: Candice Brewer is a 89 y.o. female presenting on 04/20/2023 for Diabetes (3 month follow up ) and Numbness (End of finger tips numbness since starting medication ) Pt is accompanied by her son and daughter today. Very pleasant family. Wanted to meet their mother's new provider and discuss mother's care and their concerns.   1. Type 2 diabetes mellitus without complication, without long-term current use of insulin (HCC) States she has been taking her medications as prescribed but does report some tingling / numbness to her finger tips at times. She denies polyuria, polyphagia, or polydipsia.   2. Hypertension associated with diabetes (HCC) Taking medications as prescribed. Denies headaches, chest pain, leg swelling, weakness or confusion. Does report some dizziness when she stands up at times. This does not last long and is not associated with any other symptoms.   3. Hyperlipidemia associated with type 2 diabetes mellitus (HCC) Not on statin therapy but does watch her diet. She is very active on a daily basis and participates in activities several times per week.   4. Vitamin D deficiency Takes a multivitamin daily along with a 400 iu Vit D3. She denies arthralgias, trouble walking, or recent fractures.      Relevant past medical, surgical, family, and social history reviewed and updated as indicated.  Allergies and medications reviewed and updated. Data reviewed: Chart in Epic.   Past Medical History:  Diagnosis Date   Arthritis    Chest pain, unspecified    Constipation    DM (diabetes mellitus) (HCC)    Dry eyes    Esophageal stricture 09/2011   schatzki's ring   Fibroids     uterus   Glaucoma    Helicobacter pylori gastritis 2012   Treated   HTN (hypertension)    Hyperlipidemia    Restless legs syndrome (RLS)    Vitamin D deficiency     Past Surgical History:  Procedure Laterality Date   APPENDECTOMY     BREAST BIOPSY Left 03/20/2018   Procedure: BREAST BIOPSY WITH NEEDLE LOCALIZATION;  Surgeon: Lucretia Roers, MD;  Location: [facility];  Service: General;  Laterality: Left;   BREAST EXCISIONAL BIOPSY Left 2019   Atypical hyperplasia removed   CARDIOVASCULAR STRESS TEST  2009   CATARACT EXTRACTION  2010   right eye   CATARACT EXTRACTION  2012   left eye   COLONOSCOPY  08/2006   Dr. Elmer Ramp   COLONOSCOPY  10/13/2011   Normal. Next TCS 09/2016.   Procedure: COLONOSCOPY;  Surgeon: Corbin Ade, MD;  Location: [facility];  Service: Endoscopy;  Laterality: N/A;  8:15   COLONOSCOPY N/A 03/10/2015   RMR: External and anal canal hemorrhoids likely source of hematochezia. Redundant colon. Single sigmoid polyp removed ad described above.    ESOPHAGOGASTRODUODENOSCOPY  06/2008   Dr. Sinda Du ring s/p disruption, erosive reflux esophagitis, hh.    ESOPHAGOGASTRODUODENOSCOPY  09/2011   Schatki's ring s/p dilation, multiple 1-10mm antral and bulbar erosions, bx positive for H.Pylori s/ treatment   TUBAL LIGATION      Social History   Socioeconomic History   Marital status: Divorced    Spouse name:  Not on file   Number of children: 5   Years of education: Not on file   Highest education level: Associate degree: occupational, Scientist, product/process development, or vocational program  Occupational History   Occupation: retired Psychologist, occupational  Tobacco Use   Smoking status: Never   Smokeless tobacco: Never   Tobacco comments:    quit about 35+ yrs  Vaping Use   Vaping Use: Never used  Substance and Sexual Activity   Alcohol use: No    Alcohol/week: 0.0 standard drinks of alcohol   Drug use: No   Sexual activity: Not Currently  Other Topics Concern   Not on file   Social History Narrative   Not on file   Social Determinants of Health   Financial Resource Strain: Low Risk  (04/17/2023)   Overall Financial Resource Strain (CARDIA)    Difficulty of Paying Living Expenses: Not very hard  Food Insecurity: No Food Insecurity (04/17/2023)   Hunger Vital Sign    Worried About Running Out of Food in the Last Year: Never true    Ran Out of Food in the Last Year: Never true  Transportation Needs: No Transportation Needs (04/17/2023)   PRAPARE - Administrator, Civil Service (Medical): No    Lack of Transportation (Non-Medical): No  Physical Activity: Sufficiently Active (04/17/2023)   Exercise Vital Sign    Days of Exercise per Week: 4 days    Minutes of Exercise per Session: 60 min  Stress: No Stress Concern Present (04/17/2023)   [employer] of Occupational Health - Occupational Stress Questionnaire    Feeling of Stress : Not at all  Social Connections: Moderately Integrated (04/17/2023)   Social Connection and Isolation Panel [NHANES]    Frequency of Communication with Friends and Family: More than three times a week    Frequency of Social Gatherings with Friends and Family: More than three times a week    Attends Religious Services: More than 4 times per year    Active Member of [employer] or Organizations: Yes    Attends Engineer, structural: More than 4 times per year    Marital Status: Divorced  Intimate Partner Violence: Not At Risk (02/15/2023)   Humiliation, Afraid, Rape, and Kick questionnaire    Fear of Current or Ex-Partner: No    Emotionally Abused: No    Physically Abused: No    Sexually Abused: No    Outpatient Encounter Medications as of 04/20/2023  Medication Sig   aspirin EC 81 MG tablet Take 81 mg by mouth daily at 2 PM.   brimonidine-timolol (COMBIGAN) 0.2-0.5 % ophthalmic solution Place 1 drop into both eyes 2 (two) times daily.    cholecalciferol (VITAMIN D) 1000 units tablet Take 1,000 Units by mouth  daily.   diclofenac sodium (VOLTAREN) 1 % GEL Apply 1-2 g topically 4 (four) times daily as needed (for itchy skin).   enalapril (VASOTEC) 10 MG tablet Take 1 tablet (10 mg total) by mouth 2 (two) times daily.   hydrochlorothiazide (HYDRODIURIL) 25 MG tablet Take 1 tablet (25 mg total) by mouth daily.   latanoprost (XALATAN) 0.005 % ophthalmic solution Place 1 drop into both eyes at bedtime.    Multiple Vitamin (MULTIVITAMIN WITH MINERALS) TABS tablet Take 1 tablet by mouth daily.   sitaGLIPtin-metformin (JANUMET) 50-1000 MG tablet Take 1 tablet by mouth 2 (two) times daily with a meal.   valACYclovir (VALTREX) 1000 MG tablet Take 1,000 mg by mouth 2 (two) times daily.   [DISCONTINUED] Diclofenac  Sodium 3 % GEL Apply 1 Application topically daily.   [DISCONTINUED] enalapril (VASOTEC) 20 MG tablet Take 20 mg by mouth daily.     No facility-administered encounter medications on file as of 04/20/2023.    Allergies  Allergen Reactions   Benzalkonium Chloride Other (See Comments)    Other Reaction(s): Unknown   Benzalkonium Other (See Comments)   Neomycin-Bacitracin Zn-Polymyx     REACTION: "Severe skin rash"   Pravastatin Sodium     REACTION: "Funny feeling and leg pain"   Neomy-Bacit-Polymyx-Pramoxine Rash   Rosuvastatin Rash    Review of Systems  Constitutional:  Negative for activity change, appetite change, chills, diaphoresis, fatigue, fever and unexpected weight change.  HENT: Negative.    Eyes: Negative.  Negative for photophobia and visual disturbance.  Respiratory:  Negative for cough, chest tightness and shortness of breath.   Cardiovascular:  Negative for chest pain, palpitations and leg swelling.  Gastrointestinal:  Negative for abdominal pain, blood in stool, constipation, diarrhea, nausea and vomiting.  Endocrine: Negative.  Negative for polydipsia, polyphagia and polyuria.  Genitourinary:  Negative for decreased urine volume, difficulty urinating, dysuria, frequency and  urgency.  Musculoskeletal:  Negative for arthralgias and myalgias.  Skin: Negative.   Allergic/Immunologic: Negative.   Neurological:  Positive for dizziness and numbness. Negative for tremors, seizures, syncope, facial asymmetry, speech difficulty, weakness, light-headedness and headaches.  Hematological: Negative.   Psychiatric/Behavioral:  Negative for confusion, hallucinations, sleep disturbance and suicidal ideas.   All other systems reviewed and are negative.       Objective:  BP (!) 166/84   Pulse 67   Temp (!) 96.8 F (36 C) (Temporal)   Ht 5\' 7"  (1.702 m)   Wt 149 lb 6.4 oz (67.8 kg)   SpO2 100%   BMI 23.40 kg/m    Wt Readings from Last 3 Encounters:  04/20/23 149 lb 6.4 oz (67.8 kg)  02/15/23 152 lb (68.9 kg)  01/19/23 155 lb 3.2 oz (70.4 kg)    Physical Exam Vitals and nursing note reviewed.  Constitutional:      General: She is not in acute distress.    Appearance: Normal appearance. She is well-developed and well-groomed. She is not ill-appearing, toxic-appearing or diaphoretic.  HENT:     Head: Normocephalic and atraumatic.     Jaw: There is normal jaw occlusion.     Right Ear: Hearing normal.     Left Ear: Hearing normal.     Nose: Nose normal.     Mouth/Throat:     Lips: Pink.     Mouth: Mucous membranes are moist.     Pharynx: Uvula midline.  Eyes:     General: Lids are normal.     Extraocular Movements: Extraocular movements intact.     Right eye: No nystagmus.     Left eye: No nystagmus.     Conjunctiva/sclera: Conjunctivae normal.     Pupils: Pupils are equal, round, and reactive to light.  Neck:     Vascular: No JVD.     Trachea: Trachea and phonation normal.  Cardiovascular:     Rate and Rhythm: Normal rate and regular rhythm.     Chest Wall: PMI is not displaced.     Pulses: Normal pulses.     Heart sounds: Normal heart sounds. No murmur heard.    No friction rub. No gallop.  Pulmonary:     Effort: Pulmonary effort is normal. No  respiratory distress.     Breath sounds: Normal breath sounds.  No wheezing.  Abdominal:     General: There is no abdominal bruit.     Palpations: There is no hepatomegaly or splenomegaly.  Musculoskeletal:        General: Normal range of motion.     Cervical back: Normal range of motion and neck supple.     Right lower leg: No edema.     Left lower leg: No edema.     Right foot: Bunion present.     Left foot: Bunion present.  Skin:    General: Skin is warm and dry.     Capillary Refill: Capillary refill takes less than 2 seconds.     Coloration: Skin is not cyanotic, jaundiced or pale.     Findings: No rash.  Neurological:     General: No focal deficit present.     Mental Status: She is alert and oriented to person, place, and time.     GCS: GCS eye subscore is 4. GCS verbal subscore is 5. GCS motor subscore is 6.     Cranial Nerves: Cranial nerves 2-12 are intact.     Sensory: Sensation is intact.     Motor: Motor function is intact.     Coordination: Coordination is intact.     Gait: Gait is intact.     Deep Tendon Reflexes: Reflexes are normal and symmetric.  Psychiatric:        Attention and Perception: Attention and perception normal.        Mood and Affect: Mood and affect normal.        Speech: Speech normal.        Behavior: Behavior normal. Behavior is cooperative.        Thought Content: Thought content normal.        Cognition and Memory: Cognition and memory normal.        Judgment: Judgment normal.     Results for orders placed or performed in visit on 04/20/23  Bayer DCA Hb A1c Waived  Result Value Ref Range   HB A1C (BAYER DCA - WAIVED) 7.1 (H) 4.8 - 5.6 %       Pertinent labs & imaging results that were available during my care of the patient were reviewed by me and considered in my medical decision making.  Assessment & Plan:  Rozlyn was seen today for diabetes and numbness.  Diagnoses and all orders for this visit:  Type 2 diabetes mellitus without  complication, without long-term current use of insulin (HCC) A1C 7.1 today, well controlled. Other labs pending. Diet and exercise encouraged. Needs diabetic shoes due to bilateral bunions, DME prescription provided.  -     CMP14+EGFR -     CBC with Differential/Platelet -     Bayer DCA Hb A1c Waived -     CBC with Differential/Platelet -     CMP14+EGFR -     Lipid panel -     Thyroid Panel With TSH -     VITAMIN D 25 Hydroxy (Vit-D Deficiency, Fractures) -     For home use only DME Other see comment -     Vitamin B12  Hypertension associated with diabetes (HCC) BP not controlled. Changes were made in regimen today, due to intermittent dizziness, will change enalapril dosing to 10 mg twice daily instead of 20 mg in the morning. Goal BP is 140/90. Pt aware to report any persistent high or low readings. DASH diet and exercise encouraged. Exercise at least 150 minutes per week and increase as  tolerated. Goal BMI > 25. Stress management encouraged. Avoid nicotine and tobacco product use. Avoid excessive alcohol and NSAID's. Avoid more than 2000 mg of sodium daily. Medications as prescribed. Follow up as scheduled.  -     CMP14+EGFR -     CBC with Differential/Platelet -     enalapril (VASOTEC) 10 MG tablet; Take 1 tablet (10 mg total) by mouth 2 (two) times daily. -     Thyroid Panel With TSH  Hyperlipidemia associated with type 2 diabetes mellitus (HCC) Diet and exercise encouraged. Labs pending.  -     CMP14+EGFR -     Lipid panel  Vitamin D deficiency Labs pending. Continue repletion therapy. If indicated, will change repletion dosage. Eat foods rich in Vit D including milk, orange juice, yogurt with vitamin D added, salmon or mackerel, canned tuna fish, cereals with vitamin D added, and cod liver oil. Get out in the sun but make sure to wear at least SPF 30 sunscreen.  -     VITAMIN D 25 Hydroxy (Vit-D Deficiency, Fractures)     Continue all other maintenance medications.  Follow up  plan: Return in about 3 months (around 07/21/2023), or if symptoms worsen or fail to improve, for DM.   Continue healthy lifestyle choices, including diet (rich in fruits, vegetables, and lean proteins, and low in salt and simple carbohydrates) and exercise (at least 30 minutes of moderate physical activity daily).  Educational handout given for DM  The above assessment and management plan was discussed with the patient. The patient verbalized understanding of and has agreed to the management plan. Patient is aware to call the clinic if they develop any new symptoms or if symptoms persist or worsen. Patient is aware when to return to the clinic for a follow-up visit. Patient educated on when it is appropriate to go to the emergency department.   Kari Baars, FNP-C [facility] [phone]

## 2023-04-20 NOTE — Patient Instructions (Addendum)

## 2023-04-21 LAB — CBC WITH DIFFERENTIAL/PLATELET
Basophils Absolute: 0 10*3/uL (ref 0.0–0.2)
Basos: 0 %
EOS (ABSOLUTE): 0.1 10*3/uL (ref 0.0–0.4)
Eos: 1 %
Hematocrit: 35.5 % (ref 34.0–46.6)
Hemoglobin: 11.9 g/dL (ref 11.1–15.9)
Immature Grans (Abs): 0 10*3/uL (ref 0.0–0.1)
Immature Granulocytes: 0 %
Lymphocytes Absolute: 1.6 10*3/uL (ref 0.7–3.1)
Lymphs: 23 %
MCH: 35 pg — ABNORMAL HIGH (ref 26.6–33.0)
MCHC: 33.5 g/dL (ref 31.5–35.7)
MCV: 104 fL — ABNORMAL HIGH (ref 79–97)
Monocytes Absolute: 0.4 10*3/uL (ref 0.1–0.9)
Monocytes: 5 %
Neutrophils Absolute: 5.1 10*3/uL (ref 1.4–7.0)
Neutrophils: 71 %
Platelets: 242 10*3/uL (ref 150–450)
RBC: 3.4 x10E6/uL — ABNORMAL LOW (ref 3.77–5.28)
RDW: 12 % (ref 11.7–15.4)
WBC: 7.2 10*3/uL (ref 3.4–10.8)

## 2023-04-21 LAB — CMP14+EGFR
ALT: 19 IU/L (ref 0–32)
AST: 22 IU/L (ref 0–40)
Albumin/Globulin Ratio: 1.7 (ref 1.2–2.2)
Albumin: 4.5 g/dL (ref 3.7–4.7)
Alkaline Phosphatase: 83 IU/L (ref 44–121)
BUN/Creatinine Ratio: 23 (ref 12–28)
BUN: 34 mg/dL — ABNORMAL HIGH (ref 8–27)
Bilirubin Total: 0.4 mg/dL (ref 0.0–1.2)
CO2: 20 mmol/L (ref 20–29)
Calcium: 10.4 mg/dL — ABNORMAL HIGH (ref 8.7–10.3)
Chloride: 100 mmol/L (ref 96–106)
Creatinine, Ser: 1.5 mg/dL — ABNORMAL HIGH (ref 0.57–1.00)
Globulin, Total: 2.7 g/dL (ref 1.5–4.5)
Glucose: 139 mg/dL — ABNORMAL HIGH (ref 70–99)
Potassium: 4.4 mmol/L (ref 3.5–5.2)
Sodium: 139 mmol/L (ref 134–144)
Total Protein: 7.2 g/dL (ref 6.0–8.5)
eGFR: 33 mL/min/{1.73_m2} — ABNORMAL LOW (ref >59)

## 2023-04-22 LAB — LIPID PANEL
Chol/HDL Ratio: 2 ratio (ref 0.0–4.4)
Cholesterol, Total: 186 mg/dL (ref 100–199)
HDL: 93 mg/dL (ref >39)
LDL Chol Calc (NIH): 79 mg/dL (ref 0–99)
Triglycerides: 78 mg/dL (ref 0–149)
VLDL Cholesterol Cal: 14 mg/dL (ref 5–40)

## 2023-04-22 LAB — VITAMIN B12: Vitamin B-12: 426 pg/mL (ref 232–1245)

## 2023-04-22 LAB — THYROID PANEL WITH TSH
Free Thyroxine Index: 1.4 (ref 1.2–4.9)
T3 Uptake Ratio: 33 % (ref 24–39)
T4, Total: 4.3 ug/dL — ABNORMAL LOW (ref 4.5–12.0)
TSH: 1.38 u[IU]/mL (ref 0.450–4.500)

## 2023-04-22 LAB — VITAMIN D 25 HYDROXY (VIT D DEFICIENCY, FRACTURES): Vit D, 25-Hydroxy: 51.6 ng/mL (ref 30.0–100.0)

## 2023-04-28 ENCOUNTER — Other Ambulatory Visit: Payer: Self-pay | Admitting: Family Medicine

## 2023-04-28 DIAGNOSIS — E119 Type 2 diabetes mellitus without complications: Secondary | ICD-10-CM

## 2023-04-29 DIAGNOSIS — E119 Type 2 diabetes mellitus without complications: Secondary | ICD-10-CM | POA: Diagnosis not present

## 2023-04-29 DIAGNOSIS — I10 Essential (primary) hypertension: Secondary | ICD-10-CM | POA: Diagnosis not present

## 2023-04-30 DIAGNOSIS — I10 Essential (primary) hypertension: Secondary | ICD-10-CM | POA: Diagnosis not present

## 2023-04-30 DIAGNOSIS — E119 Type 2 diabetes mellitus without complications: Secondary | ICD-10-CM | POA: Diagnosis not present

## 2023-05-05 ENCOUNTER — Ambulatory Visit (INDEPENDENT_AMBULATORY_CARE_PROVIDER_SITE_OTHER): Payer: 59 | Admitting: Family Medicine

## 2023-05-05 ENCOUNTER — Encounter: Payer: Self-pay | Admitting: Family Medicine

## 2023-05-05 VITALS — Ht 66.0 in | Wt 153.0 lb

## 2023-05-05 DIAGNOSIS — E119 Type 2 diabetes mellitus without complications: Secondary | ICD-10-CM | POA: Diagnosis not present

## 2023-05-05 NOTE — Progress Notes (Signed)
Medical Nutrition Therapy Appt start time: 1430 end time: 1530 (1 hour) PCP Gilford Silvius, FNP [facility])  Daughter Candice Brewer  Primary concerns today: Blood sugar control.   Relevant history/background: Candice Brewer was referred by her PCP Gilford Silvius, FNP for MNT related to diabetes.  Other diagnoses include hyperlipidemia, HTN, vitamin D deficiency, and esophageal dysphagia (Shatzki's ring).  A1c on 04/20/23 was 7.1%.      Assessment:  Candice Brewer lives with her daughter and son-in-law, and shares dinner with them.  She prepares her own breakfast and lunch.  She checks FBG a few times per week, which have been 120s to 150s.  She likes a wide variety of foods, including fruits and veg's.  A recent concern has been loose stools several times weekly; has not determined any particular pattern or predictability.    Learning Readiness: Ready  Weight: 153.0 lb; ht is 66". FBG: 120-150s.  Usual eating pattern: 3 meals and 1-2 snacks per day. Frequent foods and beverages: water, coffee with 2 tbsp 2% milk, hot tea ~2 X wk; cooked oat meal with 1 tbsp honey, berries, and milk, Honey Nut Cheerios with 2% milk, fish, chx, beef, veg's, fruit, plain yogurt with fruit ~2 X wk.   Avoided foods: white bread.   Usual physical activity: 60-min exercise class 4 X wk at (alternate upper and lower body with chair ex's the senior rec center in [place]; walks in the evening ~20 min (~1 mi) 5-7 days/week.   Sleep: Estimates >8 hours/night. Gets up 1-2 X night for bathroom.   Food security -  Within the past 12 months:    - Did you run out of food, and not have money to get more?   No.     - Did you worry that your food would run out before you could afford to buy more?   No.   24-hr recall: (Up at 7 AM; drank water) B (9 AM)-   1/2 c dry oatmeal, 1/2 c blueberries, 1 tbsp honey, 1/4 c 2% milk, 1 c coffee, 1-2 tbsp 2% milk, 1 toast, 1 tbsp cream chs, water Snk ( AM)-   Water at ex class L (12 PM)-  Take-out:  1 hot dog in bun, must, slaw, onions, 1 c fries, water Snk (3 PM)-  1 kiwi, water D (7 PM)-  4 oz flounder, side salad, 2 tbsp Ital drsng, 1/2 swt potato, water  Snk (9:30)-  1 diet popsicle Typical day? Yes.     Nutritional Diagnosis:  NI-5.8.2 Excessive carbohydrate intake As related to imbalanced meals.  As evidenced by diet history and 24-hr recall indicating usual breakfast of limited protein and lunch that included no veg's and ~4 starch portions.  Handouts given during visit include: After-Visit Summary (AVS) FBG log  Demonstrated degree of understanding via:  Teach Back  Barriers to learning/adherence to lifestyle change: Longstanding routines and taste preferences, although I have the impression that Candice Brewer is highly motivated for change, and has good support in her daughter.    For behavioral goals and recommendations, see Patient Instructions.    Monitoring/Evaluation:  Dietary intake, exercise, FBG, and body weight in 3 week(s).

## 2023-05-05 NOTE — Patient Instructions (Addendum)
Cereal: Look for those that have at least 5 grams of fiber per serving, e.g., All Bran Original, Bran Buds, Bran Flakes, Frosted Mini-Wheats, Shredded Wheat.    Loose stools:  Any time this happens, immediately write down the date and time, as well as what you last ate, how much, and at what time.    Behavioral Goals:  1 . To help maintain a stable blood glucose level, include at each meal the following:     - Breakfast: A source of protein and a source of carbohydrate (fruit is optional.)    - Lunch and dinner: A source of protein, a source of carbohydrate, and vegetables.       - More fruits and veg's provide potassium, which is important for your blood pressure control.  2. Limit carb foods to 2 portions per meal.  - ONE slice of bread (or its equivalent, such as half of a hamburger bun).  - 1/2 cup of a "scoopable" starchy food such as potatoes or rice.  - 15 grams of Total Carbohydrate as shown on food label.  - 4 ounces of a sweet drink (including fruit juice). Suggestion: Sparkling water (or club soda or seltzer), including those with "natural flavors."       You may want to try some low-carb (and/or high-protein) breads or wraps.  TASTE PREFERENCES ARE LEARNED.  This means it will get easier to choose foods you know are good for you if you are exposed to them enough.        Carbohydrate includes starch, sugar, and fiber.  Of these, only sugar and starch raise blood glucose.  (Fiber is found in fruits, vegetables [especially skin, seeds, and stalks], whole grains, and beans.)   Starchy (carb) foods: Bread, rice, pasta, potatoes, corn, cereal, grits, crackers, bagels, muffins, all baked goods.  (Fruit, milk, and yogurt also have carbohydrate, but most of these foods will not spike your blood sugar as most starchy or sweet foods will.)  A few fruits do cause high blood sugars; use small portions of bananas (limit to 1/2 at a time), grapes, watermelon, and oranges.   Protein foods: Meat,  fish, poultry, eggs, dairy foods, and beans such as pinto and kidney beans (beans also provide carbohydrate).    Follow-up appt on Tues, June 25 at 2:30 PM.  Bring your fasting blood glucose form.

## 2023-05-17 DIAGNOSIS — Z947 Corneal transplant status: Secondary | ICD-10-CM | POA: Diagnosis not present

## 2023-05-17 DIAGNOSIS — H04122 Dry eye syndrome of left lacrimal gland: Secondary | ICD-10-CM | POA: Diagnosis not present

## 2023-05-17 DIAGNOSIS — E119 Type 2 diabetes mellitus without complications: Secondary | ICD-10-CM | POA: Diagnosis not present

## 2023-05-17 DIAGNOSIS — H401131 Primary open-angle glaucoma, bilateral, mild stage: Secondary | ICD-10-CM | POA: Diagnosis not present

## 2023-05-23 DIAGNOSIS — E119 Type 2 diabetes mellitus without complications: Secondary | ICD-10-CM | POA: Diagnosis not present

## 2023-05-23 DIAGNOSIS — I10 Essential (primary) hypertension: Secondary | ICD-10-CM | POA: Diagnosis not present

## 2023-05-23 DIAGNOSIS — M2010 Hallux valgus (acquired), unspecified foot: Secondary | ICD-10-CM | POA: Diagnosis not present

## 2023-05-23 DIAGNOSIS — L84 Corns and callosities: Secondary | ICD-10-CM | POA: Diagnosis not present

## 2023-05-24 ENCOUNTER — Ambulatory Visit: Payer: TRICARE For Life (TFL) | Admitting: Family Medicine

## 2023-05-25 ENCOUNTER — Telehealth: Payer: Self-pay | Admitting: Family Medicine

## 2023-05-25 DIAGNOSIS — M79606 Pain in leg, unspecified: Secondary | ICD-10-CM

## 2023-05-25 DIAGNOSIS — E119 Type 2 diabetes mellitus without complications: Secondary | ICD-10-CM

## 2023-05-25 DIAGNOSIS — I152 Hypertension secondary to endocrine disorders: Secondary | ICD-10-CM

## 2023-05-26 ENCOUNTER — Other Ambulatory Visit: Payer: 59

## 2023-05-26 DIAGNOSIS — M79676 Pain in unspecified toe(s): Secondary | ICD-10-CM | POA: Diagnosis not present

## 2023-05-26 DIAGNOSIS — L84 Corns and callosities: Secondary | ICD-10-CM | POA: Diagnosis not present

## 2023-05-26 DIAGNOSIS — M79606 Pain in leg, unspecified: Secondary | ICD-10-CM | POA: Diagnosis not present

## 2023-05-26 DIAGNOSIS — R6889 Other general symptoms and signs: Secondary | ICD-10-CM | POA: Diagnosis not present

## 2023-05-26 DIAGNOSIS — B351 Tinea unguium: Secondary | ICD-10-CM | POA: Diagnosis not present

## 2023-05-26 DIAGNOSIS — E1142 Type 2 diabetes mellitus with diabetic polyneuropathy: Secondary | ICD-10-CM | POA: Diagnosis not present

## 2023-05-26 NOTE — Telephone Encounter (Signed)
We need to collect a urine to make sure she does not have an infection as this is not a typical side effect of medication. We also need to check an anemia profile, Vit D, BMP and magnesium to see if she is deficient causing the leg pain. She can make an appointment to see me or to just have labs completed.

## 2023-05-26 NOTE — Telephone Encounter (Signed)
Patient aware and verbalizes understanding - future lab placed 

## 2023-05-27 ENCOUNTER — Other Ambulatory Visit (HOSPITAL_COMMUNITY): Payer: Self-pay | Admitting: Family Medicine

## 2023-05-27 DIAGNOSIS — Z1231 Encounter for screening mammogram for malignant neoplasm of breast: Secondary | ICD-10-CM

## 2023-05-27 LAB — URINALYSIS, ROUTINE W REFLEX MICROSCOPIC
Bilirubin, UA: NEGATIVE
Glucose, UA: NEGATIVE
Ketones, UA: NEGATIVE
Nitrite, UA: NEGATIVE
Protein,UA: NEGATIVE
Specific Gravity, UA: <1.005 — ABNORMAL LOW (ref 1.005–1.030)
Urobilinogen, Ur: 0.2 mg/dL (ref 0.2–1.0)
pH, UA: 5.5 (ref 5.0–7.5)

## 2023-05-27 LAB — ANEMIA PROFILE B
Basophils Absolute: 0 10*3/uL (ref 0.0–0.2)
Basos: 1 %
EOS (ABSOLUTE): 0.1 10*3/uL (ref 0.0–0.4)
Eos: 1 %
Ferritin: 70 ng/mL (ref 15–150)
Folate: >20 ng/mL (ref >3.0)
Hematocrit: 34.2 % (ref 34.0–46.6)
Hemoglobin: 11.3 g/dL (ref 11.1–15.9)
Immature Grans (Abs): 0 10*3/uL (ref 0.0–0.1)
Immature Granulocytes: 0 %
Iron Saturation: 18 % (ref 15–55)
Iron: 71 ug/dL (ref 27–139)
Lymphocytes Absolute: 1.8 10*3/uL (ref 0.7–3.1)
Lymphs: 22 %
MCH: 34.6 pg — ABNORMAL HIGH (ref 26.6–33.0)
MCHC: 33 g/dL (ref 31.5–35.7)
MCV: 105 fL — ABNORMAL HIGH (ref 79–97)
Monocytes Absolute: 0.5 10*3/uL (ref 0.1–0.9)
Monocytes: 6 %
Neutrophils Absolute: 5.7 10*3/uL (ref 1.4–7.0)
Neutrophils: 70 %
Platelets: 271 10*3/uL (ref 150–450)
RBC: 3.27 x10E6/uL — ABNORMAL LOW (ref 3.77–5.28)
RDW: 12.5 % (ref 11.7–15.4)
Retic Ct Pct: 1.7 % (ref 0.6–2.6)
Total Iron Binding Capacity: 384 ug/dL (ref 250–450)
UIBC: 313 ug/dL (ref 118–369)
Vitamin B-12: 419 pg/mL (ref 232–1245)
WBC: 8.1 10*3/uL (ref 3.4–10.8)

## 2023-05-27 LAB — BASIC METABOLIC PANEL
BUN/Creatinine Ratio: 22 (ref 12–28)
BUN: 29 mg/dL — ABNORMAL HIGH (ref 8–27)
CO2: 21 mmol/L (ref 20–29)
Calcium: 10.6 mg/dL — ABNORMAL HIGH (ref 8.7–10.3)
Chloride: 94 mmol/L — ABNORMAL LOW (ref 96–106)
Creatinine, Ser: 1.34 mg/dL — ABNORMAL HIGH (ref 0.57–1.00)
Glucose: 119 mg/dL — ABNORMAL HIGH (ref 70–99)
Potassium: 4.7 mmol/L (ref 3.5–5.2)
Sodium: 131 mmol/L — ABNORMAL LOW (ref 134–144)
eGFR: 38 mL/min/{1.73_m2} — ABNORMAL LOW (ref >59)

## 2023-05-27 LAB — MAGNESIUM: Magnesium: 1.7 mg/dL (ref 1.6–2.3)

## 2023-05-27 LAB — MICROSCOPIC EXAMINATION
Epithelial Cells (non renal): NONE SEEN /hpf (ref 0–10)
RBC, Urine: NONE SEEN /hpf (ref 0–2)
Renal Epithel, UA: NONE SEEN /hpf

## 2023-05-27 LAB — VITAMIN D 25 HYDROXY (VIT D DEFICIENCY, FRACTURES): Vit D, 25-Hydroxy: 45 ng/mL (ref 30.0–100.0)

## 2023-05-28 LAB — URINE CULTURE

## 2023-05-29 DIAGNOSIS — I10 Essential (primary) hypertension: Secondary | ICD-10-CM | POA: Diagnosis not present

## 2023-05-29 DIAGNOSIS — E119 Type 2 diabetes mellitus without complications: Secondary | ICD-10-CM | POA: Diagnosis not present

## 2023-05-30 ENCOUNTER — Ambulatory Visit (INDEPENDENT_AMBULATORY_CARE_PROVIDER_SITE_OTHER): Payer: 59 | Admitting: Family Medicine

## 2023-05-30 ENCOUNTER — Encounter: Payer: Self-pay | Admitting: Family Medicine

## 2023-05-30 VITALS — Ht 66.0 in | Wt 149.6 lb

## 2023-05-30 DIAGNOSIS — I10 Essential (primary) hypertension: Secondary | ICD-10-CM | POA: Diagnosis not present

## 2023-05-30 DIAGNOSIS — E119 Type 2 diabetes mellitus without complications: Secondary | ICD-10-CM

## 2023-05-30 NOTE — Progress Notes (Signed)
Medical Nutrition Therapy Appt start time: 1430 end time: 1530 (1 hour) PCP Gilford Silvius, FNP [facility])  Daughter Candice Brewer  Primary concerns today: Blood sugar control.   Relevant history/background: Candice Brewer was referred by her PCP Gilford Silvius, FNP for MNT related to diabetes.  Other diagnoses include hyperlipidemia, HTN, vitamin D deficiency, and esophageal dysphagia (Shatzki's ring).  A1c on 04/20/23 was 7.1%.      Assessment:  Candice Brewer participates in the [employer] program, which includes FBG and daily BP, which gets sent to her PCP (although I am not familiar with the process, and have not determined if/how to access within MyChart).  Saturday's FBG was 198, after a night of pain from leg cramping.  Other FBG have been <160, and often in the 130s.  She has not used the FBG log provided at last appt, thinking that all her providers already have access to this info.  I explained that an important purpose of recording her BG is to familiarize herself with her numbers and what affects them.  She has made an effort to limit carb foods since her 05/05/23 MNT appt. Although Candice Brewer kept a complete food record, she did not include any episodes of loose stools, so it is still impossible to know if/which foods are contributing to her symptoms.     Learning Readiness: Ready  Weight: 149.6 lb; 153.0 lb on 05/05/23; ht is 66". FBG: 130-140s.  Usual eating pattern: 3 meals and 1 snack (usually nuts, occasionally chips) per day. Usual physical activity: 60-min exercise class 4 X wk at (alternate upper and lower body with chair ex's the senior rec center in [place]; walks in the evening ~20 min (~1 mi) 5-7 days/week.  Left leg has been bothering her, so may be cutting back.   Sleep: Estimates >8 hours/night. Gets up 1-2 X night for bathroom.   Food security -  Within the past 12 months:    - Did you run out or worry that you would run out of food, and not have money to get more?    No.    24-hr  recall:  (Up at 8 AM; FBG was 158; SBP was 15X mmHg; drank water) B (9 AM)-  4 oz cranberry juice, 1 c Honey Nut Cheerios, 3/4 c milk, 1/2 banana, 1 c coffee, 2% milk, 1 tbsp, 2% water Snk ( AM)-  water L (1 PM)-  1/2 c chx salad, 2 slc bread, lett, tom slices, 1/2 c Jell-O, 1/2 c apple sauce, water  Snk ( PM)-  --- D (7 PM)-  2 c spaghetti w/ meat sauce, salad, 2 tbsp dressing, 1/2 c Jell-O, water Snk ( PM)-  1/2 c nuts, water Typical day? Yes.     Nutritional Diagnosis: Some progress noted on NI-5.8.2 Excessive carbohydrate intake As related to imbalanced meals.  As evidenced by diet history and 24-hr recall indicating more balanced meals in terms of protein and starch as well as more moderate number of starch portions at meals.    Handouts given during visit include: After-Visit Summary (AVS)  Demonstrated degree of understanding via:  Teach Back  Barriers to learning/adherence to lifestyle change: Longstanding routines and taste preferences, although I have the impression that Candice Brewer is highly motivated for change, and has good support in her daughter.    For behavioral goals and recommendations, see Patient Instructions.    Monitoring/Evaluation:  Dietary intake, exercise, FBG, and body weight in 5 week(s).

## 2023-05-30 NOTE — Patient Instructions (Addendum)
Remember that fresh fruit makes a great snack.    Deli meats: These are ok sometimes, but the better meats are those you cook at home (not processed).  You can cook a bunch of chicken (or [place] breast), slice, and freeze portions for sandwiches. It tastes better, and is better for you!  Concerns about diarrhea:  Any time you have an episode of loose stools, write down the date and time, and immediately write down the last 3 meals and snacks you had, including what foods, what time, and how much of each.    Behavioral Goals remain the same:  1 . To help maintain a stable blood glucose level, include at each meal the following:     - Breakfast: A source of protein and a source of carbohydrate (fruit is optional.)    - Lunch and dinner: A source of protein, a source of carbohydrate, and vegetables.       - More fruits and veg's provide potassium, which is important for your blood pressure control.  2. Limit carb foods to 2 portions per meal.  - ONE slice of bread (or its equivalent, such as half of a hamburger bun).  - 1/2 cup of a "scoopable" starchy food such as potatoes or rice.  - 15 grams of Total Carbohydrate as shown on food label.  - 4 ounces of a sweet drink (including fruit juice).  - Follow-up appt on Monday, August 12 at 2:30 PM.  Bring your fasting blood glucose form.

## 2023-06-27 ENCOUNTER — Ambulatory Visit (HOSPITAL_COMMUNITY)
Admission: RE | Admit: 2023-06-27 | Discharge: 2023-06-27 | Disposition: A | Discharge status: Home / Self Care | Payer: TRICARE For Life (TFL) | Source: Ambulatory Visit | Attending: Family Medicine | Admitting: Family Medicine

## 2023-06-27 ENCOUNTER — Encounter (HOSPITAL_COMMUNITY): Payer: Self-pay

## 2023-06-27 DIAGNOSIS — Z1231 Encounter for screening mammogram for malignant neoplasm of breast: Secondary | ICD-10-CM | POA: Insufficient documentation

## 2023-06-29 DIAGNOSIS — I10 Essential (primary) hypertension: Secondary | ICD-10-CM | POA: Diagnosis not present

## 2023-06-29 DIAGNOSIS — E119 Type 2 diabetes mellitus without complications: Secondary | ICD-10-CM | POA: Diagnosis not present

## 2023-07-04 ENCOUNTER — Ambulatory Visit (HOSPITAL_COMMUNITY)
Admission: RE | Admit: 2023-07-04 | Discharge: 2023-07-04 | Disposition: A | Discharge status: Home / Self Care | Payer: 59 | Source: Ambulatory Visit | Attending: Family Medicine | Admitting: Family Medicine

## 2023-07-04 ENCOUNTER — Encounter (HOSPITAL_COMMUNITY): Payer: Self-pay

## 2023-07-04 DIAGNOSIS — Z1231 Encounter for screening mammogram for malignant neoplasm of breast: Secondary | ICD-10-CM | POA: Insufficient documentation

## 2023-07-11 ENCOUNTER — Ambulatory Visit: Payer: TRICARE For Life (TFL) | Admitting: Family Medicine

## 2023-07-14 DIAGNOSIS — E119 Type 2 diabetes mellitus without complications: Secondary | ICD-10-CM | POA: Diagnosis not present

## 2023-07-14 DIAGNOSIS — I10 Essential (primary) hypertension: Secondary | ICD-10-CM | POA: Diagnosis not present

## 2023-07-21 ENCOUNTER — Encounter: Payer: Self-pay | Admitting: Family Medicine

## 2023-07-21 ENCOUNTER — Ambulatory Visit (INDEPENDENT_AMBULATORY_CARE_PROVIDER_SITE_OTHER): Payer: 59 | Admitting: Family Medicine

## 2023-07-21 VITALS — BP 139/67 | HR 70 | Temp 97.4°F | Ht 66.0 in | Wt 144.4 lb

## 2023-07-21 DIAGNOSIS — E119 Type 2 diabetes mellitus without complications: Secondary | ICD-10-CM

## 2023-07-21 DIAGNOSIS — N183 Chronic kidney disease, stage 3 unspecified: Secondary | ICD-10-CM | POA: Diagnosis not present

## 2023-07-21 DIAGNOSIS — M79605 Pain in left leg: Secondary | ICD-10-CM | POA: Diagnosis not present

## 2023-07-21 DIAGNOSIS — M25462 Effusion, left knee: Secondary | ICD-10-CM | POA: Diagnosis not present

## 2023-07-21 DIAGNOSIS — E1159 Type 2 diabetes mellitus with other circulatory complications: Secondary | ICD-10-CM

## 2023-07-21 DIAGNOSIS — Z7984 Long term (current) use of oral hypoglycemic drugs: Secondary | ICD-10-CM

## 2023-07-21 DIAGNOSIS — E1122 Type 2 diabetes mellitus with diabetic chronic kidney disease: Secondary | ICD-10-CM

## 2023-07-21 DIAGNOSIS — M25562 Pain in left knee: Secondary | ICD-10-CM | POA: Diagnosis not present

## 2023-07-21 DIAGNOSIS — I152 Hypertension secondary to endocrine disorders: Secondary | ICD-10-CM

## 2023-07-21 LAB — BAYER DCA HB A1C WAIVED: HB A1C (BAYER DCA - WAIVED): 6.5 % — ABNORMAL HIGH (ref 4.8–5.6)

## 2023-07-21 NOTE — Progress Notes (Signed)
Subjective:  Patient ID: Candice Brewer, female    DOB: [birth year], 89 y.o.   MRN: [id number]  Patient Care Team: Sonny Masters, FNP as PCP - General (Family Medicine)   Chief Complaint:  Diabetes (3 month follow up ) and Leg Pain (Left leg pain since she fell on christmas )   HPI: Candice Brewer is a 89 y.o. female presenting on 07/21/2023 for Diabetes (3 month follow up ) and Leg Pain (Left leg pain since she fell on christmas )   1. Type 2 diabetes mellitus without complication, without long-term current use of insulin (HCC) Has met with Dr, Gerilyn Pilgrim and did make some changes to her dietary intake. She is taking her medications as prescribed. No polyuria, polyphagia, or polydipsia.    2. Hypertension associated with diabetes (HCC) Complaint with meds - Yes Current Medications - enalapril  Checking BP at home ranging 118-130/60-80 Exercising Regularly - Yes Watching Salt intake - Yes Pertinent ROS:  Headache - No Fatigue - No Visual Disturbances - No Chest pain - No Dyspnea - No Palpitations - No LE edema - No They report good compliance with medications and can restate their regimen by memory. No medication side effects.  BP Readings from Last 3 Encounters:  07/21/23 139/67  04/20/23 (!) 166/84  01/19/23 (!) 163/89     3. Left leg pain This has been ongoing since a fall at Christmas. States the pain will start at her knee and radiate up and down her leg. No imaging to date. States she has started to fall due to pain in her leg. She has not been taking anything for the pain. States is is aching to sharp and shooting at times. No new injuries since the fall at Christmas.       Relevant past medical, surgical, family, and social history reviewed and updated as indicated.  Allergies and medications reviewed and updated. Data reviewed: Chart in Epic.   Past Medical History:  Diagnosis Date   Arthritis    Chest pain, unspecified    Constipation    DM (diabetes  mellitus) (HCC)    Dry eyes    Esophageal stricture 09/2011   schatzki's ring   Fibroids    uterus   Glaucoma    Helicobacter pylori gastritis 2012   Treated   HTN (hypertension)    Hyperlipidemia    Restless legs syndrome (RLS)    Vitamin D deficiency     Past Surgical History:  Procedure Laterality Date   APPENDECTOMY     BREAST BIOPSY Left 03/20/2018   Procedure: BREAST BIOPSY WITH NEEDLE LOCALIZATION;  Surgeon: Lucretia Roers, MD;  Location: [facility];  Service: General;  Laterality: Left;   BREAST EXCISIONAL BIOPSY Left 2019   Atypical hyperplasia removed   CARDIOVASCULAR STRESS TEST  2009   CATARACT EXTRACTION  2010   right eye   CATARACT EXTRACTION  2012   left eye   COLONOSCOPY  08/2006   Dr. Elmer Ramp   COLONOSCOPY  10/13/2011   Normal. Next TCS 09/2016.   Procedure: COLONOSCOPY;  Surgeon: Corbin Ade, MD;  Location: [facility];  Service: Endoscopy;  Laterality: N/A;  8:15   COLONOSCOPY N/A 03/10/2015   RMR: External and anal canal hemorrhoids likely source of hematochezia. Redundant colon. Single sigmoid polyp removed ad described above.    ESOPHAGOGASTRODUODENOSCOPY  06/2008   Dr. Sinda Du ring s/p disruption, erosive reflux esophagitis, hh.    ESOPHAGOGASTRODUODENOSCOPY  09/2011  Schatki's ring s/p dilation, multiple 1-70mm antral and bulbar erosions, bx positive for H.Pylori s/ treatment   TUBAL LIGATION      Social History   Socioeconomic History   Marital status: Divorced    Spouse name: Not on file   Number of children: 5   Years of education: Not on file   Highest education level: Associate degree: occupational, Scientist, product/process development, or vocational program  Occupational History   Occupation: retired Psychologist, occupational  Tobacco Use   Smoking status: Never   Smokeless tobacco: Never   Tobacco comments:    quit about 35+ yrs  Vaping Use   Vaping status: Never Used  Substance and Sexual Activity   Alcohol use: No    Alcohol/week: 0.0 standard drinks  of alcohol   Drug use: No   Sexual activity: Not Currently  Other Topics Concern   Not on file  Social History Narrative   Not on file   Social Determinants of Health   Financial Resource Strain: Low Risk  (04/17/2023)   Overall Financial Resource Strain (CARDIA)    Difficulty of Paying Living Expenses: Not very hard  Food Insecurity: No Food Insecurity (04/17/2023)   Hunger Vital Sign    Worried About Running Out of Food in the Last Year: Never true    Ran Out of Food in the Last Year: Never true  Transportation Needs: No Transportation Needs (04/17/2023)   PRAPARE - Administrator, Civil Service (Medical): No    Lack of Transportation (Non-Medical): No  Physical Activity: Sufficiently Active (04/17/2023)   Exercise Vital Sign    Days of Exercise per Week: 4 days    Minutes of Exercise per Session: 60 min  Stress: No Stress Concern Present (04/17/2023)   [employer] of Occupational Health - Occupational Stress Questionnaire    Feeling of Stress : Not at all  Social Connections: Moderately Integrated (04/17/2023)   Social Connection and Isolation Panel [NHANES]    Frequency of Communication with Friends and Family: More than three times a week    Frequency of Social Gatherings with Friends and Family: More than three times a week    Attends Religious Services: More than 4 times per year    Active Member of [employer] or Organizations: Yes    Attends Engineer, structural: More than 4 times per year    Marital Status: Divorced  Intimate Partner Violence: Not At Risk (02/15/2023)   Humiliation, Afraid, Rape, and Kick questionnaire    Fear of Current or Ex-Partner: No    Emotionally Abused: No    Physically Abused: No    Sexually Abused: No    Outpatient Encounter Medications as of 07/21/2023  Medication Sig   aspirin EC 81 MG tablet Take 81 mg by mouth daily at 2 PM.   brimonidine-timolol (COMBIGAN) 0.2-0.5 % ophthalmic solution Place 1 drop into both eyes 2  (two) times daily.    cholecalciferol (VITAMIN D) 1000 units tablet Take 1,000 Units by mouth daily.   hydrochlorothiazide (HYDRODIURIL) 25 MG tablet Take 1 tablet (25 mg total) by mouth daily.   latanoprost (XALATAN) 0.005 % ophthalmic solution Place 1 drop into both eyes at bedtime.    Multiple Vitamin (MULTIVITAMIN WITH MINERALS) TABS tablet Take 1 tablet by mouth daily.   OVER THE COUNTER MEDICATION Valaren cream   sitaGLIPtin-metformin (JANUMET) 50-1000 MG tablet Take 1 tablet by mouth 2 (two) times daily with a meal.   valACYclovir (VALTREX) 1000 MG tablet Take 1,000 mg by  mouth 2 (two) times daily.   enalapril (VASOTEC) 10 MG tablet Take 1 tablet (10 mg total) by mouth 2 (two) times daily.   [DISCONTINUED] diclofenac sodium (VOLTAREN) 1 % GEL Apply 1-2 g topically 4 (four) times daily as needed (for itchy skin).   No facility-administered encounter medications on file as of 07/21/2023.    Allergies  Allergen Reactions   Benzalkonium Chloride Other (See Comments)    Other Reaction(s): Unknown   Benzalkonium Other (See Comments)   Neomycin-Bacitracin Zn-Polymyx     REACTION: "Severe skin rash"   Pravastatin Sodium     REACTION: "Funny feeling and leg pain"   Neomy-Bacit-Polymyx-Pramoxine Rash   Rosuvastatin Rash    Review of Systems  Constitutional:  Negative for activity change, appetite change, chills, diaphoresis, fatigue, fever and unexpected weight change.  HENT: Negative.    Eyes: Negative.  Negative for photophobia and visual disturbance.  Respiratory:  Negative for cough, chest tightness and shortness of breath.   Cardiovascular:  Negative for chest pain, palpitations and leg swelling.  Gastrointestinal:  Negative for abdominal pain, blood in stool, constipation, diarrhea, nausea and vomiting.  Endocrine: Negative.  Negative for cold intolerance, heat intolerance, polydipsia, polyphagia and polyuria.  Genitourinary:  Negative for decreased urine volume, difficulty  urinating, dysuria, frequency and urgency.  Musculoskeletal:  Positive for arthralgias, gait problem, joint swelling and myalgias. Negative for back pain, neck pain and neck stiffness.  Skin: Negative.   Allergic/Immunologic: Negative.   Neurological:  Negative for dizziness, tremors, seizures, syncope, facial asymmetry, speech difficulty, weakness, light-headedness, numbness and headaches.  Hematological: Negative.   Psychiatric/Behavioral:  Negative for confusion, hallucinations, sleep disturbance and suicidal ideas.   All other systems reviewed and are negative.       Objective:  BP 139/67   Pulse 70   Temp (!) 97.4 F (36.3 C) (Temporal)   Ht 5\' 6"  (1.676 m)   Wt 144 lb 6.4 oz (65.5 kg)   SpO2 100%   BMI 23.31 kg/m    Wt Readings from Last 3 Encounters:  07/21/23 144 lb 6.4 oz (65.5 kg)  05/30/23 149 lb 9.6 oz (67.9 kg)  05/05/23 153 lb (69.4 kg)    Physical Exam Vitals and nursing note reviewed.  Constitutional:      General: She is not in acute distress.    Appearance: Normal appearance. She is well-developed and well-groomed. She is not ill-appearing, toxic-appearing or diaphoretic.  HENT:     Head: Normocephalic and atraumatic.     Jaw: There is normal jaw occlusion.     Right Ear: Hearing normal.     Left Ear: Hearing normal.     Nose: Nose normal.     Mouth/Throat:     Lips: Pink.     Mouth: Mucous membranes are moist.     Pharynx: Oropharynx is clear. Uvula midline.  Eyes:     General: Lids are normal.     Extraocular Movements: Extraocular movements intact.     Conjunctiva/sclera: Conjunctivae normal.     Pupils: Pupils are equal, round, and reactive to light.  Neck:     Thyroid: No thyroid mass, thyromegaly or thyroid tenderness.     Vascular: No carotid bruit or JVD.     Trachea: Trachea and phonation normal.  Cardiovascular:     Rate and Rhythm: Normal rate and regular rhythm.     Chest Wall: PMI is not displaced.     Pulses: Normal pulses.      Heart sounds: Normal  heart sounds. No murmur heard.    No friction rub. No gallop.  Pulmonary:     Effort: Pulmonary effort is normal. No respiratory distress.     Breath sounds: Normal breath sounds. No wheezing.  Abdominal:     General: Bowel sounds are normal. There is no distension or abdominal bruit.     Palpations: Abdomen is soft. There is no hepatomegaly or splenomegaly.     Tenderness: There is no abdominal tenderness. There is no right CVA tenderness or left CVA tenderness.     Hernia: No hernia is present.  Musculoskeletal:        General: Normal range of motion.     Cervical back: Normal range of motion and neck supple.     Left upper leg: Normal.     Left knee: Swelling (popliteal space) present. Tenderness (popliteal space) present.     Right lower leg: No edema.     Left lower leg: Normal. No edema.     Left ankle: Normal.  Lymphadenopathy:     Cervical: No cervical adenopathy.  Skin:    General: Skin is warm and dry.     Capillary Refill: Capillary refill takes less than 2 seconds.     Coloration: Skin is not cyanotic, jaundiced or pale.     Findings: No rash.  Neurological:     General: No focal deficit present.     Mental Status: She is alert and oriented to person, place, and time.     Sensory: Sensation is intact.     Motor: Motor function is intact.     Coordination: Coordination is intact.     Gait: Gait is intact.     Deep Tendon Reflexes: Reflexes are normal and symmetric.  Psychiatric:        Attention and Perception: Attention and perception normal.        Mood and Affect: Mood and affect normal.        Speech: Speech normal.        Behavior: Behavior normal. Behavior is cooperative.        Thought Content: Thought content normal.        Cognition and Memory: Cognition and memory normal.        Judgment: Judgment normal.     Results for orders placed or performed in visit on 05/26/23  Urine Culture   Specimen: Urine   UR  Result Value Ref  Range   Urine Culture, Routine Final report    Organism ID, Bacteria Comment   Microscopic Examination   Urine  Result Value Ref Range   WBC, UA 0-5 0 - 5 /hpf   RBC, Urine None seen 0 - 2 /hpf   Epithelial Cells (non renal) None seen 0 - 10 /hpf   Renal Epithel, UA None seen None seen /hpf   Bacteria, UA Few (A) None seen/Few  VITAMIN D 25 Hydroxy (Vit-D Deficiency, Fractures)  Result Value Ref Range   Vit D, 25-Hydroxy 45.0 30.0 - 100.0 ng/mL  Basic Metabolic Panel  Result Value Ref Range   Glucose 119 (H) 70 - 99 mg/dL   BUN 29 (H) 8 - 27 mg/dL   Creatinine, Ser 1.61 (H) 0.57 - 1.00 mg/dL   eGFR 38 (L) >09 UE/AVW/0.98   BUN/Creatinine Ratio 22 12 - 28   Sodium 131 (L) 134 - 144 mmol/L   Potassium 4.7 3.5 - 5.2 mmol/L   Chloride 94 (L) 96 - 106 mmol/L   CO2 21 20 -  29 mmol/L   Calcium 10.6 (H) 8.7 - 10.3 mg/dL  Magnesium  Result Value Ref Range   Magnesium 1.7 1.6 - 2.3 mg/dL  Anemia Profile B  Result Value Ref Range   Total Iron Binding Capacity 384 250 - 450 ug/dL   UIBC 528 413 - 244 ug/dL   Iron 71 27 - 010 ug/dL   Iron Saturation 18 15 - 55 %   Ferritin 70 15 - 150 ng/mL   Vitamin B-12 419 232 - 1,245 pg/mL   Folate >20.0 >3.0 ng/mL   WBC 8.1 3.4 - 10.8 x10E3/uL   RBC 3.27 (L) 3.77 - 5.28 x10E6/uL   Hemoglobin 11.3 11.1 - 15.9 g/dL   Hematocrit 27.2 53.6 - 46.6 %   MCV 105 (H) 79 - 97 fL   MCH 34.6 (H) 26.6 - 33.0 pg   MCHC 33.0 31.5 - 35.7 g/dL   RDW 64.4 03.4 - 74.2 %   Platelets 271 150 - 450 x10E3/uL   Neutrophils 70 Not Estab. %   Lymphs 22 Not Estab. %   Monocytes 6 Not Estab. %   Eos 1 Not Estab. %   Basos 1 Not Estab. %   Neutrophils Absolute 5.7 1.4 - 7.0 x10E3/uL   Lymphocytes Absolute 1.8 0.7 - 3.1 x10E3/uL   Monocytes Absolute 0.5 0.1 - 0.9 x10E3/uL   EOS (ABSOLUTE) 0.1 0.0 - 0.4 x10E3/uL   Basophils Absolute 0.0 0.0 - 0.2 x10E3/uL   Immature Granulocytes 0 Not Estab. %   Immature Grans (Abs) 0.0 0.0 - 0.1 x10E3/uL   Retic Ct Pct 1.7 0.6 -  2.6 %  Urinalysis, Routine w reflex microscopic  Result Value Ref Range   Specific Gravity, UA <1.005 (L) 1.005 - 1.030   pH, UA 5.5 5.0 - 7.5   Color, UA Yellow Yellow   Appearance Ur Clear Clear   Leukocytes,UA 1+ (A) Negative   Protein,UA Negative Negative/Trace   Glucose, UA Negative Negative   Ketones, UA Negative Negative   RBC, UA Trace (A) Negative   Bilirubin, UA Negative Negative   Urobilinogen, Ur 0.2 0.2 - 1.0 mg/dL   Nitrite, UA Negative Negative   Microscopic Examination See below:        Pertinent labs & imaging results that were available during my care of the patient were reviewed by me and considered in my medical decision making.  Assessment & Plan:  Charle was seen today for diabetes and leg pain.  Diagnoses and all orders for this visit:  Type 2 diabetes mellitus without complication, without long-term current use of insulin (HCC) A1C 6.5 today. Continue current medications along with diet and exercise.  -     Bayer DCA Hb A1c Waived -     Anemia Profile B  Hypertension associated with diabetes (HCC) BP well controlled. Changes were not made in regimen today. Goal BP is 130/80. Pt aware to report any persistent high or low readings. DASH diet and exercise encouraged. Exercise at least 150 minutes per week and increase as tolerated. Goal BMI > 25. Stress management encouraged. Avoid nicotine and tobacco product use. Avoid excessive alcohol and NSAID's. Avoid more than 2000 mg of sodium daily. Medications as prescribed. Follow up as scheduled.  -     CBC with Differential/Platelet -     CMP14+EGFR -     Thyroid Panel With TSH -     Anemia Profile B  Left leg pain Pain and swelling of left knee Will check for possible B12  and ferritin deficiencies that could be causative of symptoms. Has palpable mass behind left knee, likely a Bakers Cyst, will obtain US and refer to ortho if warranted.  -     Ambulatory referral to Physical Therapy -     Anemia Profile  B -     US SOFT TISSUE LOWER EXTREMITY LIMITED RIGHT (NON-VASCULAR); Future     Continue all other maintenance medications.  Follow up plan: Return in about 3 months (around 10/21/2023), or if symptoms worsen or fail to improve, for DM.   Continue healthy lifestyle choices, including diet (rich in fruits, vegetables, and lean proteins, and low in salt and simple carbohydrates) and exercise (at least 30 minutes of moderate physical activity daily).  Educational handout given for DM  The above assessment and management plan was discussed with the patient. The patient verbalized understanding of and has agreed to the management plan. Patient is aware to call the clinic if they develop any new symptoms or if symptoms persist or worsen. Patient is aware when to return to the clinic for a follow-up visit. Patient educated on when it is appropriate to go to the emergency department.   Kari Baars, FNP-C [facility] [phone]

## 2023-07-22 DIAGNOSIS — Z947 Corneal transplant status: Secondary | ICD-10-CM | POA: Diagnosis not present

## 2023-07-22 LAB — CBC WITH DIFFERENTIAL/PLATELET
Basophils Absolute: 0 10*3/uL (ref 0.0–0.2)
Basos: 0 %
EOS (ABSOLUTE): 0 10*3/uL (ref 0.0–0.4)
Eos: 0 %
Hematocrit: 33.3 % — ABNORMAL LOW (ref 34.0–46.6)
Hemoglobin: 10.9 g/dL — ABNORMAL LOW (ref 11.1–15.9)
Immature Grans (Abs): 0 10*3/uL (ref 0.0–0.1)
Immature Granulocytes: 0 %
Lymphocytes Absolute: 1.5 10*3/uL (ref 0.7–3.1)
Lymphs: 20 %
MCH: 34.5 pg — ABNORMAL HIGH (ref 26.6–33.0)
MCHC: 32.7 g/dL (ref 31.5–35.7)
MCV: 105 fL — ABNORMAL HIGH (ref 79–97)
Monocytes Absolute: 0.3 10*3/uL (ref 0.1–0.9)
Monocytes: 4 %
Neutrophils Absolute: 5.5 10*3/uL (ref 1.4–7.0)
Neutrophils: 76 %
Platelets: 249 10*3/uL (ref 150–450)
RBC: 3.16 x10E6/uL — ABNORMAL LOW (ref 3.77–5.28)
RDW: 12.9 % (ref 11.7–15.4)
WBC: 7.4 10*3/uL (ref 3.4–10.8)

## 2023-07-22 LAB — CMP14+EGFR
ALT: 14 IU/L (ref 0–32)
AST: 17 IU/L (ref 0–40)
Albumin: 4.3 g/dL (ref 3.7–4.7)
Alkaline Phosphatase: 91 IU/L (ref 44–121)
BUN/Creatinine Ratio: 21 (ref 12–28)
BUN: 34 mg/dL — ABNORMAL HIGH (ref 8–27)
Bilirubin Total: 0.4 mg/dL (ref 0.0–1.2)
CO2: 23 mmol/L (ref 20–29)
Calcium: 10.3 mg/dL (ref 8.7–10.3)
Chloride: 100 mmol/L (ref 96–106)
Creatinine, Ser: 1.61 mg/dL — ABNORMAL HIGH (ref 0.57–1.00)
Globulin, Total: 2.5 g/dL (ref 1.5–4.5)
Glucose: 171 mg/dL — ABNORMAL HIGH (ref 70–99)
Potassium: 4.6 mmol/L (ref 3.5–5.2)
Sodium: 137 mmol/L (ref 134–144)
Total Protein: 6.8 g/dL (ref 6.0–8.5)
eGFR: 30 mL/min/{1.73_m2} — ABNORMAL LOW (ref >59)

## 2023-07-22 LAB — THYROID PANEL WITH TSH
Free Thyroxine Index: 1.3 (ref 1.2–4.9)
T3 Uptake Ratio: 32 % (ref 24–39)
T4, Total: 4 ug/dL — ABNORMAL LOW (ref 4.5–12.0)
TSH: 1.58 u[IU]/mL (ref 0.450–4.500)

## 2023-07-25 ENCOUNTER — Other Ambulatory Visit: Payer: Self-pay | Admitting: Family Medicine

## 2023-07-25 ENCOUNTER — Ambulatory Visit (HOSPITAL_COMMUNITY)
Admission: RE | Admit: 2023-07-25 | Discharge: 2023-07-25 | Disposition: A | Discharge status: Home / Self Care | Payer: TRICARE For Life (TFL) | Source: Ambulatory Visit | Attending: Family Medicine | Admitting: Family Medicine

## 2023-07-25 DIAGNOSIS — M7989 Other specified soft tissue disorders: Secondary | ICD-10-CM | POA: Diagnosis not present

## 2023-07-25 DIAGNOSIS — M25562 Pain in left knee: Secondary | ICD-10-CM | POA: Insufficient documentation

## 2023-07-25 DIAGNOSIS — M25462 Effusion, left knee: Secondary | ICD-10-CM | POA: Diagnosis not present

## 2023-07-25 DIAGNOSIS — E1159 Type 2 diabetes mellitus with other circulatory complications: Secondary | ICD-10-CM

## 2023-07-25 DIAGNOSIS — M79605 Pain in left leg: Secondary | ICD-10-CM

## 2023-07-25 DIAGNOSIS — E119 Type 2 diabetes mellitus without complications: Secondary | ICD-10-CM

## 2023-07-25 MED ORDER — DAPAGLIFLOZIN PROPANEDIOL 10 MG PO TABS: 10.0000 mg | ORAL_TABLET | Freq: Every day | ORAL | 6 refills | Status: DC

## 2023-07-25 NOTE — Addendum Note (Signed)
Addended by: Sonny Masters on: 07/25/2023 04:40 PM   Modules accepted: Orders

## 2023-07-26 LAB — ANEMIA PROFILE B
Ferritin: 72 ng/mL (ref 15–150)
Folate: >20 ng/mL (ref >3.0)
Iron Saturation: 21 % (ref 15–55)
Iron: 72 ug/dL (ref 27–139)
Total Iron Binding Capacity: 347 ug/dL (ref 250–450)
UIBC: 275 ug/dL (ref 118–369)
Vitamin B-12: 492 pg/mL (ref 232–1245)

## 2023-07-26 LAB — SPECIMEN STATUS REPORT

## 2023-07-29 DIAGNOSIS — I10 Essential (primary) hypertension: Secondary | ICD-10-CM | POA: Diagnosis not present

## 2023-07-29 DIAGNOSIS — E119 Type 2 diabetes mellitus without complications: Secondary | ICD-10-CM | POA: Diagnosis not present

## 2023-07-30 DIAGNOSIS — I10 Essential (primary) hypertension: Secondary | ICD-10-CM | POA: Diagnosis not present

## 2023-07-30 DIAGNOSIS — E119 Type 2 diabetes mellitus without complications: Secondary | ICD-10-CM | POA: Diagnosis not present

## 2023-08-04 ENCOUNTER — Ambulatory Visit: Payer: TRICARE For Life (TFL)

## 2023-08-04 DIAGNOSIS — E1142 Type 2 diabetes mellitus with diabetic polyneuropathy: Secondary | ICD-10-CM | POA: Diagnosis not present

## 2023-08-04 DIAGNOSIS — L84 Corns and callosities: Secondary | ICD-10-CM | POA: Diagnosis not present

## 2023-08-04 DIAGNOSIS — M79676 Pain in unspecified toe(s): Secondary | ICD-10-CM | POA: Diagnosis not present

## 2023-08-04 DIAGNOSIS — B351 Tinea unguium: Secondary | ICD-10-CM | POA: Diagnosis not present

## 2023-08-08 ENCOUNTER — Other Ambulatory Visit: Payer: Self-pay

## 2023-08-08 ENCOUNTER — Ambulatory Visit: Payer: 59 | Attending: Family Medicine

## 2023-08-08 DIAGNOSIS — R262 Difficulty in walking, not elsewhere classified: Secondary | ICD-10-CM | POA: Diagnosis not present

## 2023-08-08 DIAGNOSIS — M6281 Muscle weakness (generalized): Secondary | ICD-10-CM | POA: Diagnosis not present

## 2023-08-08 DIAGNOSIS — M79605 Pain in left leg: Secondary | ICD-10-CM | POA: Insufficient documentation

## 2023-08-08 NOTE — Therapy (Signed)
OUTPATIENT PHYSICAL THERAPY LOWER EXTREMITY EVALUATION   Patient Name: Candice Brewer MRN: [id number] DOB:1932-04-05, 89 y.o., female Today's Date: 08/08/2023  END OF SESSION:  PT End of Session - 08/08/23 1102     Visit Number 1    Number of Visits 12    Date for PT Re-Evaluation 10/28/23    PT Start Time 1103    PT Stop Time 1144    PT Time Calculation (min) 41 min    Activity Tolerance Patient tolerated treatment well    Behavior During Therapy [facility] for tasks assessed/performed             Past Medical History:  Diagnosis Date   Arthritis    Chest pain, unspecified    Constipation    DM (diabetes mellitus) (HCC)    Dry eyes    Esophageal stricture 09/2011   schatzki's ring   Fibroids    uterus   Glaucoma    Helicobacter pylori gastritis 2012   Treated   HTN (hypertension)    Hyperlipidemia    Restless legs syndrome (RLS)    Vitamin D deficiency    Past Surgical History:  Procedure Laterality Date   APPENDECTOMY     BREAST BIOPSY Left 03/20/2018   Procedure: BREAST BIOPSY WITH NEEDLE LOCALIZATION;  Surgeon: Lucretia Roers, MD;  Location: [facility];  Service: General;  Laterality: Left;   BREAST EXCISIONAL BIOPSY Left 2019   Atypical hyperplasia removed   CARDIOVASCULAR STRESS TEST  2009   CATARACT EXTRACTION  2010   right eye   CATARACT EXTRACTION  2012   left eye   COLONOSCOPY  08/2006   Dr. Elmer Ramp   COLONOSCOPY  10/13/2011   Normal. Next TCS 09/2016.   Procedure: COLONOSCOPY;  Surgeon: Corbin Ade, MD;  Location: [facility];  Service: Endoscopy;  Laterality: N/A;  8:15   COLONOSCOPY N/A 03/10/2015   RMR: External and anal canal hemorrhoids likely source of hematochezia. Redundant colon. Single sigmoid polyp removed ad described above.    ESOPHAGOGASTRODUODENOSCOPY  06/2008   Dr. Sinda Du ring s/p disruption, erosive reflux esophagitis, hh.    ESOPHAGOGASTRODUODENOSCOPY  09/2011   Schatki's ring s/p dilation, multiple 1-55mm  antral and bulbar erosions, bx positive for H.Pylori s/ treatment   TUBAL LIGATION     Patient Active Problem List   Diagnosis Date Noted   Vitamin D deficiency 04/20/2023   Schatzki's ring 01/03/2012    Class: History of   FH: colon cancer 09/16/2011   Esophageal dysphagia 09/16/2011   CATARACT, LEFT EYE 06/20/2009   CALLUSES, FEET, BILATERAL 06/20/2009   Hyperlipidemia associated with type 2 diabetes mellitus (HCC) 02/08/2008   Type 2 diabetes mellitus (HCC) 01/20/2007   Hypertension associated with diabetes (HCC) 10/28/2006   REFERRING PROVIDER: Sonny Masters, FNP   REFERRING DIAG: Left leg pain   THERAPY DIAG:  Pain in left leg  Difficulty in walking, not elsewhere classified  Muscle weakness (generalized)  Rationale for Evaluation and Treatment: Rehabilitation  ONSET DATE: December 2023   SUBJECTIVE:   SUBJECTIVE STATEMENT: Patient reports that she fell in December 2023 on he left side. She notes that it has been getting worse as it started in her lower leg and began going up her leg. She feels that her worst pain is in her left hip and knee. She has never had any pain like this before. She also fell again in July after having a mammogram.   PERTINENT HISTORY: Hypertension, diabetes, and arthritis PAIN:  Are you having pain? Yes: NPRS scale: 5-6/10 Pain location: left hip and knee Pain description: intermittent sharp pain  Aggravating factors: standing, walking, and laying on her left side Relieving factors: sitting down   PRECAUTIONS: Fall  RED FLAGS: None   WEIGHT BEARING RESTRICTIONS: No  FALLS:  Has patient fallen in last 6 months? Yes. Number of falls 2  LIVING ENVIRONMENT: Lives with: lives with their daughter Lives in: House/apartment Stairs: Yes: External: 1 steps; none; she has other steps at home, but she does not go up or down these steps anymore Has following equipment at home: None  OCCUPATION: retired  PLOF: Independent  PATIENT  GOALS: reduced pain and be able to go back to her gym program  NEXT MD VISIT: 10/21/23  OBJECTIVE:   COGNITION: Overall cognitive status: Within functional limits for tasks assessed     SENSATION: Patient reports no numbness or tingling  EDEMA:  No edema observed  PALPATION: TTP: left gastroc/soleus, quadriceps, hamstrings, popliteal fossa, IT band, TFL, and hip adductors  LOWER EXTREMITY ROM: PROM was attempted to be assessed but unable to be completed due to pain severity  Active ROM Right eval Left eval  Hip flexion 91 77; familiar pain  Hip extension    Hip abduction    Hip adduction    Hip internal rotation    Hip external rotation    Knee flexion    Knee extension    Ankle dorsiflexion    Ankle plantarflexion    Ankle inversion    Ankle eversion     (Blank rows = not tested)  LOWER EXTREMITY MMT:  MMT Right eval Left eval  Hip flexion 3+/5 4-/5; slight pain  Hip extension    Hip abduction    Hip adduction    Hip internal rotation    Hip external rotation    Knee flexion 5/5 4+/5  Knee extension 5/5 4+/5; familiar knee pain   Ankle dorsiflexion 4/5 4/5  Ankle plantarflexion    Ankle inversion    Ankle eversion     (Blank rows = not tested)  FUNCTIONAL TESTS:  5 times sit to stand: 23.59 seconds with upper extremity support from armrests Timed up and go (TUG): 21.64 seconds Required UE support for sit to stand transfers  GAIT: Assistive device utilized: None Level of assistance: Complete Independence Comments: step to pattern with decreased stance time and stride length on LLE    TODAY'S TREATMENT:                                                                                                                              DATE:     PATIENT EDUCATION:  Education details: POC, healing, prognosis, safety, and goals for therapy Person educated: Patient Education method: Explanation Education comprehension: verbalized understanding  HOME  EXERCISE PROGRAM:   ASSESSMENT:  CLINICAL IMPRESSION: Patient is a 89 y.o. female who was seen today for physical therapy evaluation and treatment  for left lower extremity pain secondary to a fall in December 2023. She presented with moderate to high pain severity and irritability with left lower extremity range of motion and palpation to her left lower extremity musculature reproducing her familiar pain. Passive range of motion was attempted to be assessed, but this was unable to be completed due to muscle guarding secondary to her high pain severity. Recommend that she continue with skilled physical therapy to address her impairments to return to her prior level of function.  OBJECTIVE IMPAIRMENTS: Abnormal gait, decreased activity tolerance, decreased balance, decreased mobility, difficulty walking, decreased ROM, decreased strength, hypomobility, impaired flexibility, impaired tone, and pain.   ACTIVITY LIMITATIONS: carrying, lifting, standing, squatting, sleeping, stairs, transfers, and locomotion level  PARTICIPATION LIMITATIONS: meal prep, cleaning, laundry, shopping, and community activity  PERSONAL FACTORS: Age, Past/current experiences, Time since onset of injury/illness/exacerbation, and 3+ comorbidities: Hypertension, diabetes, and arthritis  are also affecting patient's functional outcome.   REHAB POTENTIAL: Good  CLINICAL DECISION MAKING: Evolving/moderate complexity  EVALUATION COMPLEXITY: Moderate   GOALS: Goals reviewed with patient? Yes  SHORT TERM GOALS: Target date: 08/29/23 Patient will be independent with her initial HEP. Baseline: Goal status: INITIAL  2.  Patient will improve her left hip flexion to at least 85 degrees for improved hip mobility. Baseline:  Goal status: INITIAL  3.  Patient will improve her 5 times sit to stand time to 18 seconds or less for improved lower extremity power. Baseline:  Goal status: INITIAL  LONG TERM GOALS: Target date:  09/19/23  Patient will be independent with her advanced HEP. Baseline:  Goal status: INITIAL  2.  Patient will improve her 5 times sit to stand time to 15 seconds or less to reduce her fall risk. Baseline:  Goal status: INITIAL  3.  Patient will improve her timed up and go time to 15 seconds or less for improved functional mobility. Baseline:  Goal status: INITIAL  4.  Patient will be able to ambulate with no significant gait deviations. Baseline:  Goal status: INITIAL  5.  Patient will be able to complete her daily activities without her familiar left lower extremity pain exceeding 3/10. Baseline:  Goal status: INITIAL  PLAN:  PT FREQUENCY: 2x/week  PT DURATION: 6 weeks  PLANNED INTERVENTIONS: Therapeutic exercises, Therapeutic activity, Neuromuscular re-education, Balance training, Gait training, Patient/Family education, Self Care, Joint mobilization, Stair training, Electrical stimulation, Cryotherapy, Moist heat, Vasopneumatic device, Manual therapy, and Re-evaluation  PLAN FOR NEXT SESSION: NuStep, isometrics, manual therapy, and modalities as needed   Granville Lewis, PT 08/08/2023, 4:26 PM

## 2023-08-12 DIAGNOSIS — I10 Essential (primary) hypertension: Secondary | ICD-10-CM | POA: Diagnosis not present

## 2023-08-12 DIAGNOSIS — E119 Type 2 diabetes mellitus without complications: Secondary | ICD-10-CM | POA: Diagnosis not present

## 2023-08-15 ENCOUNTER — Ambulatory Visit: Payer: 59

## 2023-08-15 DIAGNOSIS — M6281 Muscle weakness (generalized): Secondary | ICD-10-CM

## 2023-08-15 DIAGNOSIS — R262 Difficulty in walking, not elsewhere classified: Secondary | ICD-10-CM | POA: Diagnosis not present

## 2023-08-15 DIAGNOSIS — M79605 Pain in left leg: Secondary | ICD-10-CM | POA: Diagnosis not present

## 2023-08-15 NOTE — Therapy (Signed)
OUTPATIENT PHYSICAL THERAPY LOWER EXTREMITY TREATMENT   Patient Name: Candice Brewer MRN: [id number] DOB:October 26, 1932, 89 y.o., female Today's Date: 08/15/2023  END OF SESSION:  PT End of Session - 08/15/23 1024     Visit Number 2    Number of Visits 12    Date for PT Re-Evaluation 10/28/23    PT Start Time 1015    PT Stop Time 1100    PT Time Calculation (min) 45 min    Activity Tolerance Patient tolerated treatment well    Behavior During Therapy [facility] for tasks assessed/performed              Past Medical History:  Diagnosis Date   Arthritis    Chest pain, unspecified    Constipation    DM (diabetes mellitus) (HCC)    Dry eyes    Esophageal stricture 09/2011   schatzki's ring   Fibroids    uterus   Glaucoma    Helicobacter pylori gastritis 2012   Treated   HTN (hypertension)    Hyperlipidemia    Restless legs syndrome (RLS)    Vitamin D deficiency    Past Surgical History:  Procedure Laterality Date   APPENDECTOMY     BREAST BIOPSY Left 03/20/2018   Procedure: BREAST BIOPSY WITH NEEDLE LOCALIZATION;  Surgeon: Lucretia Roers, MD;  Location: [facility];  Service: General;  Laterality: Left;   BREAST EXCISIONAL BIOPSY Left 2019   Atypical hyperplasia removed   CARDIOVASCULAR STRESS TEST  2009   CATARACT EXTRACTION  2010   right eye   CATARACT EXTRACTION  2012   left eye   COLONOSCOPY  08/2006   Dr. Elmer Ramp   COLONOSCOPY  10/13/2011   Normal. Next TCS 09/2016.   Procedure: COLONOSCOPY;  Surgeon: Corbin Ade, MD;  Location: [facility];  Service: Endoscopy;  Laterality: N/A;  8:15   COLONOSCOPY N/A 03/10/2015   RMR: External and anal canal hemorrhoids likely source of hematochezia. Redundant colon. Single sigmoid polyp removed ad described above.    ESOPHAGOGASTRODUODENOSCOPY  06/2008   Dr. Sinda Du ring s/p disruption, erosive reflux esophagitis, hh.    ESOPHAGOGASTRODUODENOSCOPY  09/2011   Schatki's ring s/p dilation, multiple 1-70mm  antral and bulbar erosions, bx positive for H.Pylori s/ treatment   TUBAL LIGATION     Patient Active Problem List   Diagnosis Date Noted   Vitamin D deficiency 04/20/2023   Schatzki's ring 01/03/2012    Class: History of   FH: colon cancer 09/16/2011   Esophageal dysphagia 09/16/2011   CATARACT, LEFT EYE 06/20/2009   CALLUSES, FEET, BILATERAL 06/20/2009   Hyperlipidemia associated with type 2 diabetes mellitus (HCC) 02/08/2008   Type 2 diabetes mellitus (HCC) 01/20/2007   Hypertension associated with diabetes (HCC) 10/28/2006   REFERRING PROVIDER: Sonny Masters, FNP   REFERRING DIAG: Left leg pain   THERAPY DIAG:  Pain in left leg  Difficulty in walking, not elsewhere classified  Muscle weakness (generalized)  Rationale for Evaluation and Treatment: Rehabilitation  ONSET DATE: December 2023   SUBJECTIVE:   SUBJECTIVE STATEMENT: Patient reports that she is hurting a little today.   PERTINENT HISTORY: Hypertension, diabetes, and arthritis PAIN:  Are you having pain? Yes: NPRS scale: 5-6/10 Pain location: left hip and knee Pain description: intermittent sharp pain  Aggravating factors: standing, walking, and laying on her left side Relieving factors: sitting down   PRECAUTIONS: Fall  RED FLAGS: None   WEIGHT BEARING RESTRICTIONS: No  FALLS:  Has patient fallen in  last 6 months? Yes. Number of falls 2  LIVING ENVIRONMENT: Lives with: lives with their daughter Lives in: House/apartment Stairs: Yes: External: 1 steps; none; she has other steps at home, but she does not go up or down these steps anymore Has following equipment at home: None  OCCUPATION: retired  PLOF: Independent  PATIENT GOALS: reduced pain and be able to go back to her gym program  NEXT MD VISIT: 10/21/23  OBJECTIVE: all objective measures were assessed at her initial evaluation on 08/08/23 unless otherwise noted  COGNITION: Overall cognitive status: Within functional limits for  tasks assessed     SENSATION: Patient reports no numbness or tingling  EDEMA:  No edema observed  PALPATION: TTP: left gastroc/soleus, quadriceps, hamstrings, popliteal fossa, IT band, TFL, and hip adductors  LOWER EXTREMITY ROM: PROM was attempted to be assessed but unable to be completed due to pain severity  Active ROM Right eval Left eval  Hip flexion 91 77; familiar pain  Hip extension    Hip abduction    Hip adduction    Hip internal rotation    Hip external rotation    Knee flexion    Knee extension    Ankle dorsiflexion    Ankle plantarflexion    Ankle inversion    Ankle eversion     (Blank rows = not tested)  LOWER EXTREMITY MMT:  MMT Right eval Left eval  Hip flexion 3+/5 4-/5; slight pain  Hip extension    Hip abduction    Hip adduction    Hip internal rotation    Hip external rotation    Knee flexion 5/5 4+/5  Knee extension 5/5 4+/5; familiar knee pain   Ankle dorsiflexion 4/5 4/5  Ankle plantarflexion    Ankle inversion    Ankle eversion     (Blank rows = not tested)  FUNCTIONAL TESTS:  5 times sit to stand: 23.59 seconds with upper extremity support from armrests Timed up and go (TUG): 21.64 seconds Required UE support for sit to stand transfers  GAIT: Assistive device utilized: None Level of assistance: Complete Independence Comments: step to pattern with decreased stance time and stride length on LLE    TODAY'S TREATMENT:                                                                                                                              DATE:                                     08/15/23 EXERCISE LOG  Exercise Repetitions and Resistance Comments  Nustep  L3 x 17 minutes   Supine hip ADD isometric  2 minutes w/ 5 second hold    Supine bent knee fall out  Limited by familiar pain  Modified to supine hip ABD isometric   Supine hip ABD isometric  2 minutes w/ 3  second hold    Double knee to chest  2 minutes  BLE supported on red  ball        Blank cell = exercise not performed today  Manual Therapy Soft Tissue Mobilization: left IT band, for reduced pain and tone     PATIENT EDUCATION:  Education details: POC, healing, prognosis, safety, and goals for therapy Person educated: Patient Education method: Explanation Education comprehension: verbalized understanding  HOME EXERCISE PROGRAM:   ASSESSMENT:  CLINICAL IMPRESSION: Patient was introduced to multiple new interventions for reduced pain and improved mobility. She required minimal cueing with today's new interventions for proper exercise performance. Manual therapy focused on soft tissue mobilization to her left IT band for reduced pain and tone with fair effectiveness. She reported feeling "a little better" upon the conclusion of treatment. She continues to require skilled physical therapy to address her remaining impairments to return to her prior level of function.   OBJECTIVE IMPAIRMENTS: Abnormal gait, decreased activity tolerance, decreased balance, decreased mobility, difficulty walking, decreased ROM, decreased strength, hypomobility, impaired flexibility, impaired tone, and pain.   ACTIVITY LIMITATIONS: carrying, lifting, standing, squatting, sleeping, stairs, transfers, and locomotion level  PARTICIPATION LIMITATIONS: meal prep, cleaning, laundry, shopping, and community activity  PERSONAL FACTORS: Age, Past/current experiences, Time since onset of injury/illness/exacerbation, and 3+ comorbidities: Hypertension, diabetes, and arthritis  are also affecting patient's functional outcome.   REHAB POTENTIAL: Good  CLINICAL DECISION MAKING: Evolving/moderate complexity  EVALUATION COMPLEXITY: Moderate   GOALS: Goals reviewed with patient? Yes  SHORT TERM GOALS: Target date: 08/29/23 Patient will be independent with her initial HEP. Baseline: Goal status: INITIAL  2.  Patient will improve her left hip flexion to at least 85 degrees for improved  hip mobility. Baseline:  Goal status: INITIAL  3.  Patient will improve her 5 times sit to stand time to 18 seconds or less for improved lower extremity power. Baseline:  Goal status: INITIAL  LONG TERM GOALS: Target date: 09/19/23  Patient will be independent with her advanced HEP. Baseline:  Goal status: INITIAL  2.  Patient will improve her 5 times sit to stand time to 15 seconds or less to reduce her fall risk. Baseline:  Goal status: INITIAL  3.  Patient will improve her timed up and go time to 15 seconds or less for improved functional mobility. Baseline:  Goal status: INITIAL  4.  Patient will be able to ambulate with no significant gait deviations. Baseline:  Goal status: INITIAL  5.  Patient will be able to complete her daily activities without her familiar left lower extremity pain exceeding 3/10. Baseline:  Goal status: INITIAL  PLAN:  PT FREQUENCY: 2x/week  PT DURATION: 6 weeks  PLANNED INTERVENTIONS: Therapeutic exercises, Therapeutic activity, Neuromuscular re-education, Balance training, Gait training, Patient/Family education, Self Care, Joint mobilization, Stair training, Electrical stimulation, Cryotherapy, Moist heat, Vasopneumatic device, Manual therapy, and Re-evaluation  PLAN FOR NEXT SESSION: NuStep, isometrics, manual therapy, and modalities as needed   Granville Lewis, PT 08/15/2023, 12:58 PM

## 2023-08-17 ENCOUNTER — Ambulatory Visit: Payer: 59

## 2023-08-17 ENCOUNTER — Telehealth: Payer: Self-pay | Admitting: *Deleted

## 2023-08-17 DIAGNOSIS — M6281 Muscle weakness (generalized): Secondary | ICD-10-CM | POA: Diagnosis not present

## 2023-08-17 DIAGNOSIS — M79605 Pain in left leg: Secondary | ICD-10-CM

## 2023-08-17 DIAGNOSIS — R262 Difficulty in walking, not elsewhere classified: Secondary | ICD-10-CM | POA: Diagnosis not present

## 2023-08-17 NOTE — Therapy (Signed)
OUTPATIENT PHYSICAL THERAPY LOWER EXTREMITY TREATMENT   Patient Name: Candice Brewer MRN: [id number] DOB:December 09, 1931, 89 y.o., female Today's Date: 08/17/2023  END OF SESSION:  PT End of Session - 08/17/23 1121     Visit Number 3    Number of Visits 12    Date for PT Re-Evaluation 10/28/23    PT Start Time 1100    PT Stop Time 1145    PT Time Calculation (min) 45 min    Activity Tolerance Patient tolerated treatment well    Behavior During Therapy [facility] for tasks assessed/performed               Past Medical History:  Diagnosis Date   Arthritis    Chest pain, unspecified    Constipation    DM (diabetes mellitus) (HCC)    Dry eyes    Esophageal stricture 09/2011   schatzki's ring   Fibroids    uterus   Glaucoma    Helicobacter pylori gastritis 2012   Treated   HTN (hypertension)    Hyperlipidemia    Restless legs syndrome (RLS)    Vitamin D deficiency    Past Surgical History:  Procedure Laterality Date   APPENDECTOMY     BREAST BIOPSY Left 03/20/2018   Procedure: BREAST BIOPSY WITH NEEDLE LOCALIZATION;  Surgeon: Lucretia Roers, MD;  Location: [facility];  Service: General;  Laterality: Left;   BREAST EXCISIONAL BIOPSY Left 2019   Atypical hyperplasia removed   CARDIOVASCULAR STRESS TEST  2009   CATARACT EXTRACTION  2010   right eye   CATARACT EXTRACTION  2012   left eye   COLONOSCOPY  08/2006   Dr. Elmer Ramp   COLONOSCOPY  10/13/2011   Normal. Next TCS 09/2016.   Procedure: COLONOSCOPY;  Surgeon: Corbin Ade, MD;  Location: [facility];  Service: Endoscopy;  Laterality: N/A;  8:15   COLONOSCOPY N/A 03/10/2015   RMR: External and anal canal hemorrhoids likely source of hematochezia. Redundant colon. Single sigmoid polyp removed ad described above.    ESOPHAGOGASTRODUODENOSCOPY  06/2008   Dr. Sinda Du ring s/p disruption, erosive reflux esophagitis, hh.    ESOPHAGOGASTRODUODENOSCOPY  09/2011   Schatki's ring s/p dilation, multiple 1-73mm  antral and bulbar erosions, bx positive for H.Pylori s/ treatment   TUBAL LIGATION     Patient Active Problem List   Diagnosis Date Noted   Vitamin D deficiency 04/20/2023   Schatzki's ring 01/03/2012    Class: History of   FH: colon cancer 09/16/2011   Esophageal dysphagia 09/16/2011   CATARACT, LEFT EYE 06/20/2009   CALLUSES, FEET, BILATERAL 06/20/2009   Hyperlipidemia associated with type 2 diabetes mellitus (HCC) 02/08/2008   Type 2 diabetes mellitus (HCC) 01/20/2007   Hypertension associated with diabetes (HCC) 10/28/2006   REFERRING PROVIDER: Sonny Masters, FNP   REFERRING DIAG: Left leg pain   THERAPY DIAG:  Pain in left leg  Difficulty in walking, not elsewhere classified  Muscle weakness (generalized)  Rationale for Evaluation and Treatment: Rehabilitation  ONSET DATE: December 2023   SUBJECTIVE:   SUBJECTIVE STATEMENT: Patient reports that she feels like she is moving a little better today, but she is still hurting some.   PERTINENT HISTORY: Hypertension, diabetes, and arthritis PAIN:  Are you having pain? Yes: NPRS scale: 5-6/10 Pain location: left hip and knee Pain description: intermittent sharp pain  Aggravating factors: standing, walking, and laying on her left side Relieving factors: sitting down   PRECAUTIONS: Fall  RED FLAGS: None  WEIGHT BEARING RESTRICTIONS: No  FALLS:  Has patient fallen in last 6 months? Yes. Number of falls 2  LIVING ENVIRONMENT: Lives with: lives with their daughter Lives in: House/apartment Stairs: Yes: External: 1 steps; none; she has other steps at home, but she does not go up or down these steps anymore Has following equipment at home: None  OCCUPATION: retired  PLOF: Independent  PATIENT GOALS: reduced pain and be able to go back to her gym program  NEXT MD VISIT: 10/21/23  OBJECTIVE: all objective measures were assessed at her initial evaluation on 08/08/23 unless otherwise noted  COGNITION: Overall  cognitive status: Within functional limits for tasks assessed     SENSATION: Patient reports no numbness or tingling  EDEMA:  No edema observed  PALPATION: TTP: left gastroc/soleus, quadriceps, hamstrings, popliteal fossa, IT band, TFL, and hip adductors  LOWER EXTREMITY ROM: PROM was attempted to be assessed but unable to be completed due to pain severity  Active ROM Right eval Left eval  Hip flexion 91 77; familiar pain  Hip extension    Hip abduction    Hip adduction    Hip internal rotation    Hip external rotation    Knee flexion    Knee extension    Ankle dorsiflexion    Ankle plantarflexion    Ankle inversion    Ankle eversion     (Blank rows = not tested)  LOWER EXTREMITY MMT:  MMT Right eval Left eval  Hip flexion 3+/5 4-/5; slight pain  Hip extension    Hip abduction    Hip adduction    Hip internal rotation    Hip external rotation    Knee flexion 5/5 4+/5  Knee extension 5/5 4+/5; familiar knee pain   Ankle dorsiflexion 4/5 4/5  Ankle plantarflexion    Ankle inversion    Ankle eversion     (Blank rows = not tested)  FUNCTIONAL TESTS:  5 times sit to stand: 23.59 seconds with upper extremity support from armrests Timed up and go (TUG): 21.64 seconds Required UE support for sit to stand transfers  GAIT: Assistive device utilized: None Level of assistance: Complete Independence Comments: step to pattern with decreased stance time and stride length on LLE    TODAY'S TREATMENT:                                                                                                                              DATE:                                     08/17/23 EXERCISE LOG  Exercise Repetitions and Resistance Comments  Nustep  L4 x 15 minutes   Seated hip ADD isometric  3 minutes w/ 5 seconds   Self STM with rolling pin    Seated heel raise  20 reps    Seated HS  curl  20 reps         Blank cell = exercise not performed today  Manual Therapy Soft  Tissue Mobilization: left hip adductors and IT band, for reduced pain and tone                                     08/15/23 EXERCISE LOG  Exercise Repetitions and Resistance Comments  Nustep  L3 x 17 minutes   Supine hip ADD isometric  2 minutes w/ 5 second hold    Supine bent knee fall out  Limited by familiar pain  Modified to supine hip ABD isometric   Supine hip ABD isometric  2 minutes w/ 3 second hold    Double knee to chest  2 minutes  BLE supported on red ball        Blank cell = exercise not performed today  Manual Therapy Soft Tissue Mobilization: left IT band, for reduced pain and tone     PATIENT EDUCATION:  Education details: POC, healing, prognosis, safety, and goals for therapy Person educated: Patient Education method: Explanation Education comprehension: verbalized understanding  HOME EXERCISE PROGRAM:   ASSESSMENT:  CLINICAL IMPRESSION: Patient was progressed with seated hamstring curls and heel raise for posterior chain engagement. She required minimal cueing with her home exercise program for proper exercise performance. Manual therapy focused on soft tissue mobilization to her left hip adductors and IT band for reduced pain and tone with good effectiveness. She reported feeling better upon the conclusion of treatment. She continues to require skilled physical therapy to address her remaining impairments to return to her prior level of function.   OBJECTIVE IMPAIRMENTS: Abnormal gait, decreased activity tolerance, decreased balance, decreased mobility, difficulty walking, decreased ROM, decreased strength, hypomobility, impaired flexibility, impaired tone, and pain.   ACTIVITY LIMITATIONS: carrying, lifting, standing, squatting, sleeping, stairs, transfers, and locomotion level  PARTICIPATION LIMITATIONS: meal prep, cleaning, laundry, shopping, and community activity  PERSONAL FACTORS: Age, Past/current experiences, Time since onset of  injury/illness/exacerbation, and 3+ comorbidities: Hypertension, diabetes, and arthritis  are also affecting patient's functional outcome.   REHAB POTENTIAL: Good  CLINICAL DECISION MAKING: Evolving/moderate complexity  EVALUATION COMPLEXITY: Moderate   GOALS: Goals reviewed with patient? Yes  SHORT TERM GOALS: Target date: 08/29/23 Patient will be independent with her initial HEP. Baseline: Goal status: INITIAL  2.  Patient will improve her left hip flexion to at least 85 degrees for improved hip mobility. Baseline:  Goal status: INITIAL  3.  Patient will improve her 5 times sit to stand time to 18 seconds or less for improved lower extremity power. Baseline:  Goal status: INITIAL  LONG TERM GOALS: Target date: 09/19/23  Patient will be independent with her advanced HEP. Baseline:  Goal status: INITIAL  2.  Patient will improve her 5 times sit to stand time to 15 seconds or less to reduce her fall risk. Baseline:  Goal status: INITIAL  3.  Patient will improve her timed up and go time to 15 seconds or less for improved functional mobility. Baseline:  Goal status: INITIAL  4.  Patient will be able to ambulate with no significant gait deviations. Baseline:  Goal status: INITIAL  5.  Patient will be able to complete her daily activities without her familiar left lower extremity pain exceeding 3/10. Baseline:  Goal status: INITIAL  PLAN:  PT FREQUENCY: 2x/week  PT DURATION: 6 weeks  PLANNED INTERVENTIONS:  Therapeutic exercises, Therapeutic activity, Neuromuscular re-education, Balance training, Gait training, Patient/Family education, Self Care, Joint mobilization, Stair training, Electrical stimulation, Cryotherapy, Moist heat, Vasopneumatic device, Manual therapy, and Re-evaluation  PLAN FOR NEXT SESSION: NuStep, isometrics, manual therapy, and modalities as needed   Granville Lewis, PT 08/17/2023, 12:33 PM

## 2023-08-17 NOTE — Telephone Encounter (Signed)
We discussed her recent falls

## 2023-08-17 NOTE — Telephone Encounter (Signed)
VM from [facility] rep, trying to help member get order for a wheelchair Pt was seen on 07/21/23, any discussion at this visit, if not pt will ntbs before her November appt for documentation purposes

## 2023-08-18 ENCOUNTER — Other Ambulatory Visit: Payer: Self-pay | Admitting: Family Medicine

## 2023-08-18 DIAGNOSIS — I10 Essential (primary) hypertension: Secondary | ICD-10-CM

## 2023-08-18 NOTE — Telephone Encounter (Signed)
Ok. She will need the wheelchair then.

## 2023-08-18 NOTE — Addendum Note (Signed)
Addended by: Julious Payer D on: 08/18/2023 04:28 PM   Modules accepted: Orders

## 2023-08-18 NOTE — Telephone Encounter (Signed)
Wants to accompany daughter to [employer] graduation, which will require a lot of walking and they will be there for 4-5 days. She started PT but still limping and left knee still very painful.

## 2023-08-22 ENCOUNTER — Telehealth: Payer: Self-pay | Admitting: Family Medicine

## 2023-08-22 DIAGNOSIS — Z0279 Encounter for issue of other medical certificate: Secondary | ICD-10-CM

## 2023-08-22 NOTE — Telephone Encounter (Signed)
Beverlyn Dacy dropped off HANDICAP forms to be completed and signed.  Form Fee Paid? (Y/N)    YES        If NO, form is placed on front office manager desk to hold until payment received. If YES, then form will be placed in the RX/HH Nurse Coordinators box for completion.  Form will not be processed until payment is received

## 2023-08-23 NOTE — Telephone Encounter (Signed)
Form has been completed. Placed up front in D folder. Left message making pt aware. Copy of placard sent to be scanned.

## 2023-08-24 ENCOUNTER — Ambulatory Visit: Payer: 59

## 2023-08-24 DIAGNOSIS — M6281 Muscle weakness (generalized): Secondary | ICD-10-CM

## 2023-08-24 DIAGNOSIS — M79605 Pain in left leg: Secondary | ICD-10-CM | POA: Diagnosis not present

## 2023-08-24 DIAGNOSIS — R262 Difficulty in walking, not elsewhere classified: Secondary | ICD-10-CM

## 2023-08-24 NOTE — Therapy (Signed)
OUTPATIENT PHYSICAL THERAPY LOWER EXTREMITY TREATMENT   Patient Name: Candice DAWDY MRN: [id number] DOB:06-24-1932, 89 y.o., female Today's Date: 08/24/2023  END OF SESSION:  PT End of Session - 08/24/23 1110     Visit Number 4    Number of Visits 12    Date for PT Re-Evaluation 10/28/23    PT Start Time 1100    PT Stop Time 1143    PT Time Calculation (min) 43 min    Activity Tolerance Patient tolerated treatment well    Behavior During Therapy [facility] for tasks assessed/performed                Past Medical History:  Diagnosis Date   Arthritis    Chest pain, unspecified    Constipation    DM (diabetes mellitus) (HCC)    Dry eyes    Esophageal stricture 09/2011   schatzki's ring   Fibroids    uterus   Glaucoma    Helicobacter pylori gastritis 2012   Treated   HTN (hypertension)    Hyperlipidemia    Restless legs syndrome (RLS)    Vitamin D deficiency    Past Surgical History:  Procedure Laterality Date   APPENDECTOMY     BREAST BIOPSY Left 03/20/2018   Procedure: BREAST BIOPSY WITH NEEDLE LOCALIZATION;  Surgeon: Lucretia Roers, MD;  Location: [facility];  Service: General;  Laterality: Left;   BREAST EXCISIONAL BIOPSY Left 2019   Atypical hyperplasia removed   CARDIOVASCULAR STRESS TEST  2009   CATARACT EXTRACTION  2010   right eye   CATARACT EXTRACTION  2012   left eye   COLONOSCOPY  08/2006   Dr. Elmer Ramp   COLONOSCOPY  10/13/2011   Normal. Next TCS 09/2016.   Procedure: COLONOSCOPY;  Surgeon: Corbin Ade, MD;  Location: [facility];  Service: Endoscopy;  Laterality: N/A;  8:15   COLONOSCOPY N/A 03/10/2015   RMR: External and anal canal hemorrhoids likely source of hematochezia. Redundant colon. Single sigmoid polyp removed ad described above.    ESOPHAGOGASTRODUODENOSCOPY  06/2008   Dr. Sinda Du ring s/p disruption, erosive reflux esophagitis, hh.    ESOPHAGOGASTRODUODENOSCOPY  09/2011   Schatki's ring s/p dilation, multiple 1-26mm  antral and bulbar erosions, bx positive for H.Pylori s/ treatment   TUBAL LIGATION     Patient Active Problem List   Diagnosis Date Noted   Vitamin D deficiency 04/20/2023   Schatzki's ring 01/03/2012    Class: History of   FH: colon cancer 09/16/2011   Esophageal dysphagia 09/16/2011   CATARACT, LEFT EYE 06/20/2009   CALLUSES, FEET, BILATERAL 06/20/2009   Hyperlipidemia associated with type 2 diabetes mellitus (HCC) 02/08/2008   Type 2 diabetes mellitus (HCC) 01/20/2007   Hypertension associated with diabetes (HCC) 10/28/2006   REFERRING PROVIDER: Sonny Masters, FNP   REFERRING DIAG: Left leg pain   THERAPY DIAG:  Pain in left leg  Difficulty in walking, not elsewhere classified  Muscle weakness (generalized)  Rationale for Evaluation and Treatment: Rehabilitation  ONSET DATE: December 2023   SUBJECTIVE:   SUBJECTIVE STATEMENT: Patient reports that she feels about the same today. However, she felt better after her last appointment.   PERTINENT HISTORY: Hypertension, diabetes, and arthritis PAIN:  Are you having pain? Yes: NPRS scale: no pain score provided/10 Pain location: left hip and knee Pain description: intermittent sharp pain  Aggravating factors: standing, walking, and laying on her left side Relieving factors: sitting down   PRECAUTIONS: Fall  RED FLAGS: None  WEIGHT BEARING RESTRICTIONS: No  FALLS:  Has patient fallen in last 6 months? Yes. Number of falls 2  LIVING ENVIRONMENT: Lives with: lives with their daughter Lives in: House/apartment Stairs: Yes: External: 1 steps; none; she has other steps at home, but she does not go up or down these steps anymore Has following equipment at home: None  OCCUPATION: retired  PLOF: Independent  PATIENT GOALS: reduced pain and be able to go back to her gym program  NEXT MD VISIT: 10/21/23  OBJECTIVE: all objective measures were assessed at her initial evaluation on 08/08/23 unless otherwise  noted  COGNITION: Overall cognitive status: Within functional limits for tasks assessed     SENSATION: Patient reports no numbness or tingling  EDEMA:  No edema observed  PALPATION: TTP: left gastroc/soleus, quadriceps, hamstrings, popliteal fossa, IT band, TFL, and hip adductors  LOWER EXTREMITY ROM: PROM was attempted to be assessed but unable to be completed due to pain severity  Active ROM Right eval Left eval  Hip flexion 91 77; familiar pain  Hip extension    Hip abduction    Hip adduction    Hip internal rotation    Hip external rotation    Knee flexion    Knee extension    Ankle dorsiflexion    Ankle plantarflexion    Ankle inversion    Ankle eversion     (Blank rows = not tested)  LOWER EXTREMITY MMT:  MMT Right eval Left eval  Hip flexion 3+/5 4-/5; slight pain  Hip extension    Hip abduction    Hip adduction    Hip internal rotation    Hip external rotation    Knee flexion 5/5 4+/5  Knee extension 5/5 4+/5; familiar knee pain   Ankle dorsiflexion 4/5 4/5  Ankle plantarflexion    Ankle inversion    Ankle eversion     (Blank rows = not tested)  FUNCTIONAL TESTS:  5 times sit to stand: 23.59 seconds with upper extremity support from armrests Timed up and go (TUG): 21.64 seconds Required UE support for sit to stand transfers  GAIT: Assistive device utilized: None Level of assistance: Complete Independence Comments: step to pattern with decreased stance time and stride length on LLE    TODAY'S TREATMENT:                                                                                                                              DATE:                                     08/24/23 EXERCISE LOG  Exercise Repetitions and Resistance Comments  Nustep  L4 x 15 minutes    Seated clams  Green t-band x 3 minutes   Seated hip ADD isometric 3 minutes w/ 5 second hold    Seated HS curl  Green t-band x  10 reps  LLE only        Blank cell = exercise not  performed today  Manual Therapy Soft Tissue Mobilization: left TFL and IT band, for reduced pain and tone                                     08/17/23 EXERCISE LOG  Exercise Repetitions and Resistance Comments  Nustep  L4 x 15 minutes   Seated hip ADD isometric  3 minutes w/ 5 seconds   Self STM with rolling pin    Seated heel raise  20 reps    Seated HS curl  20 reps         Blank cell = exercise not performed today  Manual Therapy Soft Tissue Mobilization: left hip adductors and IT band, for reduced pain and tone                                     08/15/23 EXERCISE LOG  Exercise Repetitions and Resistance Comments  Nustep  L3 x 17 minutes   Supine hip ADD isometric  2 minutes w/ 5 second hold    Supine bent knee fall out  Limited by familiar pain  Modified to supine hip ABD isometric   Supine hip ABD isometric  2 minutes w/ 3 second hold    Double knee to chest  2 minutes  BLE supported on red ball        Blank cell = exercise not performed today  Manual Therapy Soft Tissue Mobilization: left IT band, for reduced pain and tone     PATIENT EDUCATION:  Education details: the importance of her HEP  Person educated: Patient Education method: Explanation Education comprehension: verbalized understanding  HOME EXERCISE PROGRAM:   ASSESSMENT:  CLINICAL IMPRESSION: Patient was introduced to seated clams for piriformis engagement needed for improved mobility. She required moderate verbal and visual cueing with seated hamstring curls for proper exercise performance to limit hip flexor engagement while isolating hamstring engagement. She experienced a mild increase in her familiar symptoms with hamstring curls, but this was able to be significantly reduced with manual therapy. Manual therapy focused on soft tissue mobilization to the left iliotibial band and tensor fascia lata with good results. She was able to demonstrate improved gait speed and stride length upon the conclusion of  treatment. She reported that she felt better upon the conclusion of today's interventions. She continues to require skilled physical therapy to address her remaining impairments to return to her prior level of function.   OBJECTIVE IMPAIRMENTS: Abnormal gait, decreased activity tolerance, decreased balance, decreased mobility, difficulty walking, decreased ROM, decreased strength, hypomobility, impaired flexibility, impaired tone, and pain.   ACTIVITY LIMITATIONS: carrying, lifting, standing, squatting, sleeping, stairs, transfers, and locomotion level  PARTICIPATION LIMITATIONS: meal prep, cleaning, laundry, shopping, and community activity  PERSONAL FACTORS: Age, Past/current experiences, Time since onset of injury/illness/exacerbation, and 3+ comorbidities: Hypertension, diabetes, and arthritis  are also affecting patient's functional outcome.   REHAB POTENTIAL: Good  CLINICAL DECISION MAKING: Evolving/moderate complexity  EVALUATION COMPLEXITY: Moderate   GOALS: Goals reviewed with patient? Yes  SHORT TERM GOALS: Target date: 08/29/23 Patient will be independent with her initial HEP. Baseline: Goal status: INITIAL  2.  Patient will improve her left hip flexion to at least 85 degrees for improved hip mobility. Baseline:  Goal status: INITIAL  3.  Patient will improve her 5 times sit to stand time to 18 seconds or less for improved lower extremity power. Baseline:  Goal status: INITIAL  LONG TERM GOALS: Target date: 09/19/23  Patient will be independent with her advanced HEP. Baseline:  Goal status: INITIAL  2.  Patient will improve her 5 times sit to stand time to 15 seconds or less to reduce her fall risk. Baseline:  Goal status: INITIAL  3.  Patient will improve her timed up and go time to 15 seconds or less for improved functional mobility. Baseline:  Goal status: INITIAL  4.  Patient will be able to ambulate with no significant gait deviations. Baseline:  Goal  status: INITIAL  5.  Patient will be able to complete her daily activities without her familiar left lower extremity pain exceeding 3/10. Baseline:  Goal status: INITIAL  PLAN:  PT FREQUENCY: 2x/week  PT DURATION: 6 weeks  PLANNED INTERVENTIONS: Therapeutic exercises, Therapeutic activity, Neuromuscular re-education, Balance training, Gait training, Patient/Family education, Self Care, Joint mobilization, Stair training, Electrical stimulation, Cryotherapy, Moist heat, Vasopneumatic device, Manual therapy, and Re-evaluation  PLAN FOR NEXT SESSION: NuStep, isometrics, manual therapy, and modalities as needed   Granville Lewis, PT 08/24/2023, 12:45 PM

## 2023-08-28 DIAGNOSIS — I10 Essential (primary) hypertension: Secondary | ICD-10-CM | POA: Diagnosis not present

## 2023-08-28 DIAGNOSIS — E119 Type 2 diabetes mellitus without complications: Secondary | ICD-10-CM | POA: Diagnosis not present

## 2023-08-29 DIAGNOSIS — E119 Type 2 diabetes mellitus without complications: Secondary | ICD-10-CM | POA: Diagnosis not present

## 2023-08-29 DIAGNOSIS — I10 Essential (primary) hypertension: Secondary | ICD-10-CM | POA: Diagnosis not present

## 2023-08-30 ENCOUNTER — Telehealth: Payer: Self-pay | Admitting: Family Medicine

## 2023-08-30 DIAGNOSIS — M25562 Pain in left knee: Secondary | ICD-10-CM

## 2023-08-30 NOTE — Telephone Encounter (Signed)
REFERRAL REQUEST Telephone Note  Have you been seen at our office for this problem? YES (Advise that they may need an appointment with their PCP before a referral can be done)  Reason for Referral: Left knee pain Referral discussed with patient: yes  Best contact number of patient for referral team: [phone]    Has patient been seen by a specialist for this issue before: NO  Patient provider preference for referral: NO Patient location preference for referral: [place]   Patient notified that referrals can take up to a week or longer to process. If they haven't heard anything within a week they should call back and speak with the referral department.

## 2023-08-31 ENCOUNTER — Ambulatory Visit: Payer: 59 | Attending: Family Medicine

## 2023-08-31 DIAGNOSIS — M79605 Pain in left leg: Secondary | ICD-10-CM | POA: Insufficient documentation

## 2023-08-31 DIAGNOSIS — R262 Difficulty in walking, not elsewhere classified: Secondary | ICD-10-CM | POA: Diagnosis not present

## 2023-08-31 DIAGNOSIS — M6281 Muscle weakness (generalized): Secondary | ICD-10-CM | POA: Insufficient documentation

## 2023-08-31 NOTE — Telephone Encounter (Signed)
Pt would like to go to ortho since she is doing PT and is still in pain. I have set the referral up already and pended the order. If you are ok with this just sign it please.

## 2023-08-31 NOTE — Telephone Encounter (Signed)
Please Review

## 2023-08-31 NOTE — Therapy (Signed)
OUTPATIENT PHYSICAL THERAPY LOWER EXTREMITY TREATMENT   Patient Name: Candice Brewer MRN: [id number] DOB:1932/06/30, 89 y.o., female Today's Date: 08/31/2023  END OF SESSION:  PT End of Session - 08/31/23 1108     Visit Number 5    Number of Visits 12    Date for PT Re-Evaluation 10/28/23    PT Start Time 1101    PT Stop Time 1145    PT Time Calculation (min) 44 min    Activity Tolerance Patient tolerated treatment well    Behavior During Therapy [facility] for tasks assessed/performed                 Past Medical History:  Diagnosis Date   Arthritis    Chest pain, unspecified    Constipation    DM (diabetes mellitus) (HCC)    Dry eyes    Esophageal stricture 09/2011   schatzki's ring   Fibroids    uterus   Glaucoma    Helicobacter pylori gastritis 2012   Treated   HTN (hypertension)    Hyperlipidemia    Restless legs syndrome (RLS)    Vitamin D deficiency    Past Surgical History:  Procedure Laterality Date   APPENDECTOMY     BREAST BIOPSY Left 03/20/2018   Procedure: BREAST BIOPSY WITH NEEDLE LOCALIZATION;  Surgeon: Lucretia Roers, MD;  Location: [facility];  Service: General;  Laterality: Left;   BREAST EXCISIONAL BIOPSY Left 2019   Atypical hyperplasia removed   CARDIOVASCULAR STRESS TEST  2009   CATARACT EXTRACTION  2010   right eye   CATARACT EXTRACTION  2012   left eye   COLONOSCOPY  08/2006   Dr. Elmer Ramp   COLONOSCOPY  10/13/2011   Normal. Next TCS 09/2016.   Procedure: COLONOSCOPY;  Surgeon: Corbin Ade, MD;  Location: [facility];  Service: Endoscopy;  Laterality: N/A;  8:15   COLONOSCOPY N/A 03/10/2015   RMR: External and anal canal hemorrhoids likely source of hematochezia. Redundant colon. Single sigmoid polyp removed ad described above.    ESOPHAGOGASTRODUODENOSCOPY  06/2008   Dr. Sinda Du ring s/p disruption, erosive reflux esophagitis, hh.    ESOPHAGOGASTRODUODENOSCOPY  09/2011   Schatki's ring s/p dilation, multiple  1-42mm antral and bulbar erosions, bx positive for H.Pylori s/ treatment   TUBAL LIGATION     Patient Active Problem List   Diagnosis Date Noted   Vitamin D deficiency 04/20/2023   Schatzki's ring 01/03/2012    Class: History of   FH: colon cancer 09/16/2011   Esophageal dysphagia 09/16/2011   CATARACT, LEFT EYE 06/20/2009   CALLUSES, FEET, BILATERAL 06/20/2009   Hyperlipidemia associated with type 2 diabetes mellitus (HCC) 02/08/2008   Type 2 diabetes mellitus (HCC) 01/20/2007   Hypertension associated with diabetes (HCC) 10/28/2006   REFERRING PROVIDER: Sonny Masters, FNP   REFERRING DIAG: Left leg pain   THERAPY DIAG:  Pain in left leg  Difficulty in walking, not elsewhere classified  Muscle weakness (generalized)  Rationale for Evaluation and Treatment: Rehabilitation  ONSET DATE: December 2023   SUBJECTIVE:   SUBJECTIVE STATEMENT: Patient reports that she is hurting some today, but she was able to go to her exercise class yesterday which felt good.   PERTINENT HISTORY: Hypertension, diabetes, and arthritis PAIN:  Are you having pain? Yes: NPRS scale: moderate/10 Pain location: left hip and knee Pain description: intermittent sharp pain  Aggravating factors: standing, walking, and laying on her left side Relieving factors: sitting down   PRECAUTIONS: Fall  RED FLAGS: None   WEIGHT BEARING RESTRICTIONS: No  FALLS:  Has patient fallen in last 6 months? Yes. Number of falls 2  LIVING ENVIRONMENT: Lives with: lives with their daughter Lives in: House/apartment Stairs: Yes: External: 1 steps; none; she has other steps at home, but she does not go up or down these steps anymore Has following equipment at home: None  OCCUPATION: retired  PLOF: Independent  PATIENT GOALS: reduced pain and be able to go back to her gym program  NEXT MD VISIT: 10/21/23  OBJECTIVE: all objective measures were assessed at her initial evaluation on 08/08/23 unless otherwise  noted  COGNITION: Overall cognitive status: Within functional limits for tasks assessed     SENSATION: Patient reports no numbness or tingling  EDEMA:  No edema observed  PALPATION: TTP: left gastroc/soleus, quadriceps, hamstrings, popliteal fossa, IT band, TFL, and hip adductors  LOWER EXTREMITY ROM: PROM was attempted to be assessed but unable to be completed due to pain severity  Active ROM Right eval Left eval  Hip flexion 91 77; familiar pain  Hip extension    Hip abduction    Hip adduction    Hip internal rotation    Hip external rotation    Knee flexion    Knee extension    Ankle dorsiflexion    Ankle plantarflexion    Ankle inversion    Ankle eversion     (Blank rows = not tested)  LOWER EXTREMITY MMT:  MMT Right eval Left eval  Hip flexion 3+/5 4-/5; slight pain  Hip extension    Hip abduction    Hip adduction    Hip internal rotation    Hip external rotation    Knee flexion 5/5 4+/5  Knee extension 5/5 4+/5; familiar knee pain   Ankle dorsiflexion 4/5 4/5  Ankle plantarflexion    Ankle inversion    Ankle eversion     (Blank rows = not tested)  FUNCTIONAL TESTS:  5 times sit to stand: 23.59 seconds with upper extremity support from armrests Timed up and go (TUG): 21.64 seconds Required UE support for sit to stand transfers  GAIT: Assistive device utilized: None Level of assistance: Complete Independence Comments: step to pattern with decreased stance time and stride length on LLE    TODAY'S TREATMENT:                                                                                                                              DATE:                                     08/31/23 EXERCISE LOG  Exercise Repetitions and Resistance Comments  Nustep  L4 x 16 minutes    Rocker board 3 minutes  BUE support   Marching on foam  3 minutes  BUE support  Seated hip ADD isometric  2.5  minutes w/ 5 second hold        Blank cell = exercise not performed  today  Manual Therapy Soft Tissue Mobilization: left TFL and IT band, for reduced pain and tone                                     08/24/23 EXERCISE LOG  Exercise Repetitions and Resistance Comments  Nustep  L4 x 15 minutes    Seated clams  Green t-band x 3 minutes   Seated hip ADD isometric 3 minutes w/ 5 second hold    Seated HS curl  Green t-band x 10 reps  LLE only        Blank cell = exercise not performed today  Manual Therapy Soft Tissue Mobilization: left TFL and IT band, for reduced pain and tone                                     08/17/23 EXERCISE LOG  Exercise Repetitions and Resistance Comments  Nustep  L4 x 15 minutes   Seated hip ADD isometric  3 minutes w/ 5 seconds   Self STM with rolling pin    Seated heel raise  20 reps    Seated HS curl  20 reps         Blank cell = exercise not performed today  Manual Therapy Soft Tissue Mobilization: left hip adductors and IT band, for reduced pain and tone    PATIENT EDUCATION:  Education details: the importance of her HEP  Person educated: Patient Education method: Explanation Education comprehension: verbalized understanding  HOME EXERCISE PROGRAM:   ASSESSMENT:  CLINICAL IMPRESSION: Patient was introduced to multiple new standing interventions for improved lower extremity stability on unstable terrain. She required minimal cueing with these interventions for increased demand on her lower extremities. She experienced a mild increase in left hip soreness with marching on foam, but this was able to be significantly reduced with soft tissue mobilization to her left tensor fascia lata and iliotibial band. She reported feeling better upon the conclusion of treatment. She continues to require skilled physical therapy to address her remaining impairments to return to her prior level of function.   OBJECTIVE IMPAIRMENTS: Abnormal gait, decreased activity tolerance, decreased balance, decreased mobility, difficulty walking,  decreased ROM, decreased strength, hypomobility, impaired flexibility, impaired tone, and pain.   ACTIVITY LIMITATIONS: carrying, lifting, standing, squatting, sleeping, stairs, transfers, and locomotion level  PARTICIPATION LIMITATIONS: meal prep, cleaning, laundry, shopping, and community activity  PERSONAL FACTORS: Age, Past/current experiences, Time since onset of injury/illness/exacerbation, and 3+ comorbidities: Hypertension, diabetes, and arthritis  are also affecting patient's functional outcome.   REHAB POTENTIAL: Good  CLINICAL DECISION MAKING: Evolving/moderate complexity  EVALUATION COMPLEXITY: Moderate   GOALS: Goals reviewed with patient? Yes  SHORT TERM GOALS: Target date: 08/29/23 Patient will be independent with her initial HEP. Baseline: Goal status: MET  2.  Patient will improve her left hip flexion to at least 85 degrees for improved hip mobility. Baseline:  Goal status: INITIAL  3.  Patient will improve her 5 times sit to stand time to 18 seconds or less for improved lower extremity power. Baseline:  Goal status: INITIAL  LONG TERM GOALS: Target date: 09/19/23  Patient will be independent with her advanced HEP. Baseline:  Goal status: ON GOING  2.  Patient will improve her 5 times sit to stand time to 15 seconds or less to reduce her fall risk. Baseline:  Goal status: INITIAL  3.  Patient will improve her timed up and go time to 15 seconds or less for improved functional mobility. Baseline:  Goal status: INITIAL  4.  Patient will be able to ambulate with no significant gait deviations. Baseline:  Goal status: ON GOING  5.  Patient will be able to complete her daily activities without her familiar left lower extremity pain exceeding 3/10. Baseline:  Goal status: ON GOING  PLAN:  PT FREQUENCY: 2x/week  PT DURATION: 6 weeks  PLANNED INTERVENTIONS: Therapeutic exercises, Therapeutic activity, Neuromuscular re-education, Balance training, Gait  training, Patient/Family education, Self Care, Joint mobilization, Stair training, Electrical stimulation, Cryotherapy, Moist heat, Vasopneumatic device, Manual therapy, and Re-evaluation  PLAN FOR NEXT SESSION: NuStep, isometrics, manual therapy, and modalities as needed   Granville Lewis, PT 08/31/2023, 12:18 PM

## 2023-08-31 NOTE — Telephone Encounter (Signed)
Does she want to see ortho as she has been referred to PT for this?

## 2023-09-06 ENCOUNTER — Ambulatory Visit (INDEPENDENT_AMBULATORY_CARE_PROVIDER_SITE_OTHER): Payer: 59 | Admitting: Physician Assistant

## 2023-09-06 ENCOUNTER — Other Ambulatory Visit (INDEPENDENT_AMBULATORY_CARE_PROVIDER_SITE_OTHER): Payer: 59

## 2023-09-06 ENCOUNTER — Encounter: Payer: Self-pay | Admitting: Physician Assistant

## 2023-09-06 DIAGNOSIS — M25552 Pain in left hip: Secondary | ICD-10-CM

## 2023-09-06 DIAGNOSIS — M25562 Pain in left knee: Secondary | ICD-10-CM | POA: Diagnosis not present

## 2023-09-06 NOTE — Progress Notes (Signed)
Office Visit Note   Patient: Candice Brewer           Date of Birth: [birth year]           MRN: [id number] Visit Date: 09/06/2023              Requested by: Sonny Masters, FNP [address] [place],  [place] [address] PCP: Sonny Masters, FNP   Assessment & Plan: Visit Diagnoses:  1. Left knee pain, unspecified chronicity   2. Pain of left hip   3. Pain in left hip     Plan: Patient is a pleasant active 89 year old woman who comes in today with her daughters.  She normally independently ambulates without any assistive devices.  Since December she has had a series of falls which is caused her to have increasing pain in her left hip and thigh.  The first fall was down some stairs when she tripped.  She says that increasingly some of the other 2 or 3 falls have been secondary to having pain and then her leg giving out.  She denies any paresthesias.  She has a lot of stiffness and pain with manipulation of her left hip.  I think most of this is coming from her left hip.  She also has some pain in her knee but again her exam is benign and her x-rays do not show any significant arthritis.  Her x-ray of her hip shows advanced degenerative changes.  She is doing physical therapy I think this is appropriate.  I have also recommended referral to Dr. Shon Baton for a diagnostic and hopefully therapeutic steroid injection into her left hip she is diabetic but her most recent A1c was 6.5  Follow-Up Instructions: With Dr. Shon Baton for left hip injection  Orders:  Orders Placed This Encounter  Procedures   XR KNEE 3 VIEW LEFT   XR HIP UNILAT W OR W/O PELVIS 2-3 VIEWS LEFT   No orders of the defined types were placed in this encounter.     Procedures: No procedures performed   Clinical Data: No additional findings.   Subjective: Chief Complaint  Patient presents with   Left Knee - Pain    HPI  Patient is a pleasant 89 year old woman who sustained a series of falls since 2023.  This began  when she missed a step going down some stairs fell on her left side.  2 days later she had another fall getting out of a bathtub and hit her head.  She was fully evaluated in the emergency room.  2 months ago she had a fall in a parking lot when she was going to get her mammogram.  No history of falls prior to December.  Complains of left knee and left hip pain Review of Systems  All other systems reviewed and are negative.    Objective: Vital Signs: There were no vitals taken for this visit.  Physical Exam Constitutional:      Appearance: Normal appearance.  Pulmonary:     Effort: Pulmonary effort is normal.  Skin:    General: Skin is warm and dry.  Neurological:     General: No focal deficit present.     Mental Status: She is alert and oriented to person, place, and time.  Psychiatric:        Mood and Affect: Mood normal.        Behavior: Behavior normal.     Ortho Exam Examination of her left hip she has no  redness no erythema she has quite a bit of pain and stiffness with internal/external rotation.  She is neurovascularly intact sensation is intact she is able to sustain a straight leg raise.  No minimal crepitus in her knee no effusion no erythema no particular tenderness anywhere compartments are soft and nontender negative Denna Haggard' sign Specialty Comments:  No specialty comments available.  Imaging: No results found.   PMFS History: Patient Active Problem List   Diagnosis Date Noted   Pain in left hip 09/06/2023   Pain in left knee 09/06/2023   Vitamin D deficiency 04/20/2023   Schatzki's ring 01/03/2012    Class: History of   FH: colon cancer 09/16/2011   Esophageal dysphagia 09/16/2011   CATARACT, LEFT EYE 06/20/2009   CALLUSES, FEET, BILATERAL 06/20/2009   Hyperlipidemia associated with type 2 diabetes mellitus (HCC) 02/08/2008   Type 2 diabetes mellitus (HCC) 01/20/2007   Hypertension associated with diabetes (HCC) 10/28/2006   Past Medical History:   Diagnosis Date   Arthritis    Chest pain, unspecified    Constipation    DM (diabetes mellitus) (HCC)    Dry eyes    Esophageal stricture 09/2011   schatzki's ring   Fibroids    uterus   Glaucoma    Helicobacter pylori gastritis 2012   Treated   HTN (hypertension)    Hyperlipidemia    Restless legs syndrome (RLS)    Vitamin D deficiency     Family History  Problem Relation Age of Onset   Prostate cancer Father    Colon cancer Father        >age 33   Cancer Paternal Grandmother        metastatic at time of diagnosis, primary unknown   Cancer Paternal Aunt        metastatic at time of diagnosisi, primary unknown    Past Surgical History:  Procedure Laterality Date   APPENDECTOMY     BREAST BIOPSY Left 03/20/2018   Procedure: BREAST BIOPSY WITH NEEDLE LOCALIZATION;  Surgeon: Lucretia Roers, MD;  Location: [facility];  Service: General;  Laterality: Left;   BREAST EXCISIONAL BIOPSY Left 2019   Atypical hyperplasia removed   CARDIOVASCULAR STRESS TEST  2009   CATARACT EXTRACTION  2010   right eye   CATARACT EXTRACTION  2012   left eye   COLONOSCOPY  08/2006   Dr. Elmer Ramp   COLONOSCOPY  10/13/2011   Normal. Next TCS 09/2016.   Procedure: COLONOSCOPY;  Surgeon: Corbin Ade, MD;  Location: [facility];  Service: Endoscopy;  Laterality: N/A;  8:15   COLONOSCOPY N/A 03/10/2015   RMR: External and anal canal hemorrhoids likely source of hematochezia. Redundant colon. Single sigmoid polyp removed ad described above.    ESOPHAGOGASTRODUODENOSCOPY  06/2008   Dr. Sinda Du ring s/p disruption, erosive reflux esophagitis, hh.    ESOPHAGOGASTRODUODENOSCOPY  09/2011   Schatki's ring s/p dilation, multiple 1-51mm antral and bulbar erosions, bx positive for H.Pylori s/ treatment   TUBAL LIGATION     Social History   Occupational History   Occupation: retired Psychologist, occupational  Tobacco Use   Smoking status: Never   Smokeless tobacco: Never   Tobacco comments:    quit  about 35+ yrs  Vaping Use   Vaping status: Never Used  Substance and Sexual Activity   Alcohol use: No    Alcohol/week: 0.0 standard drinks of alcohol   Drug use: No   Sexual activity: Not Currently

## 2023-09-07 ENCOUNTER — Ambulatory Visit: Payer: 59

## 2023-09-15 ENCOUNTER — Ambulatory Visit: Payer: 59

## 2023-09-15 DIAGNOSIS — M6281 Muscle weakness (generalized): Secondary | ICD-10-CM | POA: Diagnosis not present

## 2023-09-15 DIAGNOSIS — R262 Difficulty in walking, not elsewhere classified: Secondary | ICD-10-CM | POA: Diagnosis not present

## 2023-09-15 DIAGNOSIS — M79605 Pain in left leg: Secondary | ICD-10-CM

## 2023-09-15 NOTE — Therapy (Signed)
OUTPATIENT PHYSICAL THERAPY LOWER EXTREMITY TREATMENT   Patient Name: Candice Brewer MRN: [id number] DOB:1932-03-22, 89 y.o., female Today's Date: 09/15/2023  END OF SESSION:  PT End of Session - 09/15/23 1400     Visit Number 6    Number of Visits 12    Date for PT Re-Evaluation 10/28/23    PT Start Time 1345    PT Stop Time 1430    PT Time Calculation (min) 45 min    Activity Tolerance Patient tolerated treatment well    Behavior During Therapy [facility] for tasks assessed/performed                  Past Medical History:  Diagnosis Date   Arthritis    Chest pain, unspecified    Constipation    DM (diabetes mellitus) (HCC)    Dry eyes    Esophageal stricture 09/2011   schatzki's ring   Fibroids    uterus   Glaucoma    Helicobacter pylori gastritis 2012   Treated   HTN (hypertension)    Hyperlipidemia    Restless legs syndrome (RLS)    Vitamin D deficiency    Past Surgical History:  Procedure Laterality Date   APPENDECTOMY     BREAST BIOPSY Left 03/20/2018   Procedure: BREAST BIOPSY WITH NEEDLE LOCALIZATION;  Surgeon: Lucretia Roers, MD;  Location: [facility];  Service: General;  Laterality: Left;   BREAST EXCISIONAL BIOPSY Left 2019   Atypical hyperplasia removed   CARDIOVASCULAR STRESS TEST  2009   CATARACT EXTRACTION  2010   right eye   CATARACT EXTRACTION  2012   left eye   COLONOSCOPY  08/2006   Dr. Elmer Ramp   COLONOSCOPY  10/13/2011   Normal. Next TCS 09/2016.   Procedure: COLONOSCOPY;  Surgeon: Corbin Ade, MD;  Location: [facility];  Service: Endoscopy;  Laterality: N/A;  8:15   COLONOSCOPY N/A 03/10/2015   RMR: External and anal canal hemorrhoids likely source of hematochezia. Redundant colon. Single sigmoid polyp removed ad described above.    ESOPHAGOGASTRODUODENOSCOPY  06/2008   Dr. Sinda Du ring s/p disruption, erosive reflux esophagitis, hh.    ESOPHAGOGASTRODUODENOSCOPY  09/2011   Schatki's ring s/p dilation, multiple  1-48mm antral and bulbar erosions, bx positive for H.Pylori s/ treatment   TUBAL LIGATION     Patient Active Problem List   Diagnosis Date Noted   Pain in left hip 09/06/2023   Pain in left knee 09/06/2023   Vitamin D deficiency 04/20/2023   Schatzki's ring 01/03/2012    Class: History of   FH: colon cancer 09/16/2011   Esophageal dysphagia 09/16/2011   CATARACT, LEFT EYE 06/20/2009   CALLUSES, FEET, BILATERAL 06/20/2009   Hyperlipidemia associated with type 2 diabetes mellitus (HCC) 02/08/2008   Type 2 diabetes mellitus (HCC) 01/20/2007   Hypertension associated with diabetes (HCC) 10/28/2006   REFERRING PROVIDER: Sonny Masters, FNP   REFERRING DIAG: Left leg pain   THERAPY DIAG:  Pain in left leg  Difficulty in walking, not elsewhere classified  Muscle weakness (generalized)  Rationale for Evaluation and Treatment: Rehabilitation  ONSET DATE: December 2023   SUBJECTIVE:   SUBJECTIVE STATEMENT: Patient reports that she is hurting a little, but not as bad as she has been.   PERTINENT HISTORY: Hypertension, diabetes, and arthritis PAIN:  Are you having pain? Yes: NPRS scale: moderate/10 Pain location: left hip and knee Pain description: intermittent sharp pain  Aggravating factors: standing, walking, and laying on her left side  Relieving factors: sitting down   PRECAUTIONS: Fall  RED FLAGS: None   WEIGHT BEARING RESTRICTIONS: No  FALLS:  Has patient fallen in last 6 months? Yes. Number of falls 2  LIVING ENVIRONMENT: Lives with: lives with their daughter Lives in: House/apartment Stairs: Yes: External: 1 steps; none; she has other steps at home, but she does not go up or down these steps anymore Has following equipment at home: None  OCCUPATION: retired  PLOF: Independent  PATIENT GOALS: reduced pain and be able to go back to her gym program  NEXT MD VISIT: 10/21/23  OBJECTIVE: all objective measures were assessed at her initial evaluation on  08/08/23 unless otherwise noted  COGNITION: Overall cognitive status: Within functional limits for tasks assessed     SENSATION: Patient reports no numbness or tingling  EDEMA:  No edema observed  PALPATION: TTP: left gastroc/soleus, quadriceps, hamstrings, popliteal fossa, IT band, TFL, and hip adductors  LOWER EXTREMITY ROM: PROM was attempted to be assessed but unable to be completed due to pain severity  Active ROM Right eval Left eval  Hip flexion 91 77; familiar pain  Hip extension    Hip abduction    Hip adduction    Hip internal rotation    Hip external rotation    Knee flexion    Knee extension    Ankle dorsiflexion    Ankle plantarflexion    Ankle inversion    Ankle eversion     (Blank rows = not tested)  LOWER EXTREMITY MMT:  MMT Right eval Left eval  Hip flexion 3+/5 4-/5; slight pain  Hip extension    Hip abduction    Hip adduction    Hip internal rotation    Hip external rotation    Knee flexion 5/5 4+/5  Knee extension 5/5 4+/5; familiar knee pain   Ankle dorsiflexion 4/5 4/5  Ankle plantarflexion    Ankle inversion    Ankle eversion     (Blank rows = not tested)  FUNCTIONAL TESTS:  5 times sit to stand: 23.59 seconds with upper extremity support from armrests Timed up and go (TUG): 21.64 seconds Required UE support for sit to stand transfers  GAIT: Assistive device utilized: None Level of assistance: Complete Independence Comments: step to pattern with decreased stance time and stride length on LLE    TODAY'S TREATMENT:                                                                                                                              DATE:                                     09/15/23 EXERCISE LOG  Exercise Repetitions and Resistance Comments  Nustep L3 x 16 minutes    Rocker board  5 minutes  BUE support   Step up  6" step x 3  minutes  Alternating LE; BUE support   Seated hip ADD isometric  3.5 minutes w/ 5 second hold     Seated clams  Red t-band x 3 minutes     Sit to stand  10 reps     Blank cell = exercise not performed today                                    08/31/23 EXERCISE LOG  Exercise Repetitions and Resistance Comments  Nustep  L4 x 16 minutes    Rocker board 3 minutes  BUE support   Marching on foam  3 minutes  BUE support  Seated hip ADD isometric  2.5 minutes w/ 5 second hold        Blank cell = exercise not performed today  Manual Therapy Soft Tissue Mobilization: left TFL and IT band, for reduced pain and tone                                     08/24/23 EXERCISE LOG  Exercise Repetitions and Resistance Comments  Nustep  L4 x 15 minutes    Seated clams  Green t-band x 3 minutes   Seated hip ADD isometric 3 minutes w/ 5 second hold    Seated HS curl  Green t-band x 10 reps  LLE only        Blank cell = exercise not performed today  Manual Therapy Soft Tissue Mobilization: left TFL and IT band, for reduced pain and tone    PATIENT EDUCATION:  Education details: the importance of her HEP  Person educated: Patient Education method: Explanation Education comprehension: verbalized understanding  HOME EXERCISE PROGRAM:   ASSESSMENT:  CLINICAL IMPRESSION: Patient was progressed with resisted clams in addition to familiar interventions. She required minimal cueing with step ups to alternate lower extremities to simulate navigating stairs. She experienced no significant increase in pain or discomfort with any of today's interventions. She reported feeling better upon the conclusion of treatment. She continues to require skilled physical therapy to address her remaining impairments to maximize her safety and functional mobility.    OBJECTIVE IMPAIRMENTS: Abnormal gait, decreased activity tolerance, decreased balance, decreased mobility, difficulty walking, decreased ROM, decreased strength, hypomobility, impaired flexibility, impaired tone, and pain.   ACTIVITY LIMITATIONS: carrying,  lifting, standing, squatting, sleeping, stairs, transfers, and locomotion level  PARTICIPATION LIMITATIONS: meal prep, cleaning, laundry, shopping, and community activity  PERSONAL FACTORS: Age, Past/current experiences, Time since onset of injury/illness/exacerbation, and 3+ comorbidities: Hypertension, diabetes, and arthritis  are also affecting patient's functional outcome.   REHAB POTENTIAL: Good  CLINICAL DECISION MAKING: Evolving/moderate complexity  EVALUATION COMPLEXITY: Moderate   GOALS: Goals reviewed with patient? Yes  SHORT TERM GOALS: Target date: 08/29/23 Patient will be independent with her initial HEP. Baseline: Goal status: MET  2.  Patient will improve her left hip flexion to at least 85 degrees for improved hip mobility. Baseline:  Goal status: INITIAL  3.  Patient will improve her 5 times sit to stand time to 18 seconds or less for improved lower extremity power. Baseline:  Goal status: INITIAL  LONG TERM GOALS: Target date: 09/19/23  Patient will be independent with her advanced HEP. Baseline:  Goal status: ON GOING  2.  Patient will improve her 5 times sit to stand time to 15 seconds or less  to reduce her fall risk. Baseline:  Goal status: INITIAL  3.  Patient will improve her timed up and go time to 15 seconds or less for improved functional mobility. Baseline:  Goal status: INITIAL  4.  Patient will be able to ambulate with no significant gait deviations. Baseline:  Goal status: ON GOING  5.  Patient will be able to complete her daily activities without her familiar left lower extremity pain exceeding 3/10. Baseline:  Goal status: ON GOING  PLAN:  PT FREQUENCY: 2x/week  PT DURATION: 6 weeks  PLANNED INTERVENTIONS: Therapeutic exercises, Therapeutic activity, Neuromuscular re-education, Balance training, Gait training, Patient/Family education, Self Care, Joint mobilization, Stair training, Electrical stimulation, Cryotherapy, Moist heat,  Vasopneumatic device, Manual therapy, and Re-evaluation  PLAN FOR NEXT SESSION: NuStep, isometrics, manual therapy, and modalities as needed   Granville Lewis, PT 09/15/2023, 2:49 PM

## 2023-09-21 ENCOUNTER — Ambulatory Visit: Payer: 59

## 2023-09-21 DIAGNOSIS — R262 Difficulty in walking, not elsewhere classified: Secondary | ICD-10-CM

## 2023-09-21 DIAGNOSIS — M6281 Muscle weakness (generalized): Secondary | ICD-10-CM | POA: Diagnosis not present

## 2023-09-21 DIAGNOSIS — M79605 Pain in left leg: Secondary | ICD-10-CM | POA: Diagnosis not present

## 2023-09-21 NOTE — Therapy (Signed)
OUTPATIENT PHYSICAL THERAPY LOWER EXTREMITY TREATMENT   Patient Name: Candice Brewer MRN: [id number] DOB:[birth year], 89 y.o., female Today's Date: 09/21/2023  END OF SESSION:  PT End of Session - 09/21/23 1348     Visit Number 7    Number of Visits 12    Date for PT Re-Evaluation 10/28/23    PT Start Time 1345    PT Stop Time 1428    PT Time Calculation (min) 43 min    Activity Tolerance Patient tolerated treatment well    Behavior During Therapy [facility] for tasks assessed/performed                   Past Medical History:  Diagnosis Date   Arthritis    Chest pain, unspecified    Constipation    DM (diabetes mellitus) (HCC)    Dry eyes    Esophageal stricture 09/2011   schatzki's ring   Fibroids    uterus   Glaucoma    Helicobacter pylori gastritis 2012   Treated   HTN (hypertension)    Hyperlipidemia    Restless legs syndrome (RLS)    Vitamin D deficiency    Past Surgical History:  Procedure Laterality Date   APPENDECTOMY     BREAST BIOPSY Left 03/20/2018   Procedure: BREAST BIOPSY WITH NEEDLE LOCALIZATION;  Surgeon: Lucretia Roers, MD;  Location: [facility];  Service: General;  Laterality: Left;   BREAST EXCISIONAL BIOPSY Left 2019   Atypical hyperplasia removed   CARDIOVASCULAR STRESS TEST  2009   CATARACT EXTRACTION  2010   right eye   CATARACT EXTRACTION  2012   left eye   COLONOSCOPY  08/2006   Dr. Elmer Ramp   COLONOSCOPY  10/13/2011   Normal. Next TCS 09/2016.   Procedure: COLONOSCOPY;  Surgeon: Corbin Ade, MD;  Location: [facility];  Service: Endoscopy;  Laterality: N/A;  8:15   COLONOSCOPY N/A 03/10/2015   RMR: External and anal canal hemorrhoids likely source of hematochezia. Redundant colon. Single sigmoid polyp removed ad described above.    ESOPHAGOGASTRODUODENOSCOPY  06/2008   Dr. Sinda Du ring s/p disruption, erosive reflux esophagitis, hh.    ESOPHAGOGASTRODUODENOSCOPY  09/2011   Schatki's ring s/p dilation,  multiple 1-65mm antral and bulbar erosions, bx positive for H.Pylori s/ treatment   TUBAL LIGATION     Patient Active Problem List   Diagnosis Date Noted   Pain in left hip 09/06/2023   Pain in left knee 09/06/2023   Vitamin D deficiency 04/20/2023   Schatzki's ring 01/03/2012    Class: History of   FH: colon cancer 09/16/2011   Esophageal dysphagia 09/16/2011   CATARACT, LEFT EYE 06/20/2009   CALLUSES, FEET, BILATERAL 06/20/2009   Hyperlipidemia associated with type 2 diabetes mellitus (HCC) 02/08/2008   Type 2 diabetes mellitus (HCC) 01/20/2007   Hypertension associated with diabetes (HCC) 10/28/2006   REFERRING PROVIDER: Sonny Masters, FNP   REFERRING DIAG: Left leg pain   THERAPY DIAG:  Pain in left leg  Difficulty in walking, not elsewhere classified  Muscle weakness (generalized)  Rationale for Evaluation and Treatment: Rehabilitation  ONSET DATE: December 2023   SUBJECTIVE:   SUBJECTIVE STATEMENT: Patient reports that her leg feels better today.   PERTINENT HISTORY: Hypertension, diabetes, and arthritis PAIN:  Are you having pain? Yes: NPRS scale: "a little" /10 Pain location: left hip and knee Pain description: intermittent sharp pain  Aggravating factors: standing, walking, and laying on her left side Relieving factors: sitting down  PRECAUTIONS: Fall  RED FLAGS: None   WEIGHT BEARING RESTRICTIONS: No  FALLS:  Has patient fallen in last 6 months? Yes. Number of falls 2  LIVING ENVIRONMENT: Lives with: lives with their daughter Lives in: House/apartment Stairs: Yes: External: 1 steps; none; she has other steps at home, but she does not go up or down these steps anymore Has following equipment at home: None  OCCUPATION: retired  PLOF: Independent  PATIENT GOALS: reduced pain and be able to go back to her gym program  NEXT MD VISIT: 10/21/23  OBJECTIVE: all objective measures were assessed at her initial evaluation on 08/08/23 unless  otherwise noted  COGNITION: Overall cognitive status: Within functional limits for tasks assessed     SENSATION: Patient reports no numbness or tingling  EDEMA:  No edema observed  PALPATION: TTP: left gastroc/soleus, quadriceps, hamstrings, popliteal fossa, IT band, TFL, and hip adductors  LOWER EXTREMITY ROM: PROM was attempted to be assessed but unable to be completed due to pain severity  Active ROM Right eval Left eval  Hip flexion 91 77; familiar pain  Hip extension    Hip abduction    Hip adduction    Hip internal rotation    Hip external rotation    Knee flexion    Knee extension    Ankle dorsiflexion    Ankle plantarflexion    Ankle inversion    Ankle eversion     (Blank rows = not tested)  LOWER EXTREMITY MMT:  MMT Right eval Left eval  Hip flexion 3+/5 4-/5; slight pain  Hip extension    Hip abduction    Hip adduction    Hip internal rotation    Hip external rotation    Knee flexion 5/5 4+/5  Knee extension 5/5 4+/5; familiar knee pain   Ankle dorsiflexion 4/5 4/5  Ankle plantarflexion    Ankle inversion    Ankle eversion     (Blank rows = not tested)  FUNCTIONAL TESTS:  5 times sit to stand: 23.59 seconds with upper extremity support from armrests Timed up and go (TUG): 21.64 seconds Required UE support for sit to stand transfers  GAIT: Assistive device utilized: None Level of assistance: Complete Independence Comments: step to pattern with decreased stance time and stride length on LLE    TODAY'S TREATMENT:                                                                                                                              DATE:                                     09/21/23 EXERCISE LOG  Exercise Repetitions and Resistance Comments  Nustep  L4 x 19 minutes   Standing hip extension  2 minutes  Alternating LE; with knee flexion  Seated clams  Green t-band x 3 minutes  LAQ 4# x 2.5 minutes Alternating LE   Rocker board  5  minutes    Step up  6" step x 20 reps  Leading with LLE    Blank cell = exercise not performed today                                    09/15/23 EXERCISE LOG  Exercise Repetitions and Resistance Comments  Nustep L3 x 16 minutes    Rocker board  5 minutes  BUE support   Step up  6" step x 3 minutes  Alternating LE; BUE support   Seated hip ADD isometric  3.5 minutes w/ 5 second hold    Seated clams  Red t-band x 3 minutes     Sit to stand  10 reps     Blank cell = exercise not performed today                                    08/31/23 EXERCISE LOG  Exercise Repetitions and Resistance Comments  Nustep  L4 x 16 minutes    Rocker board 3 minutes  BUE support   Marching on foam  3 minutes  BUE support  Seated hip ADD isometric  2.5 minutes w/ 5 second hold        Blank cell = exercise not performed today  Manual Therapy Soft Tissue Mobilization: left TFL and IT band, for reduced pain and tone    PATIENT EDUCATION:  Education details: the importance of her HEP  Person educated: Patient Education method: Explanation Education comprehension: verbalized understanding  HOME EXERCISE PROGRAM:   ASSESSMENT:  CLINICAL IMPRESSION: Patient was progressed with multiple new standing interventions for improved function with standing interventions. She required minimal cueing with today's standing interventions for proper exercise performance. She experienced no pain or discomfort with any of today's interventions. She reported feeling better upon the conclusion of treatment. She continues to require skilled physical therapy to address her remaining impairments to return to her prior level of function.    OBJECTIVE IMPAIRMENTS: Abnormal gait, decreased activity tolerance, decreased balance, decreased mobility, difficulty walking, decreased ROM, decreased strength, hypomobility, impaired flexibility, impaired tone, and pain.   ACTIVITY LIMITATIONS: carrying, lifting, standing, squatting,  sleeping, stairs, transfers, and locomotion level  PARTICIPATION LIMITATIONS: meal prep, cleaning, laundry, shopping, and community activity  PERSONAL FACTORS: Age, Past/current experiences, Time since onset of injury/illness/exacerbation, and 3+ comorbidities: Hypertension, diabetes, and arthritis  are also affecting patient's functional outcome.   REHAB POTENTIAL: Good  CLINICAL DECISION MAKING: Evolving/moderate complexity  EVALUATION COMPLEXITY: Moderate   GOALS: Goals reviewed with patient? Yes  SHORT TERM GOALS: Target date: 08/29/23 Patient will be independent with her initial HEP. Baseline: Goal status: MET  2.  Patient will improve her left hip flexion to at least 85 degrees for improved hip mobility. Baseline:  Goal status: INITIAL  3.  Patient will improve her 5 times sit to stand time to 18 seconds or less for improved lower extremity power. Baseline:  Goal status: INITIAL  LONG TERM GOALS: Target date: 09/19/23  Patient will be independent with her advanced HEP. Baseline:  Goal status: ON GOING  2.  Patient will improve her 5 times sit to stand time to 15 seconds or less to reduce her fall risk. Baseline:  Goal status: INITIAL  3.  Patient will improve her timed up and go time to 15 seconds or less for improved functional mobility. Baseline:  Goal status: INITIAL  4.  Patient will be able to ambulate with no significant gait deviations. Baseline:  Goal status: ON GOING  5.  Patient will be able to complete her daily activities without her familiar left lower extremity pain exceeding 3/10. Baseline:  Goal status: ON GOING  PLAN:  PT FREQUENCY: 2x/week  PT DURATION: 6 weeks  PLANNED INTERVENTIONS: Therapeutic exercises, Therapeutic activity, Neuromuscular re-education, Balance training, Gait training, Patient/Family education, Self Care, Joint mobilization, Stair training, Electrical stimulation, Cryotherapy, Moist heat, Vasopneumatic device, Manual  therapy, and Re-evaluation  PLAN FOR NEXT SESSION: NuStep, isometrics, manual therapy, and modalities as needed   Granville Lewis, PT 09/21/2023, 2:46 PM

## 2023-09-27 DIAGNOSIS — E119 Type 2 diabetes mellitus without complications: Secondary | ICD-10-CM | POA: Diagnosis not present

## 2023-09-27 DIAGNOSIS — I10 Essential (primary) hypertension: Secondary | ICD-10-CM | POA: Diagnosis not present

## 2023-09-28 ENCOUNTER — Ambulatory Visit: Payer: 59

## 2023-09-28 DIAGNOSIS — R262 Difficulty in walking, not elsewhere classified: Secondary | ICD-10-CM

## 2023-09-28 DIAGNOSIS — M79605 Pain in left leg: Secondary | ICD-10-CM | POA: Diagnosis not present

## 2023-09-28 DIAGNOSIS — M6281 Muscle weakness (generalized): Secondary | ICD-10-CM | POA: Diagnosis not present

## 2023-09-28 NOTE — Therapy (Signed)
OUTPATIENT PHYSICAL THERAPY LOWER EXTREMITY TREATMENT   Patient Name: Candice Brewer MRN: [id number] DOB:[birth year], 89 y.o., female Today's Date: 09/28/2023  END OF SESSION:  PT End of Session - 09/28/23 1356     Visit Number 8    Number of Visits 12    Date for PT Re-Evaluation 10/28/23    PT Start Time 1344    PT Stop Time 1430    PT Time Calculation (min) 46 min    Activity Tolerance Patient tolerated treatment well    Behavior During Therapy [facility] for tasks assessed/performed                    Past Medical History:  Diagnosis Date   Arthritis    Chest pain, unspecified    Constipation    DM (diabetes mellitus) (HCC)    Dry eyes    Esophageal stricture 09/2011   schatzki's ring   Fibroids    uterus   Glaucoma    Helicobacter pylori gastritis 2012   Treated   HTN (hypertension)    Hyperlipidemia    Restless legs syndrome (RLS)    Vitamin D deficiency    Past Surgical History:  Procedure Laterality Date   APPENDECTOMY     BREAST BIOPSY Left 03/20/2018   Procedure: BREAST BIOPSY WITH NEEDLE LOCALIZATION;  Surgeon: Lucretia Roers, MD;  Location: [facility];  Service: General;  Laterality: Left;   BREAST EXCISIONAL BIOPSY Left 2019   Atypical hyperplasia removed   CARDIOVASCULAR STRESS TEST  2009   CATARACT EXTRACTION  2010   right eye   CATARACT EXTRACTION  2012   left eye   COLONOSCOPY  08/2006   Dr. Elmer Ramp   COLONOSCOPY  10/13/2011   Normal. Next TCS 09/2016.   Procedure: COLONOSCOPY;  Surgeon: Corbin Ade, MD;  Location: [facility];  Service: Endoscopy;  Laterality: N/A;  8:15   COLONOSCOPY N/A 03/10/2015   RMR: External and anal canal hemorrhoids likely source of hematochezia. Redundant colon. Single sigmoid polyp removed ad described above.    ESOPHAGOGASTRODUODENOSCOPY  06/2008   Dr. Sinda Du ring s/p disruption, erosive reflux esophagitis, hh.    ESOPHAGOGASTRODUODENOSCOPY  09/2011   Schatki's ring s/p dilation,  multiple 1-55mm antral and bulbar erosions, bx positive for H.Pylori s/ treatment   TUBAL LIGATION     Patient Active Problem List   Diagnosis Date Noted   Pain in left hip 09/06/2023   Pain in left knee 09/06/2023   Vitamin D deficiency 04/20/2023   Schatzki's ring 01/03/2012    Class: History of   FH: colon cancer 09/16/2011   Esophageal dysphagia 09/16/2011   CATARACT, LEFT EYE 06/20/2009   CALLUSES, FEET, BILATERAL 06/20/2009   Hyperlipidemia associated with type 2 diabetes mellitus (HCC) 02/08/2008   Type 2 diabetes mellitus (HCC) 01/20/2007   Hypertension associated with diabetes (HCC) 10/28/2006   REFERRING PROVIDER: Sonny Masters, FNP   REFERRING DIAG: Left leg pain   THERAPY DIAG:  Pain in left leg  Difficulty in walking, not elsewhere classified  Muscle weakness (generalized)  Rationale for Evaluation and Treatment: Rehabilitation  ONSET DATE: December 2023   SUBJECTIVE:   SUBJECTIVE STATEMENT: Patient reports that she is still hurting about the same today in her hip. She notes that she is able to use her legs more, but she is still having some pain when kicking her leg out.   PERTINENT HISTORY: Hypertension, diabetes, and arthritis PAIN:  Are you having pain? Yes: NPRS scale: "  a little" /10 Pain location: left hip and knee Pain description: intermittent sharp pain  Aggravating factors: standing, walking, and laying on her left side Relieving factors: sitting down   PRECAUTIONS: Fall  RED FLAGS: None   WEIGHT BEARING RESTRICTIONS: No  FALLS:  Has patient fallen in last 6 months? Yes. Number of falls 2  LIVING ENVIRONMENT: Lives with: lives with their daughter Lives in: House/apartment Stairs: Yes: External: 1 steps; none; she has other steps at home, but she does not go up or down these steps anymore Has following equipment at home: None  OCCUPATION: retired  PLOF: Independent  PATIENT GOALS: reduced pain and be able to go back to her gym  program  NEXT MD VISIT: 10/21/23  OBJECTIVE: all objective measures were assessed at her initial evaluation on 08/08/23 unless otherwise noted  COGNITION: Overall cognitive status: Within functional limits for tasks assessed     SENSATION: Patient reports no numbness or tingling  EDEMA:  No edema observed  PALPATION: TTP: left gastroc/soleus, quadriceps, hamstrings, popliteal fossa, IT band, TFL, and hip adductors  LOWER EXTREMITY ROM: PROM was attempted to be assessed but unable to be completed due to pain severity  Active ROM Right eval Left eval  Hip flexion 91 77; familiar pain  Hip extension    Hip abduction    Hip adduction    Hip internal rotation    Hip external rotation    Knee flexion    Knee extension    Ankle dorsiflexion    Ankle plantarflexion    Ankle inversion    Ankle eversion     (Blank rows = not tested)  LOWER EXTREMITY MMT:  MMT Right eval Left eval  Hip flexion 3+/5 4-/5; slight pain  Hip extension    Hip abduction    Hip adduction    Hip internal rotation    Hip external rotation    Knee flexion 5/5 4+/5  Knee extension 5/5 4+/5; familiar knee pain   Ankle dorsiflexion 4/5 4/5  Ankle plantarflexion    Ankle inversion    Ankle eversion     (Blank rows = not tested)  FUNCTIONAL TESTS:  5 times sit to stand: 23.59 seconds with upper extremity support from armrests Timed up and go (TUG): 21.64 seconds Required UE support for sit to stand transfers  GAIT: Assistive device utilized: None Level of assistance: Complete Independence Comments: step to pattern with decreased stance time and stride length on LLE    TODAY'S TREATMENT:                                                                                                                              DATE:                                     09/28/23 EXERCISE LOG  Exercise Repetitions and Resistance Comments  Nustep L4 x 20 minutes   Marching on foam 3 minutes  BUE support    Rocker board  4 minutes  BUE support   Supine march 3 minutes  Alternating LE        Blank cell = exercise not performed today                                    09/21/23 EXERCISE LOG  Exercise Repetitions and Resistance Comments  Nustep  L4 x 19 minutes   Standing hip extension  2 minutes  Alternating LE; with knee flexion  Seated clams  Green t-band x 3 minutes   LAQ 4# x 2.5 minutes Alternating LE   Rocker board  5 minutes    Step up  6" step x 20 reps  Leading with LLE    Blank cell = exercise not performed today                                    09/15/23 EXERCISE LOG  Exercise Repetitions and Resistance Comments  Nustep L3 x 16 minutes    Rocker board  5 minutes  BUE support   Step up  6" step x 3 minutes  Alternating LE; BUE support   Seated hip ADD isometric  3.5 minutes w/ 5 second hold    Seated clams  Red t-band x 3 minutes     Sit to stand  10 reps     Blank cell = exercise not performed today   PATIENT EDUCATION:  Education details: progress with therapy Person educated: Patient Education method: Explanation Education comprehension: verbalized understanding  HOME EXERCISE PROGRAM:   ASSESSMENT:  CLINICAL IMPRESSION: Patient is making minimal progress with skilled physical therapy as evidenced by her subjective reports, objective measures, functional mobility, and progress toward her goals. She was able to demonstrate improved left hip mobility since her initial evaluation. However, she continues to experience continued left hip pain which limits her functional mobility. Today's interventions focused on new and familiar interventions for improved hip flexor engagement. She required minimal cueing with supine marching for proper positioning to avoid compensatory movement patterns. She experienced a mild increase in her familiar pain marching on foam, but this did not prevent her from completing any of today's interventions. Recommend that she continue with her  current plan of care to address her impairments and evaluate for a plateau in progress.   OBJECTIVE IMPAIRMENTS: Abnormal gait, decreased activity tolerance, decreased balance, decreased mobility, difficulty walking, decreased ROM, decreased strength, hypomobility, impaired flexibility, impaired tone, and pain.   ACTIVITY LIMITATIONS: carrying, lifting, standing, squatting, sleeping, stairs, transfers, and locomotion level  PARTICIPATION LIMITATIONS: meal prep, cleaning, laundry, shopping, and community activity  PERSONAL FACTORS: Age, Past/current experiences, Time since onset of injury/illness/exacerbation, and 3+ comorbidities: Hypertension, diabetes, and arthritis  are also affecting patient's functional outcome.   REHAB POTENTIAL: Good  CLINICAL DECISION MAKING: Evolving/moderate complexity  EVALUATION COMPLEXITY: Moderate   GOALS: Goals reviewed with patient? Yes  SHORT TERM GOALS: Target date: 08/29/23 Patient will be independent with her initial HEP. Baseline: Goal status: MET  2.  Patient will improve her left hip flexion to at least 85 degrees for improved hip mobility. Baseline: 88 degrees with minimal pain at end range Goal status: MET  3.  Patient will improve her 5 times sit to  stand time to 18 seconds or less for improved lower extremity power. Baseline: 26.66 seconds with upper extremity support from arm rests  Goal status: ON GOING  LONG TERM GOALS: Target date: 09/19/23  Patient will be independent with her advanced HEP. Baseline:  Goal status: ON GOING  2.  Patient will improve her 5 times sit to stand time to 15 seconds or less to reduce her fall risk. Baseline: 26.66 seconds with upper extremity support from arm rests Goal status: ON GOING  3.  Patient will improve her timed up and go time to 15 seconds or less for improved functional mobility. Baseline: 24.98 seconds Goal status: ON GOING  4.  Patient will be able to ambulate with no significant  gait deviations. Baseline: continues to exhibit reduced step length and absent heel strike and toe off  Goal status: ON GOING  5.  Patient will be able to complete her daily activities without her familiar left lower extremity pain exceeding 3/10. Baseline: continues to experience left hip pain Goal status: ON GOING  PLAN:  PT FREQUENCY: 2x/week  PT DURATION: 6 weeks  PLANNED INTERVENTIONS: Therapeutic exercises, Therapeutic activity, Neuromuscular re-education, Balance training, Gait training, Patient/Family education, Self Care, Joint mobilization, Stair training, Electrical stimulation, Cryotherapy, Moist heat, Vasopneumatic device, Manual therapy, and Re-evaluation  PLAN FOR NEXT SESSION: NuStep, isometrics, manual therapy, and modalities as needed   Granville Lewis, PT 09/28/2023, 2:42 PM

## 2023-09-29 DIAGNOSIS — E119 Type 2 diabetes mellitus without complications: Secondary | ICD-10-CM | POA: Diagnosis not present

## 2023-09-29 DIAGNOSIS — I10 Essential (primary) hypertension: Secondary | ICD-10-CM | POA: Diagnosis not present

## 2023-10-05 ENCOUNTER — Ambulatory Visit: Payer: 59 | Attending: Family Medicine

## 2023-10-05 DIAGNOSIS — M6281 Muscle weakness (generalized): Secondary | ICD-10-CM

## 2023-10-05 DIAGNOSIS — R262 Difficulty in walking, not elsewhere classified: Secondary | ICD-10-CM

## 2023-10-05 DIAGNOSIS — M79605 Pain in left leg: Secondary | ICD-10-CM | POA: Diagnosis not present

## 2023-10-05 NOTE — Therapy (Signed)
OUTPATIENT PHYSICAL THERAPY LOWER EXTREMITY TREATMENT   Patient Name: Candice Brewer MRN: [id number] DOB:29-Apr-1932, 89 y.o., female Today's Date: 10/05/2023  END OF SESSION:  PT End of Session - 10/05/23 1406     Visit Number 9    Number of Visits 12    Date for PT Re-Evaluation 10/28/23    PT Start Time 1347    PT Stop Time 1430    PT Time Calculation (min) 43 min    Activity Tolerance Patient tolerated treatment well    Behavior During Therapy [facility] for tasks assessed/performed                     Past Medical History:  Diagnosis Date   Arthritis    Chest pain, unspecified    Constipation    DM (diabetes mellitus) (HCC)    Dry eyes    Esophageal stricture 09/2011   schatzki's ring   Fibroids    uterus   Glaucoma    Helicobacter pylori gastritis 2012   Treated   HTN (hypertension)    Hyperlipidemia    Restless legs syndrome (RLS)    Vitamin D deficiency    Past Surgical History:  Procedure Laterality Date   APPENDECTOMY     BREAST BIOPSY Left 03/20/2018   Procedure: BREAST BIOPSY WITH NEEDLE LOCALIZATION;  Surgeon: Lucretia Roers, MD;  Location: [facility];  Service: General;  Laterality: Left;   BREAST EXCISIONAL BIOPSY Left 2019   Atypical hyperplasia removed   CARDIOVASCULAR STRESS TEST  2009   CATARACT EXTRACTION  2010   right eye   CATARACT EXTRACTION  2012   left eye   COLONOSCOPY  08/2006   Dr. Elmer Ramp   COLONOSCOPY  10/13/2011   Normal. Next TCS 09/2016.   Procedure: COLONOSCOPY;  Surgeon: Corbin Ade, MD;  Location: [facility];  Service: Endoscopy;  Laterality: N/A;  8:15   COLONOSCOPY N/A 03/10/2015   RMR: External and anal canal hemorrhoids likely source of hematochezia. Redundant colon. Single sigmoid polyp removed ad described above.    ESOPHAGOGASTRODUODENOSCOPY  06/2008   Dr. Sinda Du ring s/p disruption, erosive reflux esophagitis, hh.    ESOPHAGOGASTRODUODENOSCOPY  09/2011   Schatki's ring s/p dilation,  multiple 1-58mm antral and bulbar erosions, bx positive for H.Pylori s/ treatment   TUBAL LIGATION     Patient Active Problem List   Diagnosis Date Noted   Pain in left hip 09/06/2023   Pain in left knee 09/06/2023   Vitamin D deficiency 04/20/2023   Schatzki's ring 01/03/2012    Class: History of   FH: colon cancer 09/16/2011   Esophageal dysphagia 09/16/2011   CATARACT, LEFT EYE 06/20/2009   CALLUSES, FEET, BILATERAL 06/20/2009   Hyperlipidemia associated with type 2 diabetes mellitus (HCC) 02/08/2008   Type 2 diabetes mellitus (HCC) 01/20/2007   Hypertension associated with diabetes (HCC) 10/28/2006   REFERRING PROVIDER: Sonny Masters, FNP   REFERRING DIAG: Left leg pain   THERAPY DIAG:  Pain in left leg  Difficulty in walking, not elsewhere classified  Muscle weakness (generalized)  Rationale for Evaluation and Treatment: Rehabilitation  ONSET DATE: December 2023   SUBJECTIVE:   SUBJECTIVE STATEMENT: Patient reports that her hip is hurting about the same today. She is scheduled to see Dr. Shon Baton when she returns from her trip to see her son.   PERTINENT HISTORY: Hypertension, diabetes, and arthritis PAIN:  Are you having pain? Yes: NPRS scale: "a little" /10 Pain location: left hip and  knee Pain description: intermittent sharp pain  Aggravating factors: standing, walking, and laying on her left side Relieving factors: sitting down   PRECAUTIONS: Fall  RED FLAGS: None   WEIGHT BEARING RESTRICTIONS: No  FALLS:  Has patient fallen in last 6 months? Yes. Number of falls 2  LIVING ENVIRONMENT: Lives with: lives with their daughter Lives in: House/apartment Stairs: Yes: External: 1 steps; none; she has other steps at home, but she does not go up or down these steps anymore Has following equipment at home: None  OCCUPATION: retired  PLOF: Independent  PATIENT GOALS: reduced pain and be able to go back to her gym program  NEXT MD VISIT:  10/21/23  OBJECTIVE: all objective measures were assessed at her initial evaluation on 08/08/23 unless otherwise noted  COGNITION: Overall cognitive status: Within functional limits for tasks assessed     SENSATION: Patient reports no numbness or tingling  EDEMA:  No edema observed  PALPATION: TTP: left gastroc/soleus, quadriceps, hamstrings, popliteal fossa, IT band, TFL, and hip adductors  LOWER EXTREMITY ROM: PROM was attempted to be assessed but unable to be completed due to pain severity  Active ROM Right eval Left eval  Hip flexion 91 77; familiar pain  Hip extension    Hip abduction    Hip adduction    Hip internal rotation    Hip external rotation    Knee flexion    Knee extension    Ankle dorsiflexion    Ankle plantarflexion    Ankle inversion    Ankle eversion     (Blank rows = not tested)  LOWER EXTREMITY MMT:  MMT Right eval Left eval  Hip flexion 3+/5 4-/5; slight pain  Hip extension    Hip abduction    Hip adduction    Hip internal rotation    Hip external rotation    Knee flexion 5/5 4+/5  Knee extension 5/5 4+/5; familiar knee pain   Ankle dorsiflexion 4/5 4/5  Ankle plantarflexion    Ankle inversion    Ankle eversion     (Blank rows = not tested)  FUNCTIONAL TESTS:  5 times sit to stand: 23.59 seconds with upper extremity support from armrests Timed up and go (TUG): 21.64 seconds Required UE support for sit to stand transfers  GAIT: Assistive device utilized: None Level of assistance: Complete Independence Comments: step to pattern with decreased stance time and stride length on LLE    TODAY'S TREATMENT:                                                                                                                              DATE:                                     10/05/23 EXERCISE LOG  Exercise Repetitions and Resistance Comments  Nustep  L4 x 20 minutes  Side stepping on foam  10 laps  BUE support   Rocker board  3 minutes   BUE support   Lunges onto step  14" step x 3 minutes  LLE on step   Seated hip ADD isometric  3 minutes w/ 5 second hold     Blank cell = exercise not performed today                                    09/28/23 EXERCISE LOG  Exercise Repetitions and Resistance Comments  Nustep L4 x 20 minutes   Marching on foam 3 minutes  BUE support   Rocker board  4 minutes  BUE support   Supine march 3 minutes  Alternating LE        Blank cell = exercise not performed today                                    09/21/23 EXERCISE LOG  Exercise Repetitions and Resistance Comments  Nustep  L4 x 19 minutes   Standing hip extension  2 minutes  Alternating LE; with knee flexion  Seated clams  Green t-band x 3 minutes   LAQ 4# x 2.5 minutes Alternating LE   Rocker board  5 minutes    Step up  6" step x 20 reps  Leading with LLE    Blank cell = exercise not performed today   PATIENT EDUCATION:  Education details: progress with therapy Person educated: Patient Education method: Explanation Education comprehension: verbalized understanding  HOME EXERCISE PROGRAM:   ASSESSMENT:  CLINICAL IMPRESSION: Patient was progressed with side stepping on foam and lunges onto a step for for improved lower extremity stability and mobility. She required minimal cueing with lunges onto the step for improved hip mobility. She experienced no increase in her familiar symptoms with any of today's interventions. She reported feeling "a little better" upon the conclusion of treatment. She continues to require skilled physical therapy to address her remaining impairments to maximize her functional mobility.   OBJECTIVE IMPAIRMENTS: Abnormal gait, decreased activity tolerance, decreased balance, decreased mobility, difficulty walking, decreased ROM, decreased strength, hypomobility, impaired flexibility, impaired tone, and pain.   ACTIVITY LIMITATIONS: carrying, lifting, standing, squatting, sleeping, stairs, transfers, and  locomotion level  PARTICIPATION LIMITATIONS: meal prep, cleaning, laundry, shopping, and community activity  PERSONAL FACTORS: Age, Past/current experiences, Time since onset of injury/illness/exacerbation, and 3+ comorbidities: Hypertension, diabetes, and arthritis  are also affecting patient's functional outcome.   REHAB POTENTIAL: Good  CLINICAL DECISION MAKING: Evolving/moderate complexity  EVALUATION COMPLEXITY: Moderate   GOALS: Goals reviewed with patient? Yes  SHORT TERM GOALS: Target date: 08/29/23 Patient will be independent with her initial HEP. Baseline: Goal status: MET  2.  Patient will improve her left hip flexion to at least 85 degrees for improved hip mobility. Baseline: 88 degrees with minimal pain at end range Goal status: MET  3.  Patient will improve her 5 times sit to stand time to 18 seconds or less for improved lower extremity power. Baseline: 26.66 seconds with upper extremity support from arm rests  Goal status: ON GOING  LONG TERM GOALS: Target date: 09/19/23  Patient will be independent with her advanced HEP. Baseline:  Goal status: ON GOING  2.  Patient will improve her 5 times sit to stand time to 15 seconds or  less to reduce her fall risk. Baseline: 26.66 seconds with upper extremity support from arm rests Goal status: ON GOING  3.  Patient will improve her timed up and go time to 15 seconds or less for improved functional mobility. Baseline: 24.98 seconds Goal status: ON GOING  4.  Patient will be able to ambulate with no significant gait deviations. Baseline: continues to exhibit reduced step length and absent heel strike and toe off  Goal status: ON GOING  5.  Patient will be able to complete her daily activities without her familiar left lower extremity pain exceeding 3/10. Baseline: continues to experience left hip pain Goal status: ON GOING  PLAN:  PT FREQUENCY: 2x/week  PT DURATION: 6 weeks  PLANNED INTERVENTIONS:  Therapeutic exercises, Therapeutic activity, Neuromuscular re-education, Balance training, Gait training, Patient/Family education, Self Care, Joint mobilization, Stair training, Electrical stimulation, Cryotherapy, Moist heat, Vasopneumatic device, Manual therapy, and Re-evaluation  PLAN FOR NEXT SESSION: NuStep, isometrics, manual therapy, and modalities as needed   Granville Lewis, PT 10/05/2023, 3:44 PM

## 2023-10-06 DIAGNOSIS — E1142 Type 2 diabetes mellitus with diabetic polyneuropathy: Secondary | ICD-10-CM | POA: Diagnosis not present

## 2023-10-06 DIAGNOSIS — M79676 Pain in unspecified toe(s): Secondary | ICD-10-CM | POA: Diagnosis not present

## 2023-10-06 DIAGNOSIS — L84 Corns and callosities: Secondary | ICD-10-CM | POA: Diagnosis not present

## 2023-10-06 DIAGNOSIS — B351 Tinea unguium: Secondary | ICD-10-CM | POA: Diagnosis not present

## 2023-10-14 DIAGNOSIS — E119 Type 2 diabetes mellitus without complications: Secondary | ICD-10-CM | POA: Diagnosis not present

## 2023-10-14 DIAGNOSIS — I10 Essential (primary) hypertension: Secondary | ICD-10-CM | POA: Diagnosis not present

## 2023-10-18 ENCOUNTER — Other Ambulatory Visit: Payer: Self-pay

## 2023-10-18 ENCOUNTER — Ambulatory Visit (INDEPENDENT_AMBULATORY_CARE_PROVIDER_SITE_OTHER): Payer: 59 | Admitting: Sports Medicine

## 2023-10-18 ENCOUNTER — Encounter: Payer: Self-pay | Admitting: Sports Medicine

## 2023-10-18 DIAGNOSIS — M25552 Pain in left hip: Secondary | ICD-10-CM | POA: Diagnosis not present

## 2023-10-18 DIAGNOSIS — M1612 Unilateral primary osteoarthritis, left hip: Secondary | ICD-10-CM | POA: Diagnosis not present

## 2023-10-18 MED ORDER — METHYLPREDNISOLONE ACETATE 40 MG/ML IJ SUSP
40.0000 mg | INTRAMUSCULAR | Status: AC | PRN
Start: 2023-10-18 — End: 2023-10-18
  Administered 2023-10-18: 40 mg via INTRA_ARTICULAR

## 2023-10-18 MED ORDER — LIDOCAINE HCL 1 % IJ SOLN
4.0000 mL | INTRAMUSCULAR | Status: AC | PRN
Start: 2023-10-18 — End: 2023-10-18
  Administered 2023-10-18: 4 mL

## 2023-10-18 NOTE — Progress Notes (Signed)
   Procedure Note  Patient: Candice Brewer             Date of Birth: 1932-01-14           MRN: [id number]             Visit Date: 10/18/2023  Procedures: Visit Diagnoses:  1. Pain in left hip   2. Unilateral primary osteoarthritis, left hip    Large Joint Inj: L hip joint on 10/18/2023 1:05 PM Indications: pain Details: 22 G 3.5 in needle, ultrasound-guided anterior approach Medications: 4 mL lidocaine 1 %; 40 mg methylPREDNISolone acetate 40 MG/ML Outcome: tolerated well, no immediate complications  Procedure: US-guided intra-articular hip injection, left After discussion on risks/benefits/indications and informed verbal consent was obtained, a timeout was performed. Patient was lying supine on exam table. The hip was cleaned with betadine and alcohol swabs. Then utilizing ultrasound guidance, the patient's femoral head and neck junction was identified and subsequently injected with 4:1 lidocaine:depomedrol via an in-plane approach with ultrasound visualization of the injectate administered into the hip joint. Patient tolerated procedure well without immediate complications.  Procedure, treatment alternatives, risks and benefits explained, specific risks discussed. Consent was given by the patient. Immediately prior to procedure a time out was called to verify the correct patient, procedure, equipment, support staff and site/side marked as required. Patient was prepped and draped in the usual sterile fashion.    - I evaluated the patient about 5 minutes post-injection and she had quite significant improvement in pain and range of motion - follow-up with Mary-Anne Persons, PAC as indicated; I am happy to see them as needed  Madelyn Brunner, DO Primary Care Sports Medicine Physician  [facility] - Orthopedics  This note was dictated using Dragon naturally speaking software and may contain errors in syntax, spelling, or content which have not been identified prior to signing this  note.

## 2023-10-19 ENCOUNTER — Other Ambulatory Visit: Payer: Self-pay | Admitting: Family Medicine

## 2023-10-19 DIAGNOSIS — E1169 Type 2 diabetes mellitus with other specified complication: Secondary | ICD-10-CM

## 2023-10-19 DIAGNOSIS — E1159 Type 2 diabetes mellitus with other circulatory complications: Secondary | ICD-10-CM

## 2023-10-19 NOTE — Progress Notes (Signed)
I have placed the referral and will have staff fax back to [employer]

## 2023-10-21 ENCOUNTER — Telehealth: Payer: Self-pay | Admitting: Family Medicine

## 2023-10-21 ENCOUNTER — Ambulatory Visit (INDEPENDENT_AMBULATORY_CARE_PROVIDER_SITE_OTHER): Payer: 59 | Admitting: Family Medicine

## 2023-10-21 ENCOUNTER — Encounter: Payer: Self-pay | Admitting: Family Medicine

## 2023-10-21 VITALS — BP 135/70 | HR 65 | Temp 97.3°F | Resp 20 | Ht 66.0 in | Wt 141.4 lb

## 2023-10-21 DIAGNOSIS — E1159 Type 2 diabetes mellitus with other circulatory complications: Secondary | ICD-10-CM | POA: Diagnosis not present

## 2023-10-21 DIAGNOSIS — R202 Paresthesia of skin: Secondary | ICD-10-CM | POA: Insufficient documentation

## 2023-10-21 DIAGNOSIS — I152 Hypertension secondary to endocrine disorders: Secondary | ICD-10-CM | POA: Diagnosis not present

## 2023-10-21 DIAGNOSIS — N183 Chronic kidney disease, stage 3 unspecified: Secondary | ICD-10-CM | POA: Diagnosis not present

## 2023-10-21 DIAGNOSIS — E1169 Type 2 diabetes mellitus with other specified complication: Secondary | ICD-10-CM

## 2023-10-21 DIAGNOSIS — E785 Hyperlipidemia, unspecified: Secondary | ICD-10-CM | POA: Diagnosis not present

## 2023-10-21 DIAGNOSIS — E1122 Type 2 diabetes mellitus with diabetic chronic kidney disease: Secondary | ICD-10-CM | POA: Diagnosis not present

## 2023-10-21 LAB — BAYER DCA HB A1C WAIVED: HB A1C (BAYER DCA - WAIVED): 8.6 % — ABNORMAL HIGH (ref 4.8–5.6)

## 2023-10-21 NOTE — Telephone Encounter (Signed)
Copied from CRM [id number]. Topic: Referral - Status >> Oct 21, 2023 11:33 AM Tiffany H wrote: Reason for CRM: Reita Cliche with [employer] called to check on the status os a referral. Reita Cliche wanted to confirm whether the prior authorization has been cleared by insurance. Please assist.   [phone]

## 2023-10-21 NOTE — Patient Instructions (Signed)

## 2023-10-21 NOTE — Progress Notes (Signed)
Subjective:  Patient ID: Candice Brewer, female    DOB: 19-Nov-1932, 89 y.o.   MRN: [id number]  Patient Care Team: Sonny Masters, FNP as PCP - General (Family Medicine)   Chief Complaint:  Medical Management of Chronic Issues   HPI: Candice Brewer is a 89 y.o. female presenting on 10/21/2023 for Medical Management of Chronic Issues   Discussed the use of AI scribe software for clinical note transcription with the patient, who gave verbal consent to proceed.  History of Present Illness   The patient, with a history of hip pain, hypertension, and diabetes, recently underwent a hip injection. She reports a gradual improvement in hip pain and mobility since the procedure, but continues to use a walker for stability. She has been less active due to the hip pain, which has impacted her social life.  The patient also reports increased urination, particularly at night, but does not consider it excessive. She is currently on a regimen of Enalapril and Hydrochlorothiazide, which may contribute to the increased urination. She denies any dizziness, weakness, headaches, chest pain, or leg swelling.  Recently, the patient has been experiencing numbness in her hands for the past two weeks, which has affected her ability to perform certain tasks, such as opening bottles. She is currently taking a multivitamin and vitamin D supplement.  The patient has also been experiencing changes in her vision and has been prescribed new eye drops. She has an upcoming appointment with her ophthalmologist.  The patient has been working on improving her diet and has lost significant weight, necessitating a wardrobe change. She has been trying to limit her carbohydrate intake to manage her diabetes, but has found the dietary restrictions challenging. She has been substituting unhealthy snacks with vegetables and has been making efforts to maintain a balanced diet.  The patient's social life has been impacted by her hip  pain, which has limited her ability to participate in activities at her local recreation center. She is hopeful that the recent hip injection and upcoming physical therapy will improve her mobility and allow her to return to her usual activities.          Relevant past medical, surgical, family, and social history reviewed and updated as indicated.  Allergies and medications reviewed and updated. Data reviewed: Chart in Epic.   Past Medical History:  Diagnosis Date   Arthritis    Chest pain, unspecified    Constipation    DM (diabetes mellitus) (HCC)    Dry eyes    Esophageal stricture 09/2011   schatzki's ring   Fibroids    uterus   Glaucoma    Helicobacter pylori gastritis 2012   Treated   HTN (hypertension)    Hyperlipidemia    Restless legs syndrome (RLS)    Vitamin D deficiency     Past Surgical History:  Procedure Laterality Date   APPENDECTOMY     BREAST BIOPSY Left 03/20/2018   Procedure: BREAST BIOPSY WITH NEEDLE LOCALIZATION;  Surgeon: Lucretia Roers, MD;  Location: [facility];  Service: General;  Laterality: Left;   BREAST EXCISIONAL BIOPSY Left 2019   Atypical hyperplasia removed   CARDIOVASCULAR STRESS TEST  2009   CATARACT EXTRACTION  2010   right eye   CATARACT EXTRACTION  2012   left eye   COLONOSCOPY  08/2006   Dr. Elmer Ramp   COLONOSCOPY  10/13/2011   Normal. Next TCS 09/2016.   Procedure: COLONOSCOPY;  Surgeon: Corbin Ade, MD;  Location: [facility];  Service: Endoscopy;  Laterality: N/A;  8:15   COLONOSCOPY N/A 03/10/2015   RMR: External and anal canal hemorrhoids likely source of hematochezia. Redundant colon. Single sigmoid polyp removed ad described above.    ESOPHAGOGASTRODUODENOSCOPY  06/2008   Dr. Sinda Du ring s/p disruption, erosive reflux esophagitis, hh.    ESOPHAGOGASTRODUODENOSCOPY  09/2011   Schatki's ring s/p dilation, multiple 1-59mm antral and bulbar erosions, bx positive for H.Pylori s/ treatment   TUBAL  LIGATION      Social History   Socioeconomic History   Marital status: Divorced    Spouse name: Not on file   Number of children: 5   Years of education: Not on file   Highest education level: Associate degree: occupational, Scientist, product/process development, or vocational program  Occupational History   Occupation: retired Psychologist, occupational  Tobacco Use   Smoking status: Never   Smokeless tobacco: Never   Tobacco comments:    quit about 35+ yrs  Vaping Use   Vaping status: Never Used  Substance and Sexual Activity   Alcohol use: No    Alcohol/week: 0.0 standard drinks of alcohol   Drug use: No   Sexual activity: Not Currently  Other Topics Concern   Not on file  Social History Narrative   Not on file   Social Determinants of Health   Financial Resource Strain: Patient Declined (10/18/2023)   Overall Financial Resource Strain (CARDIA)    Difficulty of Paying Living Expenses: Patient declined  Food Insecurity: Patient Declined (10/18/2023)   Hunger Vital Sign    Worried About Running Out of Food in the Last Year: Patient declined    Ran Out of Food in the Last Year: Patient declined  Transportation Needs: Patient Declined (10/18/2023)   PRAPARE - Administrator, Civil Service (Medical): Patient declined    Lack of Transportation (Non-Medical): Patient declined  Physical Activity: Sufficiently Active (04/17/2023)   Exercise Vital Sign    Days of Exercise per Week: 4 days    Minutes of Exercise per Session: 60 min  Stress: No Stress Concern Present (04/17/2023)   [employer] of Occupational Health - Occupational Stress Questionnaire    Feeling of Stress : Not at all  Social Connections: Unknown (10/18/2023)   Social Connection and Isolation Panel [NHANES]    Frequency of Communication with Friends and Family: Patient declined    Frequency of Social Gatherings with Friends and Family: Patient declined    Attends Religious Services: Patient declined    Database administrator or  Organizations: Patient declined    Attends Engineer, structural: More than 4 times per year    Marital Status: Divorced  Intimate Partner Violence: Not At Risk (02/15/2023)   Humiliation, Afraid, Rape, and Kick questionnaire    Fear of Current or Ex-Partner: No    Emotionally Abused: No    Physically Abused: No    Sexually Abused: No    Outpatient Encounter Medications as of 10/21/2023  Medication Sig   aspirin EC 81 MG tablet Take 81 mg by mouth daily at 2 PM.   brimonidine-timolol (COMBIGAN) 0.2-0.5 % ophthalmic solution Place 1 drop into both eyes 2 (two) times daily.    cholecalciferol (VITAMIN D) 1000 units tablet Take 1,000 Units by mouth daily.   dapagliflozin propanediol (FARXIGA) 10 MG TABS tablet Take 1 tablet (10 mg total) by mouth daily before breakfast.   enalapril (VASOTEC) 10 MG tablet Take 1 tablet (10 mg total) by mouth 2 (two)  times daily.   hydrochlorothiazide (HYDRODIURIL) 25 MG tablet TAKE ONE TABLET EVERY DAY   latanoprost (XALATAN) 0.005 % ophthalmic solution Place 1 drop into both eyes at bedtime.    Multiple Vitamin (MULTIVITAMIN WITH MINERALS) TABS tablet Take 1 tablet by mouth daily.   valACYclovir (VALTREX) 1000 MG tablet Take 1,000 mg by mouth 2 (two) times daily.   [DISCONTINUED] OVER THE COUNTER MEDICATION Valaren cream   [DISCONTINUED] sitaGLIPtin-metformin (JANUMET) 50-1000 MG tablet Take 1 tablet by mouth 2 (two) times daily with a meal.   No facility-administered encounter medications on file as of 10/21/2023.    Allergies  Allergen Reactions   Benzalkonium Chloride Other (See Comments)    Other Reaction(s): Unknown   Benzalkonium Other (See Comments)   Neomycin-Bacitracin Zn-Polymyx     REACTION: "Severe skin rash"   Pravastatin Sodium     REACTION: "Funny feeling and leg pain"   Neomy-Bacit-Polymyx-Pramoxine Rash   Rosuvastatin Rash    Pertinent ROS per HPI, otherwise unremarkable      Objective:  BP 135/70   Pulse 65   Temp  (!) 97.3 F (36.3 C) (Oral)   Resp 20   Ht 5\' 6"  (1.676 m)   Wt 141 lb 6 oz (64.1 kg)   SpO2 97%   BMI 22.82 kg/m    Wt Readings from Last 3 Encounters:  10/21/23 141 lb 6 oz (64.1 kg)  07/21/23 144 lb 6.4 oz (65.5 kg)  05/30/23 149 lb 9.6 oz (67.9 kg)    Physical Exam Vitals and nursing note reviewed.  Constitutional:      General: She is not in acute distress.    Appearance: Normal appearance. She is normal weight. She is not ill-appearing, toxic-appearing or diaphoretic.  HENT:     Head: Normocephalic and atraumatic.     Nose: Nose normal.     Mouth/Throat:     Mouth: Mucous membranes are moist.  Eyes:     Conjunctiva/sclera: Conjunctivae normal.     Pupils: Pupils are equal, round, and reactive to light.  Cardiovascular:     Rate and Rhythm: Normal rate and regular rhythm.     Heart sounds: Normal heart sounds.  Pulmonary:     Effort: Pulmonary effort is normal.     Breath sounds: Normal breath sounds.  Abdominal:     General: Bowel sounds are normal.  Musculoskeletal:     Cervical back: Neck supple.     Right lower leg: No edema.     Left lower leg: No edema.  Skin:    General: Skin is warm and dry.     Capillary Refill: Capillary refill takes less than 2 seconds.  Neurological:     General: No focal deficit present.     Mental Status: She is alert and oriented to person, place, and time.     Gait: Gait abnormal (using walker).  Psychiatric:        Mood and Affect: Mood normal.        Behavior: Behavior normal.        Thought Content: Thought content normal.        Judgment: Judgment normal.    Physical Exam   MEASUREMENTS: BMI- 23 CHEST: Breath sounds clear bilaterally without wheezes, rales, or rhonchi. EXTREMITIES: No edema in legs.        Results for orders placed or performed in visit on 07/21/23  CBC with Differential/Platelet  Result Value Ref Range   WBC 7.4 3.4 - 10.8 x10E3/uL   RBC  3.16 (L) 3.77 - 5.28 x10E6/uL   Hemoglobin 10.9 (L)  11.1 - 15.9 g/dL   Hematocrit 21.3 (L) 08.6 - 46.6 %   MCV 105 (H) 79 - 97 fL   MCH 34.5 (H) 26.6 - 33.0 pg   MCHC 32.7 31.5 - 35.7 g/dL   RDW 57.8 46.9 - 62.9 %   Platelets 249 150 - 450 x10E3/uL   Neutrophils 76 Not Estab. %   Lymphs 20 Not Estab. %   Monocytes 4 Not Estab. %   Eos 0 Not Estab. %   Basos 0 Not Estab. %   Neutrophils Absolute 5.5 1.4 - 7.0 x10E3/uL   Lymphocytes Absolute 1.5 0.7 - 3.1 x10E3/uL   Monocytes Absolute 0.3 0.1 - 0.9 x10E3/uL   EOS (ABSOLUTE) 0.0 0.0 - 0.4 x10E3/uL   Basophils Absolute 0.0 0.0 - 0.2 x10E3/uL   Immature Granulocytes 0 Not Estab. %   Immature Grans (Abs) 0.0 0.0 - 0.1 x10E3/uL  CMP14+EGFR  Result Value Ref Range   Glucose 171 (H) 70 - 99 mg/dL   BUN 34 (H) 8 - 27 mg/dL   Creatinine, Ser 5.28 (H) 0.57 - 1.00 mg/dL   eGFR 30 (L) >41 LK/GMW/1.02   BUN/Creatinine Ratio 21 12 - 28   Sodium 137 134 - 144 mmol/L   Potassium 4.6 3.5 - 5.2 mmol/L   Chloride 100 96 - 106 mmol/L   CO2 23 20 - 29 mmol/L   Calcium 10.3 8.7 - 10.3 mg/dL   Total Protein 6.8 6.0 - 8.5 g/dL   Albumin 4.3 3.7 - 4.7 g/dL   Globulin, Total 2.5 1.5 - 4.5 g/dL   Bilirubin Total 0.4 0.0 - 1.2 mg/dL   Alkaline Phosphatase 91 44 - 121 IU/L   AST 17 0 - 40 IU/L   ALT 14 0 - 32 IU/L  Thyroid Panel With TSH  Result Value Ref Range   TSH 1.580 0.450 - 4.500 uIU/mL   T4, Total 4.0 (L) 4.5 - 12.0 ug/dL   T3 Uptake Ratio 32 24 - 39 %   Free Thyroxine Index 1.3 1.2 - 4.9  Bayer DCA Hb A1c Waived  Result Value Ref Range   HB A1C (BAYER DCA - WAIVED) 6.5 (H) 4.8 - 5.6 %  Anemia Profile B  Result Value Ref Range   Total Iron Binding Capacity 347 250 - 450 ug/dL   UIBC 725 366 - 440 ug/dL   Iron 72 27 - 347 ug/dL   Iron Saturation 21 15 - 55 %   Ferritin 72 15 - 150 ng/mL   Vitamin B-12 492 232 - 1,245 pg/mL   Folate >20.0 >3.0 ng/mL   WBC CANCELED x10E3/uL   RBC CANCELED x10E6/uL   Hemoglobin CANCELED g/dL   Hematocrit CANCELED %   MCV CANCELED fL   MCH CANCELED pg    MCHC CANCELED g/dL   RDW CANCELED %   Platelets CANCELED x10E3/uL   Neutrophils CANCELED %   Lymphs CANCELED %   Monocytes CANCELED %   Eos CANCELED %   Basos CANCELED %   Immature Cells CANCELED    Neutrophils Absolute CANCELED x10E3/uL   Lymphocytes Absolute CANCELED x10E3/uL   Monocytes Absolute CANCELED x10E3/uL   EOS (ABSOLUTE) CANCELED x10E3/uL   Basophils Absolute CANCELED x10E3/uL   Immature Granulocytes CANCELED %   Immature Grans (Abs) CANCELED x10E3/uL   NRBC CANCELED %   Hematology Comments: CANCELED    Retic Ct Pct CANCELED %  Specimen status report  Result Value  Ref Range   specimen status report Comment        Pertinent labs & imaging results that were available during my care of the patient were reviewed by me and considered in my medical decision making.  Assessment & Plan:  [place] was seen today for medical management of chronic issues.  Diagnoses and all orders for this visit:  Type 2 diabetes mellitus with other specified complication, without long-term current use of insulin (HCC) -     CBC with Differential/Platelet -     CMP14+EGFR -     Lipid panel -     Thyroid Panel With TSH -     Microalbumin / creatinine urine ratio -     Bayer DCA Hb A1c Waived   Hypertension associated with diabetes (HCC) -     CBC with Differential/Platelet -     CMP14+EGFR -     Lipid panel -     Thyroid Panel With TSH -     Microalbumin / creatinine urine ratio -     Bayer DCA Hb A1c Waived  CKD stage 3 due to type 2 diabetes mellitus (HCC) -     CBC with Differential/Platelet -     CMP14+EGFR -     Lipid panel -     Thyroid Panel With TSH -     Microalbumin / creatinine urine ratio -     Bayer DCA Hb A1c Waived  Hyperlipidemia associated with type 2 diabetes mellitus (HCC) -     CBC with Differential/Platelet -     CMP14+EGFR -     Lipid panel -     Thyroid Panel With TSH -     Microalbumin / creatinine urine ratio -     Bayer DCA Hb A1c  Waived   Paresthesia of both hands -     Vitamin B12      Assessment and Plan    Hip Pain Chronic hip pain, recently treated with an injection on Tuesday. Gradual improvement noted, still using a walker. No adverse reactions to the injection. Discussed benefits of physical therapy for improved mobility and pain reduction. - Resume physical therapy next week  Increased Urination Likely secondary to glucose excretion agent and hydrochlorothiazide. No new symptoms. Explained increased urination as a common side effect aiding in blood pressure and glucose management. - Continue current medications  Diabetes Mellitus Management includes dietary modifications and glucose excretion agent. Awaiting updated A1c levels. Emphasized importance of diet in managing blood glucose and preventing complications. - Review A1c results when available - Continue current diabetes management plan  Hypertension Managed with enalapril (Vasotec) and hydrochlorothiazide. No side effects reported. Explained enalapril's role in blood pressure control and cardiovascular risk reduction. - Continue enalapril (Vasotec) twice daily  Possible Vitamin B12 Deficiency Numbness and tingling in hands for two weeks. Differential includes vitamin B12 deficiency or iron deficiency anemia. Plan to check B12 levels. Discussed potential need for monthly B12 injections if levels are low. - Add vitamin B12 level to current labs - Consider monthly B12 injections if levels are low  General Health Maintenance BMI normal. No vision or hearing changes. Ongoing dental care with plans for partial denture. Discussed importance of maintaining healthy weight and routine dental care. - Continue routine dental care - Maintain current weight and healthy diet  Follow-up - Review lab results on Monday - Follow up with eye doctor on November 11th.          Continue all other maintenance medications.  Follow up plan: Return in about  3 months (around 01/21/2024) for DM.   Continue healthy lifestyle choices, including diet (rich in fruits, vegetables, and lean proteins, and low in salt and simple carbohydrates) and exercise (at least 30 minutes of moderate physical activity daily).  Educational handout given for DM  The above assessment and management plan was discussed with the patient. The patient verbalized understanding of and has agreed to the management plan. Patient is aware to call the clinic if they develop any new symptoms or if symptoms persist or worsen. Patient is aware when to return to the clinic for a follow-up visit. Patient educated on when it is appropriate to go to the emergency department.   Kari Baars, FNP-C [facility] [phone]

## 2023-10-21 NOTE — Telephone Encounter (Signed)
Left message to call back  

## 2023-10-22 LAB — LIPID PANEL
Chol/HDL Ratio: 2.1 ratio (ref 0.0–4.4)
Cholesterol, Total: 206 mg/dL — ABNORMAL HIGH (ref 100–199)
HDL: 99 mg/dL (ref >39)
LDL Chol Calc (NIH): 82 mg/dL (ref 0–99)
Triglycerides: 151 mg/dL — ABNORMAL HIGH (ref 0–149)
VLDL Cholesterol Cal: 25 mg/dL (ref 5–40)

## 2023-10-22 LAB — CBC WITH DIFFERENTIAL/PLATELET
Basophils Absolute: 0 10*3/uL (ref 0.0–0.2)
Basos: 0 %
EOS (ABSOLUTE): 0 10*3/uL (ref 0.0–0.4)
Eos: 0 %
Hematocrit: 38.9 % (ref 34.0–46.6)
Hemoglobin: 12.9 g/dL (ref 11.1–15.9)
Immature Grans (Abs): 0 10*3/uL (ref 0.0–0.1)
Immature Granulocytes: 0 %
Lymphocytes Absolute: 1.7 10*3/uL (ref 0.7–3.1)
Lymphs: 21 %
MCH: 35.1 pg — ABNORMAL HIGH (ref 26.6–33.0)
MCHC: 33.2 g/dL (ref 31.5–35.7)
MCV: 106 fL — ABNORMAL HIGH (ref 79–97)
Monocytes Absolute: 0.5 10*3/uL (ref 0.1–0.9)
Monocytes: 6 %
Neutrophils Absolute: 5.9 10*3/uL (ref 1.4–7.0)
Neutrophils: 73 %
Platelets: 248 10*3/uL (ref 150–450)
RBC: 3.67 x10E6/uL — ABNORMAL LOW (ref 3.77–5.28)
RDW: 12.7 % (ref 11.7–15.4)
WBC: 8.3 10*3/uL (ref 3.4–10.8)

## 2023-10-22 LAB — CMP14+EGFR
ALT: 19 IU/L (ref 0–32)
AST: 22 IU/L (ref 0–40)
Albumin: 4.3 g/dL (ref 3.7–4.7)
Alkaline Phosphatase: 107 [IU]/L (ref 44–121)
BUN/Creatinine Ratio: 25 (ref 12–28)
BUN: 40 mg/dL — ABNORMAL HIGH (ref 8–27)
Bilirubin Total: 0.3 mg/dL (ref 0.0–1.2)
CO2: 23 mmol/L (ref 20–29)
Calcium: 9.8 mg/dL (ref 8.7–10.3)
Chloride: 96 mmol/L (ref 96–106)
Creatinine, Ser: 1.6 mg/dL — ABNORMAL HIGH (ref 0.57–1.00)
Globulin, Total: 2.8 g/dL (ref 1.5–4.5)
Glucose: 224 mg/dL — ABNORMAL HIGH (ref 70–99)
Potassium: 4.3 mmol/L (ref 3.5–5.2)
Sodium: 134 mmol/L (ref 134–144)
Total Protein: 7.1 g/dL (ref 6.0–8.5)
eGFR: 31 mL/min/{1.73_m2} — ABNORMAL LOW (ref >59)

## 2023-10-22 LAB — MICROALBUMIN / CREATININE URINE RATIO
Creatinine, Urine: 29.7 mg/dL
Microalb/Creat Ratio: 41 mg/g{creat} — ABNORMAL HIGH (ref 0–29)
Microalbumin, Urine: 12.3 ug/mL

## 2023-10-22 LAB — THYROID PANEL WITH TSH
Free Thyroxine Index: 1.4 (ref 1.2–4.9)
T3 Uptake Ratio: 34 % (ref 24–39)
T4, Total: 4.2 ug/dL — ABNORMAL LOW (ref 4.5–12.0)
TSH: 1.15 u[IU]/mL (ref 0.450–4.500)

## 2023-10-24 LAB — SPECIMEN STATUS REPORT

## 2023-10-24 LAB — VITAMIN B12: Vitamin B-12: 618 pg/mL (ref 232–1245)

## 2023-10-25 ENCOUNTER — Telehealth: Payer: Self-pay | Admitting: Family Medicine

## 2023-10-25 NOTE — Telephone Encounter (Signed)
Copied from CRM [id number]. Topic: General - Other >> Oct 25, 2023  1:14 PM Amy B wrote: Reason for CRM: Patient states she received a call but did not answer because she was driving.  She states she will be home after 1:30.  Chart shows Kirkland Hun, LPN as caller.

## 2023-10-25 NOTE — Telephone Encounter (Signed)
Called patient on labs she is aware

## 2023-10-27 DIAGNOSIS — I10 Essential (primary) hypertension: Secondary | ICD-10-CM | POA: Diagnosis not present

## 2023-10-27 DIAGNOSIS — E119 Type 2 diabetes mellitus without complications: Secondary | ICD-10-CM | POA: Diagnosis not present

## 2023-10-29 DIAGNOSIS — E119 Type 2 diabetes mellitus without complications: Secondary | ICD-10-CM | POA: Diagnosis not present

## 2023-10-29 DIAGNOSIS — I10 Essential (primary) hypertension: Secondary | ICD-10-CM | POA: Diagnosis not present

## 2023-11-09 ENCOUNTER — Ambulatory Visit: Payer: 59 | Attending: Family Medicine

## 2023-11-09 DIAGNOSIS — M79605 Pain in left leg: Secondary | ICD-10-CM

## 2023-11-09 DIAGNOSIS — M6281 Muscle weakness (generalized): Secondary | ICD-10-CM | POA: Diagnosis not present

## 2023-11-09 DIAGNOSIS — R262 Difficulty in walking, not elsewhere classified: Secondary | ICD-10-CM

## 2023-11-09 NOTE — Therapy (Signed)
OUTPATIENT PHYSICAL THERAPY LOWER EXTREMITY TREATMENT   Patient Name: Candice Brewer MRN: [id number] DOB:11-04-1932, 89 y.o., female Today's Date: 11/09/2023  END OF SESSION:  PT End of Session - 11/09/23 1312     Visit Number 10    Number of Visits 12    Date for PT Re-Evaluation 12/02/23    PT Start Time 1300    PT Stop Time 1345    PT Time Calculation (min) 45 min    Activity Tolerance Patient tolerated treatment well    Behavior During Therapy [facility] for tasks assessed/performed                      Past Medical History:  Diagnosis Date   Arthritis    Chest pain, unspecified    Constipation    DM (diabetes mellitus) (HCC)    Dry eyes    Esophageal stricture 09/2011   schatzki's ring   Fibroids    uterus   Glaucoma    Helicobacter pylori gastritis 2012   Treated   HTN (hypertension)    Hyperlipidemia    Restless legs syndrome (RLS)    Vitamin D deficiency    Past Surgical History:  Procedure Laterality Date   APPENDECTOMY     BREAST BIOPSY Left 03/20/2018   Procedure: BREAST BIOPSY WITH NEEDLE LOCALIZATION;  Surgeon: Lucretia Roers, MD;  Location: [facility];  Service: General;  Laterality: Left;   BREAST EXCISIONAL BIOPSY Left 2019   Atypical hyperplasia removed   CARDIOVASCULAR STRESS TEST  2009   CATARACT EXTRACTION  2010   right eye   CATARACT EXTRACTION  2012   left eye   COLONOSCOPY  08/2006   Dr. Elmer Ramp   COLONOSCOPY  10/13/2011   Normal. Next TCS 09/2016.   Procedure: COLONOSCOPY;  Surgeon: Corbin Ade, MD;  Location: [facility];  Service: Endoscopy;  Laterality: N/A;  8:15   COLONOSCOPY N/A 03/10/2015   RMR: External and anal canal hemorrhoids likely source of hematochezia. Redundant colon. Single sigmoid polyp removed ad described above.    ESOPHAGOGASTRODUODENOSCOPY  06/2008   Dr. Sinda Du ring s/p disruption, erosive reflux esophagitis, hh.    ESOPHAGOGASTRODUODENOSCOPY  09/2011   Schatki's ring s/p dilation,  multiple 1-70mm antral and bulbar erosions, bx positive for H.Pylori s/ treatment   TUBAL LIGATION     Patient Active Problem List   Diagnosis Date Noted   CKD stage 3 due to type 2 diabetes mellitus (HCC) 10/21/2023   Paresthesia of both hands 10/21/2023   Pain in left hip 09/06/2023   Pain in left knee 09/06/2023   Vitamin D deficiency 04/20/2023   Schatzki's ring 01/03/2012    Class: History of   FH: colon cancer 09/16/2011   Esophageal dysphagia 09/16/2011   CATARACT, LEFT EYE 06/20/2009   CALLUSES, FEET, BILATERAL 06/20/2009   Hyperlipidemia associated with type 2 diabetes mellitus (HCC) 02/08/2008   Type 2 diabetes mellitus (HCC) 01/20/2007   Hypertension associated with diabetes (HCC) 10/28/2006   REFERRING PROVIDER: Sonny Masters, FNP   REFERRING DIAG: Left leg pain   THERAPY DIAG:  Pain in left leg  Difficulty in walking, not elsewhere classified  Muscle weakness (generalized)  Rationale for Evaluation and Treatment: Rehabilitation  ONSET DATE: December 2023   SUBJECTIVE:   SUBJECTIVE STATEMENT: Patient reports that she is hurting some today, but her HEP is helping. She had an injection recently, but this did not help any. She notes that her pan can get so  bad that it can cause her to fall if she doesn't have her walker.   PERTINENT HISTORY: Hypertension, diabetes, and arthritis PAIN:  Are you having pain? Yes: NPRS scale: "a little" /10 Pain location: left hip and knee Pain description: intermittent sharp pain  Aggravating factors: standing, walking, and laying on her left side Relieving factors: sitting down   PRECAUTIONS: Fall  RED FLAGS: None   WEIGHT BEARING RESTRICTIONS: No  FALLS:  Has patient fallen in last 6 months? Yes. Number of falls 2  LIVING ENVIRONMENT: Lives with: lives with their daughter Lives in: House/apartment Stairs: Yes: External: 1 steps; none; she has other steps at home, but she does not go up or down these steps  anymore Has following equipment at home: None  OCCUPATION: retired  PLOF: Independent  PATIENT GOALS: reduced pain and be able to go back to her gym program  NEXT MD VISIT: 10/21/23  OBJECTIVE: all objective measures were assessed at her initial evaluation on 08/08/23 unless otherwise noted  COGNITION: Overall cognitive status: Within functional limits for tasks assessed     SENSATION: Patient reports no numbness or tingling  EDEMA:  No edema observed  PALPATION: TTP: left gastroc/soleus, quadriceps, hamstrings, popliteal fossa, IT band, TFL, and hip adductors  LOWER EXTREMITY ROM: PROM was attempted to be assessed but unable to be completed due to pain severity  Active ROM Right eval Left eval  Hip flexion 91 77; familiar pain  Hip extension    Hip abduction    Hip adduction    Hip internal rotation    Hip external rotation    Knee flexion    Knee extension    Ankle dorsiflexion    Ankle plantarflexion    Ankle inversion    Ankle eversion     (Blank rows = not tested)  LOWER EXTREMITY MMT:  MMT Right eval Left eval  Hip flexion 3+/5 4-/5; slight pain  Hip extension    Hip abduction    Hip adduction    Hip internal rotation    Hip external rotation    Knee flexion 5/5 4+/5  Knee extension 5/5 4+/5; familiar knee pain   Ankle dorsiflexion 4/5 4/5  Ankle plantarflexion    Ankle inversion    Ankle eversion     (Blank rows = not tested)  FUNCTIONAL TESTS:  5 times sit to stand: 23.59 seconds with upper extremity support from armrests Timed up and go (TUG): 21.64 seconds Required UE support for sit to stand transfers  GAIT: Assistive device utilized: None Level of assistance: Complete Independence Comments: step to pattern with decreased stance time and stride length on LLE    TODAY'S TREATMENT:                                                                                                                              DATE:  11/09/23 EXERCISE LOG  Exercise Repetitions and Resistance Comments  Nustep  L4 x 20 minutes   Seated hip ADD isometric  3 minutes w/ 5 second hold   Rocker board (seated)  5 minutes            Blank cell = exercise not performed today                                    10/05/23 EXERCISE LOG  Exercise Repetitions and Resistance Comments  Nustep  L4 x 20 minutes    Side stepping on foam  10 laps  BUE support   Rocker board  3 minutes  BUE support   Lunges onto step  14" step x 3 minutes  LLE on step   Seated hip ADD isometric  3 minutes w/ 5 second hold     Blank cell = exercise not performed today                                    09/28/23 EXERCISE LOG  Exercise Repetitions and Resistance Comments  Nustep L4 x 20 minutes   Marching on foam 3 minutes  BUE support   Rocker board  4 minutes  BUE support   Supine march 3 minutes  Alternating LE        Blank cell = exercise not performed today   PATIENT EDUCATION:  Education details: progress with therapy, and exercise classes  Person educated: Patient Education method: Explanation Education comprehension: verbalized understanding  HOME EXERCISE PROGRAM:   ASSESSMENT:  CLINICAL IMPRESSION: Patient has made minimal progress with skilled physical therapy since her last progress report on 09/28/23. This evidenced by her continued pain and objective measures such as her timed up and go and five time sit to stand times. However, this can partially be attributed due to her inability to attend physical therapy since 10/05/23 as she was out of town. Today's interventions focused on familiar interventions for improved lower extremity strength with moderate difficulty. She was educated on the benefits of returning to exercise classes to continue to improve her functional mobility. She reported feeling better upon the conclusion of treatment. Recommend that she complete her current plan of care prior to being reassessed to  evaluate for a plateau in progress.   OBJECTIVE IMPAIRMENTS: Abnormal gait, decreased activity tolerance, decreased balance, decreased mobility, difficulty walking, decreased ROM, decreased strength, hypomobility, impaired flexibility, impaired tone, and pain.   ACTIVITY LIMITATIONS: carrying, lifting, standing, squatting, sleeping, stairs, transfers, and locomotion level  PARTICIPATION LIMITATIONS: meal prep, cleaning, laundry, shopping, and community activity  PERSONAL FACTORS: Age, Past/current experiences, Time since onset of injury/illness/exacerbation, and 3+ comorbidities: Hypertension, diabetes, and arthritis  are also affecting patient's functional outcome.   REHAB POTENTIAL: Good  CLINICAL DECISION MAKING: Evolving/moderate complexity  EVALUATION COMPLEXITY: Moderate   GOALS: Goals reviewed with patient? Yes  SHORT TERM GOALS: Target date: 08/29/23 Patient will be independent with her initial HEP. Baseline: Goal status: MET  2.  Patient will improve her left hip flexion to at least 85 degrees for improved hip mobility. Baseline: 88 degrees with minimal pain at end range Goal status: MET  3.  Patient will improve her 5 times sit to stand time to 18 seconds or less for improved lower extremity power. Baseline: 26.66 seconds with upper extremity support from arm rests;  27.89 seconds on 11/09/23 with UE support from arm rests Goal status: ON GOING  LONG TERM GOALS: Target date: 09/19/23  Patient will be independent with her advanced HEP. Baseline:  Goal status: ON GOING  2.  Patient will improve her 5 times sit to stand time to 15 seconds or less to reduce her fall risk. Baseline: 26.66 seconds with upper extremity support from arm rests; 27.89 seconds on 11/09/23 with UE support from arm rests Goal status: ON GOING  3.  Patient will improve her timed up and go time to 15 seconds or less for improved functional mobility. Baseline: 24.98 seconds; 33.62 seconds with  rolling walker on 11/09/23 Goal status: ON GOING  4.  Patient will be able to ambulate with no significant gait deviations. Baseline: continues to exhibit reduced step length and absent heel strike and toe off  Goal status: ON GOING  5.  Patient will be able to complete her daily activities without her familiar left lower extremity pain exceeding 3/10. Baseline: continues to experience left hip pain Goal status: ON GOING  PLAN:  PT FREQUENCY: 2x/week  PT DURATION: 6 weeks  PLANNED INTERVENTIONS: Therapeutic exercises, Therapeutic activity, Neuromuscular re-education, Balance training, Gait training, Patient/Family education, Self Care, Joint mobilization, Stair training, Electrical stimulation, Cryotherapy, Moist heat, Vasopneumatic device, Manual therapy, and Re-evaluation  PLAN FOR NEXT SESSION: NuStep, isometrics, manual therapy, and modalities as needed   Granville Lewis, PT 11/09/2023, 2:12 PM

## 2023-11-16 ENCOUNTER — Ambulatory Visit: Payer: 59

## 2023-11-16 DIAGNOSIS — M6281 Muscle weakness (generalized): Secondary | ICD-10-CM

## 2023-11-16 DIAGNOSIS — M79605 Pain in left leg: Secondary | ICD-10-CM

## 2023-11-16 DIAGNOSIS — R262 Difficulty in walking, not elsewhere classified: Secondary | ICD-10-CM | POA: Diagnosis not present

## 2023-11-16 NOTE — Therapy (Signed)
OUTPATIENT PHYSICAL THERAPY LOWER EXTREMITY TREATMENT   Patient Name: Candice Brewer MRN: [id number] DOB:12-09-1931, 89 y.o., female Today's Date: 11/16/2023  END OF SESSION:  PT End of Session - 11/16/23 1306     Visit Number 11    Number of Visits 12    Date for PT Re-Evaluation 12/02/23    PT Start Time 1304    PT Stop Time 1347    PT Time Calculation (min) 43 min    Activity Tolerance Patient tolerated treatment well    Behavior During Therapy [facility] for tasks assessed/performed                      Past Medical History:  Diagnosis Date   Arthritis    Chest pain, unspecified    Constipation    DM (diabetes mellitus) (HCC)    Dry eyes    Esophageal stricture 09/2011   schatzki's ring   Fibroids    uterus   Glaucoma    Helicobacter pylori gastritis 2012   Treated   HTN (hypertension)    Hyperlipidemia    Restless legs syndrome (RLS)    Vitamin D deficiency    Past Surgical History:  Procedure Laterality Date   APPENDECTOMY     BREAST BIOPSY Left 03/20/2018   Procedure: BREAST BIOPSY WITH NEEDLE LOCALIZATION;  Surgeon: Lucretia Roers, MD;  Location: [facility];  Service: General;  Laterality: Left;   BREAST EXCISIONAL BIOPSY Left 2019   Atypical hyperplasia removed   CARDIOVASCULAR STRESS TEST  2009   CATARACT EXTRACTION  2010   right eye   CATARACT EXTRACTION  2012   left eye   COLONOSCOPY  08/2006   Dr. Elmer Ramp   COLONOSCOPY  10/13/2011   Normal. Next TCS 09/2016.   Procedure: COLONOSCOPY;  Surgeon: Corbin Ade, MD;  Location: [facility];  Service: Endoscopy;  Laterality: N/A;  8:15   COLONOSCOPY N/A 03/10/2015   RMR: External and anal canal hemorrhoids likely source of hematochezia. Redundant colon. Single sigmoid polyp removed ad described above.    ESOPHAGOGASTRODUODENOSCOPY  06/2008   Dr. Sinda Du ring s/p disruption, erosive reflux esophagitis, hh.    ESOPHAGOGASTRODUODENOSCOPY  09/2011   Schatki's ring s/p dilation,  multiple 1-18mm antral and bulbar erosions, bx positive for H.Pylori s/ treatment   TUBAL LIGATION     Patient Active Problem List   Diagnosis Date Noted   CKD stage 3 due to type 2 diabetes mellitus (HCC) 10/21/2023   Paresthesia of both hands 10/21/2023   Pain in left hip 09/06/2023   Pain in left knee 09/06/2023   Vitamin D deficiency 04/20/2023   Schatzki's ring 01/03/2012    Class: History of   FH: colon cancer 09/16/2011   Esophageal dysphagia 09/16/2011   CATARACT, LEFT EYE 06/20/2009   CALLUSES, FEET, BILATERAL 06/20/2009   Hyperlipidemia associated with type 2 diabetes mellitus (HCC) 02/08/2008   Type 2 diabetes mellitus (HCC) 01/20/2007   Hypertension associated with diabetes (HCC) 10/28/2006   REFERRING PROVIDER: Sonny Masters, FNP   REFERRING DIAG: Left leg pain   THERAPY DIAG:  Pain in left leg  Difficulty in walking, not elsewhere classified  Muscle weakness (generalized)  Rationale for Evaluation and Treatment: Rehabilitation  ONSET DATE: December 2023   SUBJECTIVE:   SUBJECTIVE STATEMENT: Pt reports 10/10 left hip pain, but then states pain is actually closer to 8/10.   PERTINENT HISTORY: Hypertension, diabetes, and arthritis PAIN:  Are you having pain? Yes: NPRS scale:  8/10 Pain location: left hip Pain description: intermittent sharp pain  Aggravating factors: standing, walking, and laying on her left side Relieving factors: sitting down   PRECAUTIONS: Fall  RED FLAGS: None   WEIGHT BEARING RESTRICTIONS: No  FALLS:  Has patient fallen in last 6 months? Yes. Number of falls 2  LIVING ENVIRONMENT: Lives with: lives with their daughter Lives in: House/apartment Stairs: Yes: External: 1 steps; none; she has other steps at home, but she does not go up or down these steps anymore Has following equipment at home: None  OCCUPATION: retired  PLOF: Independent  PATIENT GOALS: reduced pain and be able to go back to her gym program  NEXT MD  VISIT: 10/21/23  OBJECTIVE: all objective measures were assessed at her initial evaluation on 08/08/23 unless otherwise noted  COGNITION: Overall cognitive status: Within functional limits for tasks assessed     SENSATION: Patient reports no numbness or tingling  EDEMA:  No edema observed  PALPATION: TTP: left gastroc/soleus, quadriceps, hamstrings, popliteal fossa, IT band, TFL, and hip adductors  LOWER EXTREMITY ROM: PROM was attempted to be assessed but unable to be completed due to pain severity  Active ROM Right eval Left eval  Hip flexion 91 77; familiar pain  Hip extension    Hip abduction    Hip adduction    Hip internal rotation    Hip external rotation    Knee flexion    Knee extension    Ankle dorsiflexion    Ankle plantarflexion    Ankle inversion    Ankle eversion     (Blank rows = not tested)  LOWER EXTREMITY MMT:  MMT Right eval Left eval  Hip flexion 3+/5 4-/5; slight pain  Hip extension    Hip abduction    Hip adduction    Hip internal rotation    Hip external rotation    Knee flexion 5/5 4+/5  Knee extension 5/5 4+/5; familiar knee pain   Ankle dorsiflexion 4/5 4/5  Ankle plantarflexion    Ankle inversion    Ankle eversion     (Blank rows = not tested)  FUNCTIONAL TESTS:  5 times sit to stand: 23.59 seconds with upper extremity support from armrests Timed up and go (TUG): 21.64 seconds Required UE support for sit to stand transfers  GAIT: Assistive device utilized: None Level of assistance: Complete Independence Comments: step to pattern with decreased stance time and stride length on LLE    TODAY'S TREATMENT:                                                                                                                              DATE:                                     11/16/23 EXERCISE LOG  Exercise Repetitions and Resistance Comments  Nustep  L4 x  20 minutes   Seated hip ADD isometric  3 minutes w/ 5 second hold   Rocker  board (standing)  5 minutes   Seated hip Abduction Red x 3 mins        Blank cell = exercise not performed today                                    10/05/23 EXERCISE LOG  Exercise Repetitions and Resistance Comments  Nustep  L4 x 20 minutes    Side stepping on foam  10 laps  BUE support   Rocker board  3 minutes  BUE support   Lunges onto step  14" step x 3 minutes  LLE on step   Seated hip ADD isometric  3 minutes w/ 5 second hold     Blank cell = exercise not performed today                                    09/28/23 EXERCISE LOG  Exercise Repetitions and Resistance Comments  Nustep L4 x 20 minutes   Marching on foam 3 minutes  BUE support   Rocker board  4 minutes  BUE support   Supine march 3 minutes  Alternating LE        Blank cell = exercise not performed today   PATIENT EDUCATION:  Education details: progress with therapy, and exercise classes  Person educated: Patient Education method: Explanation Education comprehension: verbalized understanding  HOME EXERCISE PROGRAM:   ASSESSMENT:  CLINICAL IMPRESSION: Pt arrives for today's treatment session reporting 8/10 left hip pain.  Reviewed previously performed exercises with addition of seated hip abduction.  Pt educated the importance of pacing herself and activity planning throughout the week to combat fatigue and decreased activity tolerance.  Pt denied any change in pain at completion of today's treatment session.  OBJECTIVE IMPAIRMENTS: Abnormal gait, decreased activity tolerance, decreased balance, decreased mobility, difficulty walking, decreased ROM, decreased strength, hypomobility, impaired flexibility, impaired tone, and pain.   ACTIVITY LIMITATIONS: carrying, lifting, standing, squatting, sleeping, stairs, transfers, and locomotion level  PARTICIPATION LIMITATIONS: meal prep, cleaning, laundry, shopping, and community activity  PERSONAL FACTORS: Age, Past/current experiences, Time since onset of  injury/illness/exacerbation, and 3+ comorbidities: Hypertension, diabetes, and arthritis  are also affecting patient's functional outcome.   REHAB POTENTIAL: Good  CLINICAL DECISION MAKING: Evolving/moderate complexity  EVALUATION COMPLEXITY: Moderate   GOALS: Goals reviewed with patient? Yes  SHORT TERM GOALS: Target date: 08/29/23 Patient will be independent with her initial HEP. Baseline: Goal status: MET  2.  Patient will improve her left hip flexion to at least 85 degrees for improved hip mobility. Baseline: 88 degrees with minimal pain at end range Goal status: MET  3.  Patient will improve her 5 times sit to stand time to 18 seconds or less for improved lower extremity power. Baseline: 26.66 seconds with upper extremity support from arm rests; 27.89 seconds on 11/09/23 with UE support from arm rests Goal status: ON GOING  LONG TERM GOALS: Target date: 09/19/23  Patient will be independent with her advanced HEP. Baseline:  Goal status: ON GOING  2.  Patient will improve her 5 times sit to stand time to 15 seconds or less to reduce her fall risk. Baseline: 26.66 seconds with upper extremity support from arm rests; 27.89 seconds on 11/09/23 with UE  support from arm rests Goal status: ON GOING  3.  Patient will improve her timed up and go time to 15 seconds or less for improved functional mobility. Baseline: 24.98 seconds; 33.62 seconds with rolling walker on 11/09/23 Goal status: ON GOING  4.  Patient will be able to ambulate with no significant gait deviations. Baseline: continues to exhibit reduced step length and absent heel strike and toe off  Goal status: ON GOING  5.  Patient will be able to complete her daily activities without her familiar left lower extremity pain exceeding 3/10. Baseline: continues to experience left hip pain Goal status: ON GOING  PLAN:  PT FREQUENCY: 2x/week  PT DURATION: 6 weeks  PLANNED INTERVENTIONS: Therapeutic exercises,  Therapeutic activity, Neuromuscular re-education, Balance training, Gait training, Patient/Family education, Self Care, Joint mobilization, Stair training, Electrical stimulation, Cryotherapy, Moist heat, Vasopneumatic device, Manual therapy, and Re-evaluation  PLAN FOR NEXT SESSION: NuStep, isometrics, manual therapy, and modalities as needed   Newman Pies, PTA 11/16/2023, 2:28 PM

## 2023-11-26 DIAGNOSIS — E119 Type 2 diabetes mellitus without complications: Secondary | ICD-10-CM | POA: Diagnosis not present

## 2023-11-26 DIAGNOSIS — I10 Essential (primary) hypertension: Secondary | ICD-10-CM | POA: Diagnosis not present

## 2023-11-28 ENCOUNTER — Ambulatory Visit: Payer: 59

## 2023-11-28 DIAGNOSIS — M79605 Pain in left leg: Secondary | ICD-10-CM

## 2023-11-28 DIAGNOSIS — M6281 Muscle weakness (generalized): Secondary | ICD-10-CM

## 2023-11-28 DIAGNOSIS — R262 Difficulty in walking, not elsewhere classified: Secondary | ICD-10-CM | POA: Diagnosis not present

## 2023-11-28 NOTE — Therapy (Signed)
OUTPATIENT PHYSICAL THERAPY LOWER EXTREMITY TREATMENT   Patient Name: Candice Brewer MRN: [id number] DOB:09/30/1932, 89 y.o., female Today's Date: 11/28/2023  END OF SESSION:  PT End of Session - 11/28/23 1514     Visit Number 12    Number of Visits 12    Date for PT Re-Evaluation 12/02/23    PT Start Time 1515    PT Stop Time 1552    PT Time Calculation (min) 37 min    Activity Tolerance Patient tolerated treatment well    Behavior During Therapy [facility] for tasks assessed/performed                      Past Medical History:  Diagnosis Date   Arthritis    Chest pain, unspecified    Constipation    DM (diabetes mellitus) (HCC)    Dry eyes    Esophageal stricture 09/2011   schatzki's ring   Fibroids    uterus   Glaucoma    Helicobacter pylori gastritis 2012   Treated   HTN (hypertension)    Hyperlipidemia    Restless legs syndrome (RLS)    Vitamin D deficiency    Past Surgical History:  Procedure Laterality Date   APPENDECTOMY     BREAST BIOPSY Left 03/20/2018   Procedure: BREAST BIOPSY WITH NEEDLE LOCALIZATION;  Surgeon: Lucretia Roers, MD;  Location: [facility];  Service: General;  Laterality: Left;   BREAST EXCISIONAL BIOPSY Left 2019   Atypical hyperplasia removed   CARDIOVASCULAR STRESS TEST  2009   CATARACT EXTRACTION  2010   right eye   CATARACT EXTRACTION  2012   left eye   COLONOSCOPY  08/2006   Dr. Elmer Ramp   COLONOSCOPY  10/13/2011   Normal. Next TCS 09/2016.   Procedure: COLONOSCOPY;  Surgeon: Corbin Ade, MD;  Location: [facility];  Service: Endoscopy;  Laterality: N/A;  8:15   COLONOSCOPY N/A 03/10/2015   RMR: External and anal canal hemorrhoids likely source of hematochezia. Redundant colon. Single sigmoid polyp removed ad described above.    ESOPHAGOGASTRODUODENOSCOPY  06/2008   Dr. Sinda Du ring s/p disruption, erosive reflux esophagitis, hh.    ESOPHAGOGASTRODUODENOSCOPY  09/2011   Schatki's ring s/p dilation,  multiple 1-68mm antral and bulbar erosions, bx positive for H.Pylori s/ treatment   TUBAL LIGATION     Patient Active Problem List   Diagnosis Date Noted   CKD stage 3 due to type 2 diabetes mellitus (HCC) 10/21/2023   Paresthesia of both hands 10/21/2023   Pain in left hip 09/06/2023   Pain in left knee 09/06/2023   Vitamin D deficiency 04/20/2023   Schatzki's ring 01/03/2012    Class: History of   FH: colon cancer 09/16/2011   Esophageal dysphagia 09/16/2011   CATARACT, LEFT EYE 06/20/2009   CALLUSES, FEET, BILATERAL 06/20/2009   Hyperlipidemia associated with type 2 diabetes mellitus (HCC) 02/08/2008   Type 2 diabetes mellitus (HCC) 01/20/2007   Hypertension associated with diabetes (HCC) 10/28/2006   REFERRING PROVIDER: Sonny Masters, FNP   REFERRING DIAG: Left leg pain   THERAPY DIAG:  Pain in left leg  Difficulty in walking, not elsewhere classified  Muscle weakness (generalized)  Rationale for Evaluation and Treatment: Rehabilitation  ONSET DATE: December 2023   SUBJECTIVE:   SUBJECTIVE STATEMENT: Patient reports that she feels pretty good today, but her left hip is still hurting. She feels like she has gotten a lot better since starting therapy.   PERTINENT HISTORY: Hypertension,  diabetes, and arthritis PAIN:  Are you having pain? Yes: NPRS scale: 8/10 Pain location: left hip Pain description: intermittent sharp pain  Aggravating factors: standing, walking, and laying on her left side Relieving factors: sitting down   PRECAUTIONS: Fall  RED FLAGS: None   WEIGHT BEARING RESTRICTIONS: No  FALLS:  Has patient fallen in last 6 months? Yes. Number of falls 2  LIVING ENVIRONMENT: Lives with: lives with their daughter Lives in: House/apartment Stairs: Yes: External: 1 steps; none; she has other steps at home, but she does not go up or down these steps anymore Has following equipment at home: None  OCCUPATION: retired  PLOF: Independent  PATIENT  GOALS: reduced pain and be able to go back to her gym program  NEXT MD VISIT: 01/25/24  OBJECTIVE: all objective measures were assessed at her initial evaluation on 08/08/23 unless otherwise noted  COGNITION: Overall cognitive status: Within functional limits for tasks assessed     SENSATION: Patient reports no numbness or tingling  EDEMA:  No edema observed  PALPATION: TTP: left gastroc/soleus, quadriceps, hamstrings, popliteal fossa, IT band, TFL, and hip adductors  LOWER EXTREMITY ROM: PROM was attempted to be assessed but unable to be completed due to pain severity  Active ROM Right eval Left eval  Hip flexion 91 77; familiar pain  Hip extension    Hip abduction    Hip adduction    Hip internal rotation    Hip external rotation    Knee flexion    Knee extension    Ankle dorsiflexion    Ankle plantarflexion    Ankle inversion    Ankle eversion     (Blank rows = not tested)  LOWER EXTREMITY MMT:  MMT Right eval Left eval  Hip flexion 3+/5 4-/5; slight pain  Hip extension    Hip abduction    Hip adduction    Hip internal rotation    Hip external rotation    Knee flexion 5/5 4+/5  Knee extension 5/5 4+/5; familiar knee pain   Ankle dorsiflexion 4/5 4/5  Ankle plantarflexion    Ankle inversion    Ankle eversion     (Blank rows = not tested)  FUNCTIONAL TESTS:  5 times sit to stand: 23.59 seconds with upper extremity support from armrests Timed up and go (TUG): 21.64 seconds Required UE support for sit to stand transfers  GAIT: Assistive device utilized: None Level of assistance: Complete Independence Comments: step to pattern with decreased stance time and stride length on LLE    TODAY'S TREATMENT:                                                                                                                              DATE:                                     11/28/23 EXERCISE  LOG  Exercise Repetitions and Resistance Comments  Nustep  L4 x 20  minutes                    Blank cell = exercise not performed today                                    11/16/23 EXERCISE LOG  Exercise Repetitions and Resistance Comments  Nustep  L4 x 20 minutes   Seated hip ADD isometric  3 minutes w/ 5 second hold   Rocker board (standing)  5 minutes   Seated hip Abduction Red x 3 mins        Blank cell = exercise not performed today                                    10/05/23 EXERCISE LOG  Exercise Repetitions and Resistance Comments  Nustep  L4 x 20 minutes    Side stepping on foam  10 laps  BUE support   Rocker board  3 minutes  BUE support   Lunges onto step  14" step x 3 minutes  LLE on step   Seated hip ADD isometric  3 minutes w/ 5 second hold     Blank cell = exercise not performed today   PATIENT EDUCATION:  Education details: progress with therapy, HEP, and exercise classes  Person educated: Patient Education method: Explanation Education comprehension: verbalized understanding  HOME EXERCISE PROGRAM:   ASSESSMENT:  CLINICAL IMPRESSION: Patient has made fair progress with skilled physical therapy as evidenced by her subjective reports, objective measures, functional mobility, and progress toward her goals. She was able to partially meet her short and long term goals for physical therapy. She continues to experience intermittent levels of elevated pain, but she feels that it is significantly better than prior to physical therapy. Her HEP was reviewed and she felt comfortable with these interventions. She felt comfortable being discharged at this time with her HEP.   OBJECTIVE IMPAIRMENTS: Abnormal gait, decreased activity tolerance, decreased balance, decreased mobility, difficulty walking, decreased ROM, decreased strength, hypomobility, impaired flexibility, impaired tone, and pain.   ACTIVITY LIMITATIONS: carrying, lifting, standing, squatting, sleeping, stairs, transfers, and locomotion level  PARTICIPATION LIMITATIONS:  meal prep, cleaning, laundry, shopping, and community activity  PERSONAL FACTORS: Age, Past/current experiences, Time since onset of injury/illness/exacerbation, and 3+ comorbidities: Hypertension, diabetes, and arthritis  are also affecting patient's functional outcome.   REHAB POTENTIAL: Good  CLINICAL DECISION MAKING: Evolving/moderate complexity  EVALUATION COMPLEXITY: Moderate   GOALS: Goals reviewed with patient? Yes  SHORT TERM GOALS: Target date: 08/29/23 Patient will be independent with her initial HEP. Baseline: Goal status: MET  2.  Patient will improve her left hip flexion to at least 85 degrees for improved hip mobility. Baseline: 88 degrees with minimal pain at end range Goal status: MET  3.  Patient will improve her 5 times sit to stand time to 18 seconds or less for improved lower extremity power. Baseline: 26.66 seconds with upper extremity support from arm rests; 27.89 seconds on 11/09/23 with UE support from arm rests; 20.07 seconds on 11/28/23 with UE support from arm rests Goal status: NOT MET  LONG TERM GOALS: Target date: 09/19/23  Patient will be independent with her advanced HEP. Baseline:  Goal status: MET  2.  Patient will  improve her 5 times sit to stand time to 15 seconds or less to reduce her fall risk. Baseline: 26.66 seconds with upper extremity support from arm rests; 27.89 seconds on 11/09/23 with UE support from arm rests; 20.07 seconds on 11/28/23 with UE support from arm rests Goal status: NOT MET  3.  Patient will improve her timed up and go time to 15 seconds or less for improved functional mobility. Baseline: 24.98 seconds; 33.62 seconds with rolling walker on 11/09/23; 25.94 seconds on 11/28/23 with rolling walker Goal status: NOT MET  4.  Patient will be able to ambulate with no significant gait deviations. Baseline: continues to exhibit reduced step length and absent heel strike and toe off  Goal status: NOT MET  5.  Patient will  be able to complete her daily activities without her familiar left lower extremity pain exceeding 3/10. Baseline: continues to experience left hip pain Goal status: NOT MET   PLAN:  PT FREQUENCY: 2x/week  PT DURATION: 6 weeks  PLANNED INTERVENTIONS: Therapeutic exercises, Therapeutic activity, Neuromuscular re-education, Balance training, Gait training, Patient/Family education, Self Care, Joint mobilization, Stair training, Electrical stimulation, Cryotherapy, Moist heat, Vasopneumatic device, Manual therapy, and Re-evaluation  PLAN FOR NEXT SESSION: NuStep, isometrics, manual therapy, and modalities as needed   Granville Lewis, PT 11/28/2023, 4:15 PM

## 2023-11-29 DIAGNOSIS — I10 Essential (primary) hypertension: Secondary | ICD-10-CM | POA: Diagnosis not present

## 2023-11-29 DIAGNOSIS — E119 Type 2 diabetes mellitus without complications: Secondary | ICD-10-CM | POA: Diagnosis not present

## 2023-12-06 ENCOUNTER — Other Ambulatory Visit: Payer: Self-pay | Admitting: Family Medicine

## 2023-12-06 DIAGNOSIS — Z947 Corneal transplant status: Secondary | ICD-10-CM | POA: Diagnosis not present

## 2023-12-06 DIAGNOSIS — E119 Type 2 diabetes mellitus without complications: Secondary | ICD-10-CM | POA: Diagnosis not present

## 2023-12-06 DIAGNOSIS — H401131 Primary open-angle glaucoma, bilateral, mild stage: Secondary | ICD-10-CM | POA: Diagnosis not present

## 2023-12-06 DIAGNOSIS — I10 Essential (primary) hypertension: Secondary | ICD-10-CM

## 2023-12-22 DIAGNOSIS — L84 Corns and callosities: Secondary | ICD-10-CM | POA: Diagnosis not present

## 2023-12-22 DIAGNOSIS — B351 Tinea unguium: Secondary | ICD-10-CM | POA: Diagnosis not present

## 2023-12-22 DIAGNOSIS — E1142 Type 2 diabetes mellitus with diabetic polyneuropathy: Secondary | ICD-10-CM | POA: Diagnosis not present

## 2023-12-22 DIAGNOSIS — M79676 Pain in unspecified toe(s): Secondary | ICD-10-CM | POA: Diagnosis not present

## 2023-12-26 DIAGNOSIS — E119 Type 2 diabetes mellitus without complications: Secondary | ICD-10-CM | POA: Diagnosis not present

## 2023-12-26 DIAGNOSIS — I10 Essential (primary) hypertension: Secondary | ICD-10-CM | POA: Diagnosis not present

## 2023-12-30 ENCOUNTER — Other Ambulatory Visit (INDEPENDENT_AMBULATORY_CARE_PROVIDER_SITE_OTHER): Payer: 59

## 2023-12-30 ENCOUNTER — Telehealth: Payer: Self-pay | Admitting: Pharmacist

## 2023-12-30 DIAGNOSIS — I10 Essential (primary) hypertension: Secondary | ICD-10-CM | POA: Diagnosis not present

## 2023-12-30 DIAGNOSIS — E119 Type 2 diabetes mellitus without complications: Secondary | ICD-10-CM | POA: Diagnosis not present

## 2023-12-30 NOTE — Telephone Encounter (Signed)
Unsuccessful outreach for patient with uncontrolled A1c.  VM left encouraging patient return call

## 2023-12-30 NOTE — Progress Notes (Signed)
12/30/2023 Name: Candice Brewer MRN: [id number] DOB: Dec 02, 1931  Chief Complaint  Patient presents with   Diabetes    Candice Brewer is a 89 y.o. year old female who presented for a telephone visit.   They were referred to the pharmacist by their PCP for assistance in managing diabetes.    Subjective:  Care Team: Primary Care Provider: Sonny Masters, FNP   Medication Access/Adherence  Current Pharmacy:  [facility] [place], [place] - [address] [place] [address] Phone: [phone] Fax: [phone]   Patient reports affordability concerns with their medications: No  Patient reports access/transportation concerns to their pharmacy: No  Patient reports adherence concerns with their medications:  No   -- pillbox ; lives with daughter   Diabetes:  Current medications: farxiga Medications tried in the past:  metformin, glipizide, janumet   Current glucose readings: not sure States someone is checking and recording her sugars for her  Patient denies hypoglycemic s/sx including dizziness, shakiness, sweating. Patient denies hyperglycemic symptoms including polyuria, polydipsia, polyphagia, nocturia, neuropathy, blurred vision.  Current meal patterns:  Discussed meal planning options and Plate method for healthy eating Avoid sugary drinks and desserts Incorporate balanced protein, non starchy veggies, 1 serving of carbohydrate with each meal Increase water intake Increase physical activity as able  Current physical activity: as able  Current medication access support: tricare   Objective:  Lab Results  Component Value Date   HGBA1C 8.6 (H) 10/21/2023    Lab Results  Component Value Date   CREATININE 1.60 (H) 10/21/2023   BUN 40 (H) 10/21/2023   NA 134 10/21/2023   K 4.3 10/21/2023   CL 96 10/21/2023   CO2 23 10/21/2023    Lab Results  Component Value Date   CHOL 206 (H) 10/21/2023   HDL 99 10/21/2023    LDLCALC 82 10/21/2023   TRIG 151 (H) 10/21/2023   CHOLHDL 2.1 10/21/2023    Medications Reviewed Today     Reviewed by Danella Maiers, [facility] (Pharmacist) on 12/30/23 at 1524  Med List Status: <None>   Medication Order Taking? Sig Documenting Provider Last Dose Status Informant  aspirin EC 81 MG tablet [id number] No Take 81 mg by mouth daily at 2 PM. [provider] Taking Active   brimonidine-timolol (COMBIGAN) 0.2-0.5 % ophthalmic solution [id number] No Place 1 drop into both eyes 2 (two) times daily.  [provider] Taking Active Self  cholecalciferol (VITAMIN D) 1000 units tablet [id number] No Take 1,000 Units by mouth daily. [provider] Taking Active Self  dapagliflozin propanediol (FARXIGA) 10 MG TABS tablet [id number] No Take 1 tablet (10 mg total) by mouth daily before breakfast. Rakes, Doralee Albino, FNP Taking Active   enalapril (VASOTEC) 10 MG tablet [id number] No Take 1 tablet (10 mg total) by mouth 2 (two) times daily. Sonny Masters, FNP Taking Expired 10/21/23 2359   hydrochlorothiazide (HYDRODIURIL) 25 MG tablet [id number]  TAKE ONE TABLET EVERY DAY Rakes, Doralee Albino, FNP  Active   latanoprost (XALATAN) 0.005 % ophthalmic solution [id number] No Place 1 drop into both eyes at bedtime.  [provider] Taking Active Self           Med Note Abbe Amsterdam, HEATHER L   Fri Mar 07, 2015 12:10 PM)    Multiple Vitamin (MULTIVITAMIN WITH MINERALS) TABS tablet [id number] No Take 1 tablet by mouth daily. [provider] Taking Active   valACYclovir (VALTREX) 1000  MG tablet [id number] No Take 1,000 mg by mouth 2 (two) times daily. [provider] Taking Active               Assessment/Plan:   Diabetes: - Currently uncontrolled--reports no side effects to farxiga  No aware of Bgs as someone is checking & recording for her; will touch base with patient at PCP visit in Feb; hoping better controlled at that time; Farxiga monotherapy-continue  -  Reviewed long term cardiovascular and renal outcomes of uncontrolled blood sugar - Reviewed goal A1c, goal fasting, and goal 2 hour post prandial glucose - Recommend to continue [place]  - Patient denies personal or family history of multiple endocrine neoplasia type 2, medullary thyroid cancer; personal history of pancreatitis or gallbladder disease. - Recommend to check glucose daily (fasting) or if symptomatic   Follow Up Plan: PCP 01/25/24  Kieth Brightly, PharmD, BCACP, CPP Clinical Pharmacist, [facility]

## 2024-01-25 ENCOUNTER — Telehealth: Payer: Self-pay

## 2024-01-25 ENCOUNTER — Ambulatory Visit (INDEPENDENT_AMBULATORY_CARE_PROVIDER_SITE_OTHER): Payer: 59 | Admitting: Family Medicine

## 2024-01-25 ENCOUNTER — Encounter: Payer: Self-pay | Admitting: Family Medicine

## 2024-01-25 VITALS — BP 139/76 | HR 71 | Temp 97.0°F | Ht 66.0 in | Wt 135.8 lb

## 2024-01-25 DIAGNOSIS — I10 Essential (primary) hypertension: Secondary | ICD-10-CM | POA: Diagnosis not present

## 2024-01-25 DIAGNOSIS — I152 Hypertension secondary to endocrine disorders: Secondary | ICD-10-CM | POA: Diagnosis not present

## 2024-01-25 DIAGNOSIS — M25562 Pain in left knee: Secondary | ICD-10-CM

## 2024-01-25 DIAGNOSIS — E785 Hyperlipidemia, unspecified: Secondary | ICD-10-CM

## 2024-01-25 DIAGNOSIS — E1169 Type 2 diabetes mellitus with other specified complication: Secondary | ICD-10-CM

## 2024-01-25 DIAGNOSIS — E1122 Type 2 diabetes mellitus with diabetic chronic kidney disease: Secondary | ICD-10-CM

## 2024-01-25 DIAGNOSIS — N183 Chronic kidney disease, stage 3 unspecified: Secondary | ICD-10-CM | POA: Diagnosis not present

## 2024-01-25 DIAGNOSIS — Z7984 Long term (current) use of oral hypoglycemic drugs: Secondary | ICD-10-CM | POA: Diagnosis not present

## 2024-01-25 DIAGNOSIS — E1159 Type 2 diabetes mellitus with other circulatory complications: Secondary | ICD-10-CM

## 2024-01-25 DIAGNOSIS — G8929 Other chronic pain: Secondary | ICD-10-CM

## 2024-01-25 DIAGNOSIS — M25641 Stiffness of right hand, not elsewhere classified: Secondary | ICD-10-CM

## 2024-01-25 DIAGNOSIS — E119 Type 2 diabetes mellitus without complications: Secondary | ICD-10-CM | POA: Diagnosis not present

## 2024-01-25 DIAGNOSIS — M25552 Pain in left hip: Secondary | ICD-10-CM | POA: Diagnosis not present

## 2024-01-25 LAB — BAYER DCA HB A1C WAIVED: HB A1C (BAYER DCA - WAIVED): 8.2 % — ABNORMAL HIGH (ref 4.8–5.6)

## 2024-01-25 MED ORDER — EMPAGLIFLOZIN 10 MG PO TABS: 10.0000 mg | ORAL_TABLET | Freq: Every day | ORAL | 1 refills | Status: DC

## 2024-01-25 MED ORDER — ENALAPRIL MALEATE 10 MG PO TABS
10.0000 mg | ORAL_TABLET | Freq: Two times a day (BID) | ORAL | 1 refills | Status: DC
Start: 2024-01-25 — End: 2024-04-26

## 2024-01-25 NOTE — Telephone Encounter (Signed)
 Copied from CRM [id number]. Topic: General - Other >> Jan 25, 2024  2:52 PM Antwanette L wrote: Reason for CRM: Informed patient she no longer needs to take dapagliflozin propanediol(FARXIGA) and that her provider prescribed Jardiance instead. Also patient wanted to let Gilford Silvius know that she is currently taking Turmeric Curcumin(dietary supplement) and  glucoil(blood sugar optimizer).

## 2024-01-25 NOTE — Telephone Encounter (Signed)
 Refer to other phone note .

## 2024-01-25 NOTE — Telephone Encounter (Signed)
 Copied from CRM [id number]. Topic: General - Other >> Jan 25, 2024  1:22 PM Eunice Blase wrote: Reason for CRM: Pt called wants to relay to provided that pt stopped taking dapagliflozin propanediol (FARXIGA) 10 MG TABS tablet. Please call pt at [phone].

## 2024-01-25 NOTE — Addendum Note (Signed)
 Addended by: Sonny Masters on: 01/25/2024 01:49 PM   Modules accepted: Orders

## 2024-01-25 NOTE — Progress Notes (Signed)
 Pt has stopped [place], will send in [employer]. Make sure pt does not take both medications. Needs to discard the Marcelline Deist if she is no longer taking this.

## 2024-01-25 NOTE — Patient Instructions (Addendum)

## 2024-01-25 NOTE — Progress Notes (Signed)
 Subjective:  Patient ID: Candice Brewer, female    DOB: 09-29-1932, 89 y.o.   MRN: [id number]  Patient Care Team: Sonny Masters, FNP as PCP - General (Family Medicine)   Chief Complaint:  Diabetes (3 month follow up )   HPI: Candice Brewer is a 89 y.o. female presenting on 01/25/2024 for Diabetes (3 month follow up )   Discussed the use of AI scribe software for clinical note transcription with the patient, who gave verbal consent to proceed.  History of Present Illness   Candice Brewer is an 89 year old female with diabetes who presents for medication management and follow-up. She is accompanied by her daughter.  She has been experiencing confusion regarding her diabetes medications. She stopped [place] due to increased urination and swelling in her feet. Currently, she is taking Glucotrol, identified from the bottle at home. She has a history of using Janumet and metformin, both discontinued due to cost and side effects, respectively. Her A1c is 8.2%. No increased thirst, hunger, or urination beyond usual.  She continues to take enalapril (Vasotec) twice daily and a diuretic once daily for blood pressure management. She is unsure about one of her medications and plans to verify this at home.  She uses a cane for ambulation, especially outside, and has been cautious about her mobility due to a history of a fall. She has been undergoing acupuncture since December, which has been beneficial for her leg, hip, and hand pain. She attends sessions every Friday and has noticed improvements in pain and numbness. She has also resumed some physical activity, attending the lab on Tuesdays and Thursdays.  She lives in [place] and is supported by her family, who are actively involved in her healthcare management.          Relevant past medical, surgical, family, and social history reviewed and updated as indicated.  Allergies and medications reviewed and updated. Data reviewed: Chart  in Epic.   Past Medical History:  Diagnosis Date   Arthritis    Chest pain, unspecified    Constipation    DM (diabetes mellitus) (HCC)    Dry eyes    Esophageal stricture 09/2011   schatzki's ring   Fibroids    uterus   Glaucoma    Helicobacter pylori gastritis 2012   Treated   HTN (hypertension)    Hyperlipidemia    Restless legs syndrome (RLS)    Vitamin D deficiency     Past Surgical History:  Procedure Laterality Date   APPENDECTOMY     BREAST BIOPSY Left 03/20/2018   Procedure: BREAST BIOPSY WITH NEEDLE LOCALIZATION;  Surgeon: Lucretia Roers, MD;  Location: [facility];  Service: General;  Laterality: Left;   BREAST EXCISIONAL BIOPSY Left 2019   Atypical hyperplasia removed   CARDIOVASCULAR STRESS TEST  2009   CATARACT EXTRACTION  2010   right eye   CATARACT EXTRACTION  2012   left eye   COLONOSCOPY  08/2006   Dr. Elmer Ramp   COLONOSCOPY  10/13/2011   Normal. Next TCS 09/2016.   Procedure: COLONOSCOPY;  Surgeon: Corbin Ade, MD;  Location: [facility];  Service: Endoscopy;  Laterality: N/A;  8:15   COLONOSCOPY N/A 03/10/2015   RMR: External and anal canal hemorrhoids likely source of hematochezia. Redundant colon. Single sigmoid polyp removed ad described above.    ESOPHAGOGASTRODUODENOSCOPY  06/2008   Dr. Sinda Du ring s/p disruption, erosive reflux esophagitis, hh.    ESOPHAGOGASTRODUODENOSCOPY  09/2011   Schatki's ring s/p dilation, multiple 1-52mm antral and bulbar erosions, bx positive for H.Pylori s/ treatment   TUBAL LIGATION      Social History   Socioeconomic History   Marital status: Divorced    Spouse name: Not on file   Number of children: 5   Years of education: Not on file   Highest education level: Associate degree: occupational, Scientist, product/process development, or vocational program  Occupational History   Occupation: retired Psychologist, occupational  Tobacco Use   Smoking status: Never   Smokeless tobacco: Never   Tobacco comments:    quit about 35+ yrs   Vaping Use   Vaping status: Never Used  Substance and Sexual Activity   Alcohol use: No    Alcohol/week: 0.0 standard drinks of alcohol   Drug use: No   Sexual activity: Not Currently  Other Topics Concern   Not on file  Social History Narrative   Not on file   Social Drivers of Health   Financial Resource Strain: Patient Declined (01/23/2024)   Overall Financial Resource Strain (CARDIA)    Difficulty of Paying Living Expenses: Patient declined  Food Insecurity: Patient Declined (01/23/2024)   Hunger Vital Sign    Worried About Running Out of Food in the Last Year: Patient declined    Ran Out of Food in the Last Year: Patient declined  Transportation Needs: No Transportation Needs (01/23/2024)   PRAPARE - Administrator, Civil Service (Medical): No    Lack of Transportation (Non-Medical): No  Physical Activity: Unknown (01/23/2024)   Exercise Vital Sign    Days of Exercise per Week: Patient declined    Minutes of Exercise per Session: 60 min  Stress: No Stress Concern Present (01/23/2024)   [employer] of Occupational Health - Occupational Stress Questionnaire    Feeling of Stress : Not at all  Social Connections: Moderately Integrated (01/23/2024)   Social Connection and Isolation Panel [NHANES]    Frequency of Communication with Friends and Family: More than three times a week    Frequency of Social Gatherings with Friends and Family: More than three times a week    Attends Religious Services: More than 4 times per year    Active Member of [employer] or Organizations: Yes    Attends Engineer, structural: More than 4 times per year    Marital Status: Divorced  Intimate Partner Violence: Not At Risk (02/15/2023)   Humiliation, Afraid, Rape, and Kick questionnaire    Fear of Current or Ex-Partner: No    Emotionally Abused: No    Physically Abused: No    Sexually Abused: No    Outpatient Encounter Medications as of 01/25/2024  Medication Sig   aspirin EC  81 MG tablet Take 81 mg by mouth daily at 2 PM.   brimonidine-timolol (COMBIGAN) 0.2-0.5 % ophthalmic solution Place 1 drop into both eyes 2 (two) times daily.    cholecalciferol (VITAMIN D) 1000 units tablet Take 1,000 Units by mouth daily.   dapagliflozin propanediol (FARXIGA) 10 MG TABS tablet Take 1 tablet (10 mg total) by mouth daily before breakfast.   enalapril (VASOTEC) 10 MG tablet Take 1 tablet (10 mg total) by mouth 2 (two) times daily.   hydrochlorothiazide (HYDRODIURIL) 25 MG tablet TAKE ONE TABLET EVERY DAY   latanoprost (XALATAN) 0.005 % ophthalmic solution Place 1 drop into both eyes at bedtime.    Multiple Vitamin (MULTIVITAMIN WITH MINERALS) TABS tablet Take 1 tablet by mouth daily.   valACYclovir (VALTREX)  1000 MG tablet Take 1,000 mg by mouth 2 (two) times daily.   [DISCONTINUED] enalapril (VASOTEC) 10 MG tablet Take 1 tablet (10 mg total) by mouth 2 (two) times daily.   No facility-administered encounter medications on file as of 01/25/2024.    Allergies  Allergen Reactions   Benzalkonium Chloride Other (See Comments)    Other Reaction(s): Unknown   Benzalkonium Other (See Comments)   Metformin And Related     Contraindicated due to chronic kidney disease GFR 30s   Neomycin-Bacitracin Zn-Polymyx     REACTION: "Severe skin rash"   Pravastatin Sodium     REACTION: "Funny feeling and leg pain"   Neomy-Bacit-Polymyx-Pramoxine Rash   Rosuvastatin Rash    Pertinent ROS per HPI, otherwise unremarkable      Objective:  BP 139/76   Pulse 71   Temp (!) 97 F (36.1 C)   Ht 5\' 6"  (1.676 m)   Wt 135 lb 12.8 oz (61.6 kg)   SpO2 99%   BMI 21.92 kg/m    Wt Readings from Last 3 Encounters:  01/25/24 135 lb 12.8 oz (61.6 kg)  10/21/23 141 lb 6 oz (64.1 kg)  07/21/23 144 lb 6.4 oz (65.5 kg)    Physical Exam Vitals and nursing note reviewed.  Constitutional:      General: She is not in acute distress.    Appearance: Normal appearance. She is not ill-appearing,  toxic-appearing or diaphoretic.  HENT:     Head: Normocephalic and atraumatic.     Nose: Nose normal.     Mouth/Throat:     Mouth: Mucous membranes are moist.  Eyes:     Conjunctiva/sclera: Conjunctivae normal.     Pupils: Pupils are equal, round, and reactive to light.  Cardiovascular:     Rate and Rhythm: Normal rate and regular rhythm.     Heart sounds: Normal heart sounds.  Pulmonary:     Effort: Pulmonary effort is normal.     Breath sounds: Normal breath sounds.  Skin:    General: Skin is warm and dry.     Capillary Refill: Capillary refill takes less than 2 seconds.  Neurological:     General: No focal deficit present.     Mental Status: She is alert and oriented to person, place, and time.     Gait: Gait abnormal (antalgic, using walking stick).  Psychiatric:        Mood and Affect: Mood normal.        Behavior: Behavior normal.        Thought Content: Thought content normal.        Judgment: Judgment normal.     Results for orders placed or performed in visit on 10/21/23  Bayer DCA Hb A1c Waived   Collection Time: 10/21/23 11:45 AM  Result Value Ref Range   HB A1C (BAYER DCA - WAIVED) 8.6 (H) 4.8 - 5.6 %  CBC with Differential/Platelet   Collection Time: 10/21/23 11:46 AM  Result Value Ref Range   WBC 8.3 3.4 - 10.8 x10E3/uL   RBC 3.67 (L) 3.77 - 5.28 x10E6/uL   Hemoglobin 12.9 11.1 - 15.9 g/dL   Hematocrit 29.5 28.4 - 46.6 %   MCV 106 (H) 79 - 97 fL   MCH 35.1 (H) 26.6 - 33.0 pg   MCHC 33.2 31.5 - 35.7 g/dL   RDW 13.2 44.0 - 10.2 %   Platelets 248 150 - 450 x10E3/uL   Neutrophils 73 Not Estab. %   Lymphs 21 Not Estab. %  Monocytes 6 Not Estab. %   Eos 0 Not Estab. %   Basos 0 Not Estab. %   Neutrophils Absolute 5.9 1.4 - 7.0 x10E3/uL   Lymphocytes Absolute 1.7 0.7 - 3.1 x10E3/uL   Monocytes Absolute 0.5 0.1 - 0.9 x10E3/uL   EOS (ABSOLUTE) 0.0 0.0 - 0.4 x10E3/uL   Basophils Absolute 0.0 0.0 - 0.2 x10E3/uL   Immature Granulocytes 0 Not Estab. %    Immature Grans (Abs) 0.0 0.0 - 0.1 x10E3/uL  CMP14+EGFR   Collection Time: 10/21/23 11:46 AM  Result Value Ref Range   Glucose 224 (H) 70 - 99 mg/dL   BUN 40 (H) 8 - 27 mg/dL   Creatinine, Ser 1.61 (H) 0.57 - 1.00 mg/dL   eGFR 31 (L) >09 UE/AVW/0.98   BUN/Creatinine Ratio 25 12 - 28   Sodium 134 134 - 144 mmol/L   Potassium 4.3 3.5 - 5.2 mmol/L   Chloride 96 96 - 106 mmol/L   CO2 23 20 - 29 mmol/L   Calcium 9.8 8.7 - 10.3 mg/dL   Total Protein 7.1 6.0 - 8.5 g/dL   Albumin 4.3 3.7 - 4.7 g/dL   Globulin, Total 2.8 1.5 - 4.5 g/dL   Bilirubin Total 0.3 0.0 - 1.2 mg/dL   Alkaline Phosphatase 107 44 - 121 IU/L   AST 22 0 - 40 IU/L   ALT 19 0 - 32 IU/L  Lipid panel   Collection Time: 10/21/23 11:46 AM  Result Value Ref Range   Cholesterol, Total 206 (H) 100 - 199 mg/dL   Triglycerides 119 (H) 0 - 149 mg/dL   HDL 99 >14 mg/dL   VLDL Cholesterol Cal 25 5 - 40 mg/dL   LDL Chol Calc (NIH) 82 0 - 99 mg/dL   Chol/HDL Ratio 2.1 0.0 - 4.4 ratio  Thyroid Panel With TSH   Collection Time: 10/21/23 11:46 AM  Result Value Ref Range   TSH 1.150 0.450 - 4.500 uIU/mL   T4, Total 4.2 (L) 4.5 - 12.0 ug/dL   T3 Uptake Ratio 34 24 - 39 %   Free Thyroxine Index 1.4 1.2 - 4.9  Vitamin B12   Collection Time: 10/21/23 11:46 AM  Result Value Ref Range   Vitamin B-12 618 232 - 1,245 pg/mL  Specimen status report   Collection Time: 10/21/23 11:46 AM  Result Value Ref Range   specimen status report Comment   Microalbumin / creatinine urine ratio   Collection Time: 10/21/23  2:29 PM  Result Value Ref Range   Creatinine, Urine 29.7 Not Estab. mg/dL   Microalbumin, Urine 78.2 Not Estab. ug/mL   Microalb/Creat Ratio 41 (H) 0 - 29 mg/g creat       Pertinent labs & imaging results that were available during my care of the patient were reviewed by me and considered in my medical decision making.  Assessment & Plan:  Candice Brewer was seen today for diabetes.  Diagnoses and all orders for this  visit:  Type 2 diabetes mellitus with other specified complication, without long-term current use of insulin (HCC) -     CBC with Differential/Platelet -     CMP14+EGFR -     Bayer DCA Hb A1c Waived -     enalapril (VASOTEC) 10 MG tablet; Take 1 tablet (10 mg total) by mouth 2 (two) times daily.  Hypertension associated with diabetes (HCC) -     CBC with Differential/Platelet -     CMP14+EGFR -     Bayer DCA Hb A1c Waived -  enalapril (VASOTEC) 10 MG tablet; Take 1 tablet (10 mg total) by mouth 2 (two) times daily.  Hyperlipidemia associated with type 2 diabetes mellitus (HCC) -     CMP14+EGFR -     Bayer DCA Hb A1c Waived  CKD stage 3 due to type 2 diabetes mellitus (HCC) -     CBC with Differential/Platelet -     CMP14+EGFR  Pain in left hip Chronic pain of left knee Hand joint stiff, right Acupuncture has been beneficial. Starting to go back to the gym.    Assessment and Plan    Type 2 Diabetes Mellitus Type 2 Diabetes Mellitus with an A1c of 8.2. Recently stopped [place] due to increased urination and foot swelling. Currently on Glucotrol. Goal is to maintain A1c below 8 to avoid hypoglycemia and associated risks such as falls. Discussed renal protection due to elevated creatinine (1.6). Explained that SGLT2 inhibitors like Farxiga and Jardiance increase glucose excretion via urine. Discussed alternative medications including Januvia, Janumet, and Jardiance. - Verify current diabetes medications at home - Call pharmacy to confirm medication list - Consider starting Jardiance if Marcelline Deist is not tolerated - Monitor blood glucose levels and renal function  Hypertension On enalapril (Vasotec) twice daily and a diuretic once daily. Blood pressure management is crucial to prevent complications. - Continue enalapril (Vasotec) twice daily - Continue diuretic once daily - Monitor blood pressure regularly  Chronic Pain and Neuropathy Chronic pain and neuropathy, particularly  in the leg and hand. Acupuncture since December has provided some relief. Uses a cane for ambulation and has been advised to use it cautiously to prevent falls. - Continue acupuncture sessions every Friday - Encourage safe use of cane, especially outside the home - Monitor pain and neuropathy symptoms  Dry Eyes Uses Systane for dry eyes, which has been effective. Has a coupon for the medication. - Continue using Systane as needed for dry eyes  General Health Maintenance Annual wellness visit including a mental exam was passed. Generally doing well for her age. - Schedule next annual wellness visit  Follow-up - Update medication list and confirm with pharmacy - Call after verifying medications at home - Schedule follow-up appointment in 3 months.      Total time spent with patient 40 minutes.      Continue all other maintenance medications.  Follow up plan: Return in about 3 months (around 04/23/2024), or if symptoms worsen or fail to improve, for chronic follow up, DM.   Continue healthy lifestyle choices, including diet (rich in fruits, vegetables, and lean proteins, and low in salt and simple carbohydrates) and exercise (at least 30 minutes of moderate physical activity daily).  Educational handout given for DM  The above assessment and management plan was discussed with the patient. The patient verbalized understanding of and has agreed to the management plan. Patient is aware to call the clinic if they develop any new symptoms or if symptoms persist or worsen. Patient is aware when to return to the clinic for a follow-up visit. Patient educated on when it is appropriate to go to the emergency department.   Kari Baars, FNP-C [facility] [phone]

## 2024-01-26 LAB — CMP14+EGFR
ALT: 18 IU/L (ref 0–32)
AST: 20 IU/L (ref 0–40)
Albumin: 4.1 g/dL (ref 3.7–4.7)
Alkaline Phosphatase: 110 IU/L (ref 44–121)
BUN/Creatinine Ratio: 23 (ref 12–28)
BUN: 30 mg/dL — ABNORMAL HIGH (ref 8–27)
Bilirubin Total: 0.5 mg/dL (ref 0.0–1.2)
CO2: 22 mmol/L (ref 20–29)
Calcium: 10.4 mg/dL — ABNORMAL HIGH (ref 8.7–10.3)
Chloride: 104 mmol/L (ref 96–106)
Creatinine, Ser: 1.28 mg/dL — ABNORMAL HIGH (ref 0.57–1.00)
Globulin, Total: 2.8 g/dL (ref 1.5–4.5)
Glucose: 219 mg/dL — ABNORMAL HIGH (ref 70–99)
Potassium: 4.4 mmol/L (ref 3.5–5.2)
Sodium: 141 mmol/L (ref 134–144)
Total Protein: 6.9 g/dL (ref 6.0–8.5)
eGFR: 40 mL/min/{1.73_m2} — ABNORMAL LOW (ref >59)

## 2024-01-26 LAB — CBC WITH DIFFERENTIAL/PLATELET
Basophils Absolute: 0.1 10*3/uL (ref 0.0–0.2)
Basos: 1 %
EOS (ABSOLUTE): 0.1 10*3/uL (ref 0.0–0.4)
Eos: 1 %
Hematocrit: 35.6 % (ref 34.0–46.6)
Hemoglobin: 11.7 g/dL (ref 11.1–15.9)
Immature Grans (Abs): 0 10*3/uL (ref 0.0–0.1)
Immature Granulocytes: 0 %
Lymphocytes Absolute: 1.5 10*3/uL (ref 0.7–3.1)
Lymphs: 18 %
MCH: 34 pg — ABNORMAL HIGH (ref 26.6–33.0)
MCHC: 32.9 g/dL (ref 31.5–35.7)
MCV: 104 fL — ABNORMAL HIGH (ref 79–97)
Monocytes Absolute: 0.5 10*3/uL (ref 0.1–0.9)
Monocytes: 6 %
Neutrophils Absolute: 6.4 10*3/uL (ref 1.4–7.0)
Neutrophils: 74 %
Platelets: 233 10*3/uL (ref 150–450)
RBC: 3.44 x10E6/uL — ABNORMAL LOW (ref 3.77–5.28)
RDW: 11.8 % (ref 11.7–15.4)
WBC: 8.5 10*3/uL (ref 3.4–10.8)

## 2024-01-27 DIAGNOSIS — I10 Essential (primary) hypertension: Secondary | ICD-10-CM | POA: Diagnosis not present

## 2024-01-27 DIAGNOSIS — E119 Type 2 diabetes mellitus without complications: Secondary | ICD-10-CM | POA: Diagnosis not present

## 2024-02-16 ENCOUNTER — Ambulatory Visit: Payer: 59

## 2024-02-16 ENCOUNTER — Ambulatory Visit: Payer: Self-pay

## 2024-02-16 VITALS — BP 151/82 | HR 71 | Ht 66.0 in | Wt 130.0 lb

## 2024-02-16 DIAGNOSIS — Z0000 Encounter for general adult medical examination without abnormal findings: Secondary | ICD-10-CM | POA: Diagnosis not present

## 2024-02-16 NOTE — Patient Instructions (Signed)
 Candice Brewer , Thank you for taking time to come for your Medicare Wellness Visit. I appreciate your ongoing commitment to your health goals. Please review the following plan we discussed and let me know if I can assist you in the future.   Referrals/Orders/Follow-Ups/Clinician Recommendations: Keep up the good work with maintaining your health.   This is a list of the screening recommended for you and due dates:  Health Maintenance  Topic Date Due   Eye exam for diabetics  12/22/2023   DTaP/Tdap/Td vaccine (1 - Tdap) 10/19/2024*   COVID-19 Vaccine (6 - Moderna risk 2024-25 season) 03/13/2024   Complete foot exam   04/19/2024   Hemoglobin A1C  07/24/2024   Medicare Annual Wellness Visit  02/15/2025   Pneumonia Vaccine  Completed   Flu Shot  Completed   DEXA scan (bone density measurement)  Completed   Zoster (Shingles) Vaccine  Completed   HPV Vaccine  Aged Out  *Topic was postponed. The date shown is not the original due date.    Advanced directives: (Copy Requested) Please bring a copy of your health care power of attorney and living will to the office to be added to your chart at your convenience. You can mail to [facility] [address] [place], [place] [address] or email to [email or web address]  Next Medicare Annual Wellness Visit scheduled for next year: Yes

## 2024-02-16 NOTE — Progress Notes (Signed)
 Subjective:   Candice Brewer is a 89 y.o. who presents for a Medicare Wellness preventive visit.  Visit Complete: Virtual I connected with  Candice Brewer on 02/16/24 by a audio enabled telemedicine application and verified that I am speaking with the correct person using two identifiers.  Patient Location: Home  Provider Location: Home Office  I discussed the limitations of evaluation and management by telemedicine. The patient expressed understanding and agreed to proceed.  Vital Signs: Because this visit was a virtual/telehealth visit, some criteria may be missing or patient reported. Any vitals not documented were not able to be obtained and vitals that have been documented are patient reported.  VideoDeclined- This patient declined Librarian, academic. Therefore the visit was completed with audio only.  Persons Participating in Visit: Patient.  AWV Questionnaire: No: Patient Medicare AWV questionnaire was not completed prior to this visit.  Cardiac Risk Factors include: advanced age (>78men, >15 women);diabetes mellitus;dyslipidemia;hypertension     Objective:    Today's Vitals   02/16/24 0838 02/16/24 0854  BP: (!) 151/82   Weight: 130 lb (59 kg)   Height: 5\' 6"  (1.676 m)   PainSc:  9    Body mass index is 20.98 kg/m.     02/16/2024    8:47 AM 08/08/2023   12:51 PM 02/15/2023    8:44 AM 05/07/2020    6:26 PM 03/14/2018    2:45 PM 02/28/2016    3:36 PM 02/27/2016    9:53 AM  Advanced Directives  Does Patient Have a Medical Advance Directive? Yes Yes Yes Yes No Yes Yes  Type of Estate agent of [place];Living will  Healthcare Power of [place];Living will Living will;Healthcare Power of [employer] Power of [employer] Power of Attorney  Does patient want to make changes to medical advance directive?      No - Patient declined No - Patient declined  Copy of Healthcare Power of Attorney in Chart? No - copy  requested  No - copy requested   Yes Yes  Would patient like information on creating a medical advance directive?     No - Patient declined No - patient declined information No - patient declined information    Current Medications (verified) Outpatient Encounter Medications as of 02/16/2024  Medication Sig   aspirin EC 81 MG tablet Take 81 mg by mouth daily at 2 PM.   brimonidine-timolol (COMBIGAN) 0.2-0.5 % ophthalmic solution Place 1 drop into both eyes 2 (two) times daily.    cholecalciferol (VITAMIN D) 1000 units tablet Take 1,000 Units by mouth daily.   empagliflozin (JARDIANCE) 10 MG TABS tablet Take 1 tablet (10 mg total) by mouth daily.   enalapril (VASOTEC) 10 MG tablet Take 1 tablet (10 mg total) by mouth 2 (two) times daily.   hydrochlorothiazide (HYDRODIURIL) 25 MG tablet TAKE ONE TABLET EVERY DAY   latanoprost (XALATAN) 0.005 % ophthalmic solution Place 1 drop into both eyes at bedtime.    Multiple Vitamin (MULTIVITAMIN WITH MINERALS) TABS tablet Take 1 tablet by mouth daily.   valACYclovir (VALTREX) 1000 MG tablet Take 1,000 mg by mouth 2 (two) times daily.   No facility-administered encounter medications on file as of 02/16/2024.    Allergies (verified) Benzalkonium chloride, Benzalkonium, Metformin and related, Neomycin-bacitracin zn-polymyx, Pravastatin sodium, Neomy-bacit-polymyx-pramoxine, and Rosuvastatin   History: Past Medical History:  Diagnosis Date   Arthritis    Chest pain, unspecified    Constipation    DM (diabetes mellitus) (  HCC)    Dry eyes    Esophageal stricture 09/2011   schatzki's ring   Fibroids    uterus   Glaucoma    Helicobacter pylori gastritis 2012   Treated   HTN (hypertension)    Hyperlipidemia    Restless legs syndrome (RLS)    Vitamin D deficiency    Past Surgical History:  Procedure Laterality Date   APPENDECTOMY     BREAST BIOPSY Left 03/20/2018   Procedure: BREAST BIOPSY WITH NEEDLE LOCALIZATION;  Surgeon: Lucretia Roers,  MD;  Location: [facility];  Service: General;  Laterality: Left;   BREAST EXCISIONAL BIOPSY Left 2019   Atypical hyperplasia removed   CARDIOVASCULAR STRESS TEST  2009   CATARACT EXTRACTION  2010   right eye   CATARACT EXTRACTION  2012   left eye   COLONOSCOPY  08/2006   Dr. Elmer Ramp   COLONOSCOPY  10/13/2011   Normal. Next TCS 09/2016.   Procedure: COLONOSCOPY;  Surgeon: Corbin Ade, MD;  Location: [facility];  Service: Endoscopy;  Laterality: N/A;  8:15   COLONOSCOPY N/A 03/10/2015   RMR: External and anal canal hemorrhoids likely source of hematochezia. Redundant colon. Single sigmoid polyp removed ad described above.    ESOPHAGOGASTRODUODENOSCOPY  06/2008   Dr. Sinda Du ring s/p disruption, erosive reflux esophagitis, hh.    ESOPHAGOGASTRODUODENOSCOPY  09/2011   Schatki's ring s/p dilation, multiple 1-38mm antral and bulbar erosions, bx positive for H.Pylori s/ treatment   TUBAL LIGATION     Family History  Problem Relation Age of Onset   Prostate cancer Father    Colon cancer Father        >age 5   Cancer Paternal Grandmother        metastatic at time of diagnosis, primary unknown   Cancer Paternal Aunt        metastatic at time of diagnosisi, primary unknown   Social History   Socioeconomic History   Marital status: Divorced    Spouse name: Not on file   Number of children: 5   Years of education: Not on file   Highest education level: Associate degree: occupational, Scientist, product/process development, or vocational program  Occupational History   Occupation: retired Psychologist, occupational  Tobacco Use   Smoking status: Never   Smokeless tobacco: Never   Tobacco comments:    quit about 35+ yrs  Vaping Use   Vaping status: Never Used  Substance and Sexual Activity   Alcohol use: No    Alcohol/week: 0.0 standard drinks of alcohol   Drug use: No   Sexual activity: Not Currently  Other Topics Concern   Not on file  Social History Narrative   Not on file   Social Drivers of Health    Financial Resource Strain: Patient Declined (02/16/2024)   Overall Financial Resource Strain (CARDIA)    Difficulty of Paying Living Expenses: Patient declined  Food Insecurity: Patient Unable To Answer (02/16/2024)   Hunger Vital Sign    Worried About Running Out of Food in the Last Year: Patient unable to answer    Ran Out of Food in the Last Year: Patient unable to answer  Transportation Needs: No Transportation Needs (02/16/2024)   PRAPARE - Administrator, Civil Service (Medical): No    Lack of Transportation (Non-Medical): No  Physical Activity: Sufficiently Active (02/16/2024)   Exercise Vital Sign    Days of Exercise per Week: 7 days    Minutes of Exercise per Session: 60 min  Stress:  No Stress Concern Present (02/16/2024)   [employer] of Occupational Health - Occupational Stress Questionnaire    Feeling of Stress : Not at all  Social Connections: Moderately Integrated (02/16/2024)   Social Connection and Isolation Panel [NHANES]    Frequency of Communication with Friends and Family: More than three times a week    Frequency of Social Gatherings with Friends and Family: More than three times a week    Attends Religious Services: More than 4 times per year    Active Member of [employer] or Organizations: Yes    Attends Engineer, structural: More than 4 times per year    Marital Status: Divorced    Tobacco Counseling Counseling given: Not Answered Tobacco comments: quit about 35+ yrs    Clinical Intake:  Pre-visit preparation completed: Yes  Pain : 0-10 Pain Score: 9  Pain Type: Chronic pain Pain Location: Hip (L-side pain, hip/lower leg) Pain Descriptors / Indicators: Sharp, Jabbing Pain Onset: More than a month ago (chronically since fall 91yrs ago) Pain Frequency: Constant Pain Relieving Factors: pain meds  Pain Relieving Factors: pain meds  BMI - recorded: 20.98 Nutritional Status: BMI of 19-24  Normal Diabetes: Yes CBG done?: No  (164) Did pt. bring in CBG monitor from home?: No  Lab Results  Component Value Date   HGBA1C 8.2 (H) 01/25/2024   HGBA1C 8.6 (H) 10/21/2023   HGBA1C 6.5 (H) 07/21/2023     How often do you need to have someone help you when you read instructions, pamphlets, or other written materials from your doctor or pharmacy?: 1 - Never What is the last grade level you completed in school?: hs  Interpreter Needed?: No  Information entered by :: Smith Mince   Activities of Daily Living     02/16/2024    8:44 AM 02/13/2024   11:50 AM  In your present state of health, do you have any difficulty performing the following activities:  Hearing? 0 0  Vision? 0 0  Difficulty concentrating or making decisions? 0 0  Walking or climbing stairs? 1 1  Dressing or bathing? 0 0  Doing errands, shopping? 0 0  Preparing Food and eating ? N N  Using the Toilet? N N  In the past six months, have you accidently leaked urine? N N  Do you have problems with loss of bowel control? N N  Managing your Medications? N N  Managing your Finances? N N  Housekeeping or managing your Housekeeping? N N    Patient Care Team: Sonny Masters, FNP as PCP - General (Family Medicine)  Indicate any recent Medical Services you may have received from other than [facility] providers in the past year (date may be approximate).     Assessment:   This is a routine wellness examination for Candice Brewer.  Hearing/Vision screen Hearing Screening - Comments:: Per pt denied hearing def Vision Screening - Comments:: Eyes are update will be having laser eye surgery next wk 3/27 Go to [facility]   Goals Addressed             This Visit's Progress    COMPLETED: DIET - INCREASE WATER INTAKE       Getting excersis       Depression Screen     02/16/2024    8:42 AM 01/25/2024   12:00 PM 07/21/2023   11:17 AM 02/15/2023    8:42 AM 01/19/2023   11:28 AM 10/15/2022   12:32 PM 07/13/2022    2:51  PM  PHQ 2/9 Scores  PHQ - 2  Score 0 0 0 0 0 0 0  PHQ- 9 Score 0 0 0 0 0 0     Fall Risk     02/16/2024    8:42 AM 02/13/2024   11:50 AM 01/25/2024   12:00 PM 10/21/2023   11:43 AM 07/21/2023   11:17 AM  Fall Risk   Falls in the past year? 1 0 1 1 1   Number falls in past yr: 1  0 1 1  Injury with Fall? 1  1 1 1   Risk for fall due to : History of fall(s);Impaired balance/gait;Orthopedic patient  History of fall(s) History of fall(s);Impaired balance/gait;Impaired mobility   Follow up Falls prevention discussed;Falls evaluation completed  Falls evaluation completed Falls evaluation completed Falls prevention discussed    MEDICARE RISK AT HOME:  Medicare Risk at Home Any stairs in or around the home?: Yes If so, are there any without handrails?: Yes Home free of loose throw rugs in walkways, pet beds, electrical cords, etc?: Yes Adequate lighting in your home to reduce risk of falls?: Yes Life alert?: Yes Use of a cane, walker or w/c?: Yes Grab bars in the bathroom?: Yes Shower chair or bench in shower?: Yes Elevated toilet seat or a handicapped toilet?: Yes  TIMED UP AND GO:  Was the test performed?  No  Cognitive Function: 6CIT completed        02/15/2023    8:44 AM  6CIT Screen  What Year? 0 points  What month? 0 points  What time? 0 points  Count back from 20 0 points  Months in reverse 0 points  Repeat phrase 0 points  Total Score 0 points    Immunizations Immunization History  Administered Date(s) Administered   Fluad Quad(high Dose 65+) 08/27/2022   Fluad Trivalent(High Dose 65+) 08/31/2023   Influenza Split 09/20/2007   Influenza, High Dose Seasonal PF 09/10/2020   Influenza,inj,Quad PF,6+ Mos 09/06/2018, 09/01/2021   Influenza-Unspecified 08/01/2017   Moderna Covid-19 Fall Seasonal Vaccine 23yrs & older 10/19/2022, 09/13/2023   Moderna SARS-COV2 Booster Vaccination 01/29/2020, 04/20/2022   Moderna Sars-Covid-2 Vaccination 01/29/2020, 02/26/2020, 09/24/2020   PNEUMOCOCCAL  CONJUGATE-20 08/27/2022   Pneumococcal Polysaccharide-23 07/28/2006   Zoster Recombinant(Shingrix) 08/12/2021, 08/12/2021, 01/22/2022, 01/22/2022   Zoster, Live 11/09/2007    Screening Tests Health Maintenance  Topic Date Due   OPHTHALMOLOGY EXAM  12/22/2023   DTaP/Tdap/Td (1 - Tdap) 10/19/2024 (Originally 04/18/1951)   COVID-19 Vaccine (6 - Moderna risk 2024-25 season) 03/13/2024   FOOT EXAM  04/19/2024   HEMOGLOBIN A1C  07/24/2024   Medicare Annual Wellness (AWV)  02/15/2025   Pneumonia Vaccine 42+ Years old  Completed   INFLUENZA VACCINE  Completed   DEXA SCAN  Completed   Zoster Vaccines- Shingrix  Completed   HPV VACCINES  Aged Out    Health Maintenance  Health Maintenance Due  Topic Date Due   OPHTHALMOLOGY EXAM  12/22/2023   Health Maintenance Items Addressed: See Nurse Notes  Additional Screening:  Vision Screening: Recommended annual ophthalmology exams for early detection of glaucoma and other disorders of the eye.  Dental Screening: Recommended annual dental exams for proper oral hygiene  Community Resource Referral / Chronic Care Management: CRR required this visit?  No   CCM required this visit?  No     Plan:     I have personally reviewed and noted the following in the patient's chart:   Medical and social history Use of alcohol, tobacco  or illicit drugs  Current medications and supplements including opioid prescriptions. Patient is not currently taking opioid prescriptions. Functional ability and status Nutritional status Physical activity Advanced directives List of other physicians Hospitalizations, surgeries, and ER visits in previous 12 months Vitals Screenings to include cognitive, depression, and falls Referrals and appointments  In addition, I have reviewed and discussed with patient certain preventive protocols, quality metrics, and best practice recommendations. A written personalized care plan for preventive services as well as  general preventive health recommendations were provided to patient.     Arta Silence, CMA   02/16/2024   After Visit Summary: (MyChart) Due to this being a telephonic visit, the after visit summary with patients personalized plan was offered to patient via MyChart   Notes: Diabetic Eye is due however machine is not working at the moment, unable to schedule.

## 2024-02-16 NOTE — Telephone Encounter (Signed)
  Chief Complaint: R hand swelling Symptoms: swelling, tightness Frequency: Maybe one month or greater Pertinent Negatives: Patient denies fever, redness, wound Disposition: [] ED /[] Urgent Care (no appt availability in office) / [] Appointment(In office/virtual)/ []  [facility]/ [] Home Care/ [] Refused Recommended Disposition /[] [facility]/ []  Follow-up with PCP Additional Notes: Patient calls reporting R hand swelling and decreased sensation for one month or greater. States that at times there is tingling, states she feels it is caused by the recent change in medications to jardiance. States she has noticed her grip strength has weakened, but reports this is chronic, just worsening. Per protocol, patient to be evaluated within 24 hours. First available appointment with PCP 02/17/24 at 1505.  Care advice reviewed, patient verbalized understanding and denies further questions at this time. Alerting PCP for review.    Copied from CRM [id number]. Topic: Clinical - Red Word Triage >> Feb 16, 2024  3:11 PM Ivette P wrote: [facility] that prompted transfer to Nurse Triage: Numb and swollen and not being able to grip anything. Pt was giving new medication. Has a hard time grabbing cane. Reason for Disposition  MODERATE hand swelling (e.g., visible swelling of hand and fingers; pitting edema)  Answer Assessment - Initial Assessment Questions 1. ONSET: "When did the swelling start?" (e.g., minutes, hours, days)     Thinks it was when she began farxiga-- at least January 2025 2. LOCATION: "What part of the hand is swollen?"  "Are both hands swollen or just one hand?"     R hand currently 3. SEVERITY: "How bad is the swelling?" (e.g., localized; mild, moderate, severe)   - BALL OR LUMP: small ball or lump   - LOCALIZED: puffy or swollen area or patch of skin   - JOINT SWELLING: swelling of a joint   - MILD: puffiness or mild swelling of fingers or hand   - MODERATE: fingers and hand  are swollen   - SEVERE: swelling of entire hand and up into forearm     Mild, but reports decreased grip strength- swelling in fingers, reports decreased sensation 4. REDNESS: "Does the swelling look red or infected?"     Denies 5. PAIN: "Is the swelling painful to touch?" If Yes, ask: "How painful is it?"   (Scale 1-10; mild, moderate or severe)     Denies 6. FEVER: "Do you have a fever?" If Yes, ask: "What is it, how was it measured, and when did it start?"      Denies 7. CAUSE: "What do you think is causing the hand swelling?" (e.g., heat, insect bite, pregnancy, recent injury)     Possibly Jardiance 8. MEDICAL HISTORY: "Do you have a history of heart failure, kidney disease, liver failure, or cancer?"     Denies, chart notes CKD 9. RECURRENT SYMPTOM: "Have you had hand swelling before?" If Yes, ask: "When was the last time?" "What happened that time?"     Denies 10. OTHER SYMPTOMS: "Do you have any other symptoms?" (e.g., blurred vision, difficulty breathing, headache)       Denies  Protocols used: Hand Swelling-A-AH

## 2024-02-17 ENCOUNTER — Ambulatory Visit (INDEPENDENT_AMBULATORY_CARE_PROVIDER_SITE_OTHER): Admitting: Family Medicine

## 2024-02-17 ENCOUNTER — Encounter: Payer: Self-pay | Admitting: Family Medicine

## 2024-02-17 ENCOUNTER — Ambulatory Visit (INDEPENDENT_AMBULATORY_CARE_PROVIDER_SITE_OTHER)

## 2024-02-17 VITALS — BP 140/64 | HR 74 | Temp 97.2°F | Ht 66.0 in | Wt 133.6 lb

## 2024-02-17 DIAGNOSIS — M25641 Stiffness of right hand, not elsewhere classified: Secondary | ICD-10-CM

## 2024-02-17 DIAGNOSIS — M25441 Effusion, right hand: Secondary | ICD-10-CM | POA: Diagnosis not present

## 2024-02-17 DIAGNOSIS — N183 Chronic kidney disease, stage 3 unspecified: Secondary | ICD-10-CM

## 2024-02-17 DIAGNOSIS — M79641 Pain in right hand: Secondary | ICD-10-CM | POA: Diagnosis not present

## 2024-02-17 DIAGNOSIS — E1122 Type 2 diabetes mellitus with diabetic chronic kidney disease: Secondary | ICD-10-CM | POA: Diagnosis not present

## 2024-02-17 MED ORDER — DICLOFENAC SODIUM 1 % EX GEL
2.0000 g | Freq: Four times a day (QID) | CUTANEOUS | 0 refills | Status: DC
Start: 2024-02-17 — End: 2024-04-24

## 2024-02-17 NOTE — Progress Notes (Signed)
 Subjective:  Patient ID: Candice Brewer, female    DOB: 08/24/1932, 89 y.o.   MRN: [id number]  Patient Care Team: Sonny Masters, FNP as PCP - General (Family Medicine)   Chief Complaint:  Edema (Right hand swelling x 2 month and has gotten worse )   HPI: Candice Brewer is a 89 y.o. female presenting on 02/17/2024 for Edema (Right hand swelling x 2 month and has gotten worse )   History of Present Illness   Candice Brewer is an 89 year old female who presents with right hand stiffness and pain.  She experiences persistent stiffness and pain in her right hand, describing it as feeling 'swollen' and 'stiff.' This has been ongoing for a while, causing difficulty in performing exercises and gripping objects. Despite the sensation of swelling, there is no visible swelling, but she feels stiffness in the joint. No new injuries to the hand. She has previously used acupuncture and therapy for her symptoms, but these were discontinued due to insurance coverage issues. She currently uses an Amish cream for arthritis on her hand.  She also experiences pain in her leg, particularly in the morning, which she associates with changes in her sleeping position and pillow use. The pain is exacerbated by walking, especially in the sun, and she relies on a cane for support.          Relevant past medical, surgical, family, and social history reviewed and updated as indicated.  Allergies and medications reviewed and updated. Data reviewed: Chart in Epic.   Past Medical History:  Diagnosis Date   Arthritis    Chest pain, unspecified    Constipation    DM (diabetes mellitus) (HCC)    Dry eyes    Esophageal stricture 09/2011   schatzki's ring   Fibroids    uterus   Glaucoma    Helicobacter pylori gastritis 2012   Treated   HTN (hypertension)    Hyperlipidemia    Restless legs syndrome (RLS)    Vitamin D deficiency     Past Surgical History:  Procedure Laterality Date   APPENDECTOMY      BREAST BIOPSY Left 03/20/2018   Procedure: BREAST BIOPSY WITH NEEDLE LOCALIZATION;  Surgeon: Lucretia Roers, MD;  Location: [facility];  Service: General;  Laterality: Left;   BREAST EXCISIONAL BIOPSY Left 2019   Atypical hyperplasia removed   CARDIOVASCULAR STRESS TEST  2009   CATARACT EXTRACTION  2010   right eye   CATARACT EXTRACTION  2012   left eye   COLONOSCOPY  08/2006   Dr. Elmer Ramp   COLONOSCOPY  10/13/2011   Normal. Next TCS 09/2016.   Procedure: COLONOSCOPY;  Surgeon: Corbin Ade, MD;  Location: [facility];  Service: Endoscopy;  Laterality: N/A;  8:15   COLONOSCOPY N/A 03/10/2015   RMR: External and anal canal hemorrhoids likely source of hematochezia. Redundant colon. Single sigmoid polyp removed ad described above.    ESOPHAGOGASTRODUODENOSCOPY  06/2008   Dr. Sinda Du ring s/p disruption, erosive reflux esophagitis, hh.    ESOPHAGOGASTRODUODENOSCOPY  09/2011   Schatki's ring s/p dilation, multiple 1-32mm antral and bulbar erosions, bx positive for H.Pylori s/ treatment   TUBAL LIGATION      Social History   Socioeconomic History   Marital status: Divorced    Spouse name: Not on file   Number of children: 5   Years of education: Not on file   Highest education level: Associate degree: occupational, Scientist, product/process development, or vocational  program  Occupational History   Occupation: retired Psychologist, occupational  Tobacco Use   Smoking status: Never   Smokeless tobacco: Never   Tobacco comments:    quit about 35+ yrs  Vaping Use   Vaping status: Never Used  Substance and Sexual Activity   Alcohol use: No    Alcohol/week: 0.0 standard drinks of alcohol   Drug use: No   Sexual activity: Not Currently  Other Topics Concern   Not on file  Social History Narrative   Not on file   Social Drivers of Health   Financial Resource Strain: Patient Declined (02/16/2024)   Overall Financial Resource Strain (CARDIA)    Difficulty of Paying Living Expenses: Patient declined  Food  Insecurity: Patient Unable To Answer (02/16/2024)   Hunger Vital Sign    Worried About Running Out of Food in the Last Year: Patient unable to answer    Ran Out of Food in the Last Year: Patient unable to answer  Transportation Needs: No Transportation Needs (02/16/2024)   PRAPARE - Administrator, Civil Service (Medical): No    Lack of Transportation (Non-Medical): No  Physical Activity: Sufficiently Active (02/16/2024)   Exercise Vital Sign    Days of Exercise per Week: 7 days    Minutes of Exercise per Session: 60 min  Stress: No Stress Concern Present (02/16/2024)   [employer] of Occupational Health - Occupational Stress Questionnaire    Feeling of Stress : Not at all  Social Connections: Moderately Integrated (02/16/2024)   Social Connection and Isolation Panel [NHANES]    Frequency of Communication with Friends and Family: More than three times a week    Frequency of Social Gatherings with Friends and Family: More than three times a week    Attends Religious Services: More than 4 times per year    Active Member of [employer] or Organizations: Yes    Attends Engineer, structural: More than 4 times per year    Marital Status: Divorced  Intimate Partner Violence: Not At Risk (02/16/2024)   Humiliation, Afraid, Rape, and Kick questionnaire    Fear of Current or Ex-Partner: No    Emotionally Abused: No    Physically Abused: No    Sexually Abused: No    Outpatient Encounter Medications as of 02/17/2024  Medication Sig   aspirin EC 81 MG tablet Take 81 mg by mouth daily at 2 PM.   brimonidine-timolol (COMBIGAN) 0.2-0.5 % ophthalmic solution Place 1 drop into both eyes 2 (two) times daily.    cholecalciferol (VITAMIN D) 1000 units tablet Take 1,000 Units by mouth daily.   diclofenac Sodium (VOLTAREN) 1 % GEL Apply 2 g topically 4 (four) times daily.   empagliflozin (JARDIANCE) 10 MG TABS tablet Take 1 tablet (10 mg total) by mouth daily.   enalapril (VASOTEC) 10 MG  tablet Take 1 tablet (10 mg total) by mouth 2 (two) times daily.   hydrochlorothiazide (HYDRODIURIL) 25 MG tablet TAKE ONE TABLET EVERY DAY   latanoprost (XALATAN) 0.005 % ophthalmic solution Place 1 drop into both eyes at bedtime.    Multiple Vitamin (MULTIVITAMIN WITH MINERALS) TABS tablet Take 1 tablet by mouth daily.   valACYclovir (VALTREX) 1000 MG tablet Take 1,000 mg by mouth 2 (two) times daily.   No facility-administered encounter medications on file as of 02/17/2024.    Allergies  Allergen Reactions   Benzalkonium Chloride Other (See Comments)    Other Reaction(s): Unknown   Benzalkonium Other (See Comments)   Metformin  And Related     Contraindicated due to chronic kidney disease GFR 30s   Neomycin-Bacitracin Zn-Polymyx     REACTION: "Severe skin rash"   Pravastatin Sodium     REACTION: "Funny feeling and leg pain"   Neomy-Bacit-Polymyx-Pramoxine Rash   Rosuvastatin Rash    Pertinent ROS per HPI, otherwise unremarkable      Objective:  BP (!) 140/64   Pulse 74   Temp (!) 97.2 F (36.2 C)   Ht 5\' 6"  (1.676 m)   Wt 133 lb 9.6 oz (60.6 kg)   SpO2 99%   BMI 21.56 kg/m    Wt Readings from Last 3 Encounters:  02/17/24 133 lb 9.6 oz (60.6 kg)  02/16/24 130 lb (59 kg)  01/25/24 135 lb 12.8 oz (61.6 kg)    Physical Exam Vitals and nursing note reviewed.  Constitutional:      General: She is not in acute distress.    Appearance: Normal appearance. She is normal weight. She is not ill-appearing, toxic-appearing or diaphoretic.  HENT:     Head: Normocephalic and atraumatic.     Mouth/Throat:     Mouth: Mucous membranes are moist.  Eyes:     Conjunctiva/sclera: Conjunctivae normal.     Pupils: Pupils are equal, round, and reactive to light.  Cardiovascular:     Rate and Rhythm: Normal rate and regular rhythm.     Heart sounds: Normal heart sounds.  Pulmonary:     Effort: Pulmonary effort is normal.     Breath sounds: Normal breath sounds.  Musculoskeletal:      Right wrist: Normal.     Right hand: No lacerations or bony tenderness. Decreased range of motion. Normal strength. Normal sensation. There is no disruption of two-point discrimination. Normal capillary refill. Normal pulse.     Cervical back: Normal range of motion and neck supple.     Right lower leg: No edema.     Left lower leg: Edema present.     Comments: Right hand: knuckle swelling  Skin:    General: Skin is warm and dry.     Capillary Refill: Capillary refill takes less than 2 seconds.  Neurological:     General: No focal deficit present.     Mental Status: She is alert and oriented to person, place, and time.     Gait: Gait abnormal (using walking stick).  Psychiatric:        Mood and Affect: Mood normal.        Behavior: Behavior normal.        Thought Content: Thought content normal.        Judgment: Judgment normal.      Results for orders placed or performed in visit on 01/25/24  Bayer DCA Hb A1c Waived   Collection Time: 01/25/24 12:05 PM  Result Value Ref Range   HB A1C (BAYER DCA - WAIVED) 8.2 (H) 4.8 - 5.6 %  CBC with Differential/Platelet   Collection Time: 01/25/24 12:12 PM  Result Value Ref Range   WBC 8.5 3.4 - 10.8 x10E3/uL   RBC 3.44 (L) 3.77 - 5.28 x10E6/uL   Hemoglobin 11.7 11.1 - 15.9 g/dL   Hematocrit 95.2 84.1 - 46.6 %   MCV 104 (H) 79 - 97 fL   MCH 34.0 (H) 26.6 - 33.0 pg   MCHC 32.9 31.5 - 35.7 g/dL   RDW 32.4 40.1 - 02.7 %   Platelets 233 150 - 450 x10E3/uL   Neutrophils 74 Not Estab. %  Lymphs 18 Not Estab. %   Monocytes 6 Not Estab. %   Eos 1 Not Estab. %   Basos 1 Not Estab. %   Neutrophils Absolute 6.4 1.4 - 7.0 x10E3/uL   Lymphocytes Absolute 1.5 0.7 - 3.1 x10E3/uL   Monocytes Absolute 0.5 0.1 - 0.9 x10E3/uL   EOS (ABSOLUTE) 0.1 0.0 - 0.4 x10E3/uL   Basophils Absolute 0.1 0.0 - 0.2 x10E3/uL   Immature Granulocytes 0 Not Estab. %   Immature Grans (Abs) 0.0 0.0 - 0.1 x10E3/uL  CMP14+EGFR   Collection Time: 01/25/24 12:12 PM   Result Value Ref Range   Glucose 219 (H) 70 - 99 mg/dL   BUN 30 (H) 8 - 27 mg/dL   Creatinine, Ser 1.61 (H) 0.57 - 1.00 mg/dL   eGFR 40 (L) >09 UE/AVW/0.98   BUN/Creatinine Ratio 23 12 - 28   Sodium 141 134 - 144 mmol/L   Potassium 4.4 3.5 - 5.2 mmol/L   Chloride 104 96 - 106 mmol/L   CO2 22 20 - 29 mmol/L   Calcium 10.4 (H) 8.7 - 10.3 mg/dL   Total Protein 6.9 6.0 - 8.5 g/dL   Albumin 4.1 3.7 - 4.7 g/dL   Globulin, Total 2.8 1.5 - 4.5 g/dL   Bilirubin Total 0.5 0.0 - 1.2 mg/dL   Alkaline Phosphatase 110 44 - 121 IU/L   AST 20 0 - 40 IU/L   ALT 18 0 - 32 IU/L     X-Ray: right hand: Arthritic changes. No acute findings. Preliminary x-ray reading by Kari Baars, FNP-C, [facility].   Pertinent labs & imaging results that were available during my care of the patient were reviewed by me and considered in my medical decision making.  Assessment & Plan:  Christyne was seen today for edema.  Diagnoses and all orders for this visit:  Hand joint stiff, right -     Cancel: DG Hand Complete Right -     diclofenac Sodium (VOLTAREN) 1 % GEL; Apply 2 g topically 4 (four) times daily. -     BMP8+EGFR -     CBC with Differential/Platelet -     Uric acid -     ANA Comprehensive Panel -     DG Hand 2 View Right  Swelling of finger joint of right hand -     BMP8+EGFR -     CBC with Differential/Platelet -     Uric acid -     ANA Comprehensive Panel     Assessment and Plan    Right hand stiffness and pain Chronic stiffness and pain in the right hand, possibly due to arthritis. Differential diagnosis includes arthritis, gout, or rheumatoid arthritis. She has been using Amish cream for arthritis and Tylenol was previously recommended. - Order x-ray of the right hand to assess for structural abnormalities - Prescribe Voltaren gel to be used twice daily on the hand for anti-inflammatory effect - Order arthritis panel blood work to check for gout, rheumatoid arthritis, or other conditions  Leg  pain Chronic leg pain exacerbated by walking, possibly related to posture or previous injuries. She reports using a cane for support.  Pain management Discussion of acupuncture and therapy for pain management, but insurance coverage issues have limited access. She is considering alternative therapies such as chiropractic care.          Continue all other maintenance medications.  Follow up plan: Return if symptoms worsen or fail to improve.   Continue healthy lifestyle choices, including diet (rich  in fruits, vegetables, and lean proteins, and low in salt and simple carbohydrates) and exercise (at least 30 minutes of moderate physical activity daily).  Educational handout given for hand pain  The above assessment and management plan was discussed with the patient. The patient verbalized understanding of and has agreed to the management plan. Patient is aware to call the clinic if they develop any new symptoms or if symptoms persist or worsen. Patient is aware when to return to the clinic for a follow-up visit. Patient educated on when it is appropriate to go to the emergency department.   Kari Baars, FNP-C [facility] [phone]

## 2024-02-18 LAB — CBC WITH DIFFERENTIAL/PLATELET
Basophils Absolute: 0 10*3/uL (ref 0.0–0.2)
Basos: 1 %
EOS (ABSOLUTE): 0.1 10*3/uL (ref 0.0–0.4)
Eos: 1 %
Hematocrit: 36.4 % (ref 34.0–46.6)
Hemoglobin: 12.3 g/dL (ref 11.1–15.9)
Immature Grans (Abs): 0 10*3/uL (ref 0.0–0.1)
Immature Granulocytes: 0 %
Lymphocytes Absolute: 1.8 10*3/uL (ref 0.7–3.1)
Lymphs: 24 %
MCH: 34.4 pg — ABNORMAL HIGH (ref 26.6–33.0)
MCHC: 33.8 g/dL (ref 31.5–35.7)
MCV: 102 fL — ABNORMAL HIGH (ref 79–97)
Monocytes Absolute: 0.4 10*3/uL (ref 0.1–0.9)
Monocytes: 5 %
Neutrophils Absolute: 5.2 10*3/uL (ref 1.4–7.0)
Neutrophils: 69 %
Platelets: 260 10*3/uL (ref 150–450)
RBC: 3.58 x10E6/uL — ABNORMAL LOW (ref 3.77–5.28)
RDW: 11.9 % (ref 11.7–15.4)
WBC: 7.5 10*3/uL (ref 3.4–10.8)

## 2024-02-18 LAB — ANA COMPREHENSIVE PANEL
Anti JO-1: <0.2 AI (ref 0.0–0.9)
Centromere Ab Screen: <0.2 AI (ref 0.0–0.9)
Chromatin Ab SerPl-aCnc: <0.2 AI (ref 0.0–0.9)
ENA RNP Ab: <0.2 AI (ref 0.0–0.9)
ENA SM Ab Ser-aCnc: <0.2 AI (ref 0.0–0.9)
ENA SSA (RO) Ab: <0.2 AI (ref 0.0–0.9)
ENA SSB (LA) Ab: <0.2 AI (ref 0.0–0.9)
Scleroderma (Scl-70) (ENA) Antibody, IgG: <0.2 AI (ref 0.0–0.9)
dsDNA Ab: <1 [IU]/mL (ref 0–9)

## 2024-02-18 LAB — BMP8+EGFR
BUN/Creatinine Ratio: 29 — ABNORMAL HIGH (ref 12–28)
BUN: 41 mg/dL — ABNORMAL HIGH (ref 8–27)
CO2: 24 mmol/L (ref 20–29)
Calcium: 10.4 mg/dL — ABNORMAL HIGH (ref 8.7–10.3)
Chloride: 99 mmol/L (ref 96–106)
Creatinine, Ser: 1.39 mg/dL — ABNORMAL HIGH (ref 0.57–1.00)
Glucose: 253 mg/dL — ABNORMAL HIGH (ref 70–99)
Potassium: 4.3 mmol/L (ref 3.5–5.2)
Sodium: 138 mmol/L (ref 134–144)
eGFR: 36 mL/min/{1.73_m2} — ABNORMAL LOW (ref >59)

## 2024-02-18 LAB — URIC ACID: Uric Acid: 5.8 mg/dL (ref 3.1–7.9)

## 2024-02-20 ENCOUNTER — Encounter: Payer: Self-pay | Admitting: Family Medicine

## 2024-02-21 DIAGNOSIS — M9903 Segmental and somatic dysfunction of lumbar region: Secondary | ICD-10-CM | POA: Diagnosis not present

## 2024-02-21 DIAGNOSIS — M9901 Segmental and somatic dysfunction of cervical region: Secondary | ICD-10-CM | POA: Diagnosis not present

## 2024-02-21 DIAGNOSIS — M62830 Muscle spasm of back: Secondary | ICD-10-CM | POA: Diagnosis not present

## 2024-02-21 DIAGNOSIS — M9902 Segmental and somatic dysfunction of thoracic region: Secondary | ICD-10-CM | POA: Diagnosis not present

## 2024-02-21 NOTE — Addendum Note (Signed)
 Addended by: Sonny Masters on: 02/21/2024 11:30 AM   Modules accepted: Orders

## 2024-02-22 DIAGNOSIS — M9901 Segmental and somatic dysfunction of cervical region: Secondary | ICD-10-CM | POA: Diagnosis not present

## 2024-02-22 DIAGNOSIS — M9903 Segmental and somatic dysfunction of lumbar region: Secondary | ICD-10-CM | POA: Diagnosis not present

## 2024-02-22 DIAGNOSIS — M62830 Muscle spasm of back: Secondary | ICD-10-CM | POA: Diagnosis not present

## 2024-02-22 DIAGNOSIS — M9902 Segmental and somatic dysfunction of thoracic region: Secondary | ICD-10-CM | POA: Diagnosis not present

## 2024-02-23 DIAGNOSIS — M9901 Segmental and somatic dysfunction of cervical region: Secondary | ICD-10-CM | POA: Diagnosis not present

## 2024-02-23 DIAGNOSIS — H401131 Primary open-angle glaucoma, bilateral, mild stage: Secondary | ICD-10-CM | POA: Diagnosis not present

## 2024-02-23 DIAGNOSIS — M9903 Segmental and somatic dysfunction of lumbar region: Secondary | ICD-10-CM | POA: Diagnosis not present

## 2024-02-23 DIAGNOSIS — M62830 Muscle spasm of back: Secondary | ICD-10-CM | POA: Diagnosis not present

## 2024-02-23 DIAGNOSIS — M9902 Segmental and somatic dysfunction of thoracic region: Secondary | ICD-10-CM | POA: Diagnosis not present

## 2024-02-24 DIAGNOSIS — E119 Type 2 diabetes mellitus without complications: Secondary | ICD-10-CM | POA: Diagnosis not present

## 2024-02-24 DIAGNOSIS — I10 Essential (primary) hypertension: Secondary | ICD-10-CM | POA: Diagnosis not present

## 2024-02-27 DIAGNOSIS — M62830 Muscle spasm of back: Secondary | ICD-10-CM | POA: Diagnosis not present

## 2024-02-27 DIAGNOSIS — E119 Type 2 diabetes mellitus without complications: Secondary | ICD-10-CM | POA: Diagnosis not present

## 2024-02-27 DIAGNOSIS — M9901 Segmental and somatic dysfunction of cervical region: Secondary | ICD-10-CM | POA: Diagnosis not present

## 2024-02-27 DIAGNOSIS — M9902 Segmental and somatic dysfunction of thoracic region: Secondary | ICD-10-CM | POA: Diagnosis not present

## 2024-02-27 DIAGNOSIS — M9903 Segmental and somatic dysfunction of lumbar region: Secondary | ICD-10-CM | POA: Diagnosis not present

## 2024-02-27 DIAGNOSIS — I10 Essential (primary) hypertension: Secondary | ICD-10-CM | POA: Diagnosis not present

## 2024-02-28 DIAGNOSIS — M62830 Muscle spasm of back: Secondary | ICD-10-CM | POA: Diagnosis not present

## 2024-02-28 DIAGNOSIS — M9903 Segmental and somatic dysfunction of lumbar region: Secondary | ICD-10-CM | POA: Diagnosis not present

## 2024-02-28 DIAGNOSIS — M9902 Segmental and somatic dysfunction of thoracic region: Secondary | ICD-10-CM | POA: Diagnosis not present

## 2024-02-28 DIAGNOSIS — M9901 Segmental and somatic dysfunction of cervical region: Secondary | ICD-10-CM | POA: Diagnosis not present

## 2024-03-01 DIAGNOSIS — M79674 Pain in right toe(s): Secondary | ICD-10-CM | POA: Diagnosis not present

## 2024-03-01 DIAGNOSIS — M9901 Segmental and somatic dysfunction of cervical region: Secondary | ICD-10-CM | POA: Diagnosis not present

## 2024-03-01 DIAGNOSIS — M79675 Pain in left toe(s): Secondary | ICD-10-CM | POA: Diagnosis not present

## 2024-03-01 DIAGNOSIS — M62830 Muscle spasm of back: Secondary | ICD-10-CM | POA: Diagnosis not present

## 2024-03-01 DIAGNOSIS — M9902 Segmental and somatic dysfunction of thoracic region: Secondary | ICD-10-CM | POA: Diagnosis not present

## 2024-03-01 DIAGNOSIS — M9903 Segmental and somatic dysfunction of lumbar region: Secondary | ICD-10-CM | POA: Diagnosis not present

## 2024-03-01 DIAGNOSIS — B351 Tinea unguium: Secondary | ICD-10-CM | POA: Diagnosis not present

## 2024-03-01 DIAGNOSIS — E1142 Type 2 diabetes mellitus with diabetic polyneuropathy: Secondary | ICD-10-CM | POA: Diagnosis not present

## 2024-03-01 DIAGNOSIS — L84 Corns and callosities: Secondary | ICD-10-CM | POA: Diagnosis not present

## 2024-03-05 DIAGNOSIS — M9903 Segmental and somatic dysfunction of lumbar region: Secondary | ICD-10-CM | POA: Diagnosis not present

## 2024-03-05 DIAGNOSIS — M9902 Segmental and somatic dysfunction of thoracic region: Secondary | ICD-10-CM | POA: Diagnosis not present

## 2024-03-05 DIAGNOSIS — M9901 Segmental and somatic dysfunction of cervical region: Secondary | ICD-10-CM | POA: Diagnosis not present

## 2024-03-05 DIAGNOSIS — M62830 Muscle spasm of back: Secondary | ICD-10-CM | POA: Diagnosis not present

## 2024-03-06 DIAGNOSIS — M9902 Segmental and somatic dysfunction of thoracic region: Secondary | ICD-10-CM | POA: Diagnosis not present

## 2024-03-06 DIAGNOSIS — M9903 Segmental and somatic dysfunction of lumbar region: Secondary | ICD-10-CM | POA: Diagnosis not present

## 2024-03-06 DIAGNOSIS — M9901 Segmental and somatic dysfunction of cervical region: Secondary | ICD-10-CM | POA: Diagnosis not present

## 2024-03-06 DIAGNOSIS — M62830 Muscle spasm of back: Secondary | ICD-10-CM | POA: Diagnosis not present

## 2024-03-07 ENCOUNTER — Other Ambulatory Visit: Payer: Self-pay | Admitting: Family Medicine

## 2024-03-07 DIAGNOSIS — I10 Essential (primary) hypertension: Secondary | ICD-10-CM

## 2024-03-08 DIAGNOSIS — M62830 Muscle spasm of back: Secondary | ICD-10-CM | POA: Diagnosis not present

## 2024-03-08 DIAGNOSIS — M9902 Segmental and somatic dysfunction of thoracic region: Secondary | ICD-10-CM | POA: Diagnosis not present

## 2024-03-08 DIAGNOSIS — M9903 Segmental and somatic dysfunction of lumbar region: Secondary | ICD-10-CM | POA: Diagnosis not present

## 2024-03-08 DIAGNOSIS — M9901 Segmental and somatic dysfunction of cervical region: Secondary | ICD-10-CM | POA: Diagnosis not present

## 2024-03-09 ENCOUNTER — Telehealth: Payer: Self-pay | Admitting: Pharmacist

## 2024-03-09 NOTE — Telephone Encounter (Signed)
 Patient was identified as falling into the [place] Measure - Diabetes.   Patient was: Appointment scheduled with primary care provider in the next 30 days.   Last A1c 8.2%  PharmD to touchbase with patient prior to PCP appt  Kieth Brightly, PharmD, BCACP, CPP Clinical Pharmacist, [facility]

## 2024-03-12 DIAGNOSIS — M9902 Segmental and somatic dysfunction of thoracic region: Secondary | ICD-10-CM | POA: Diagnosis not present

## 2024-03-12 DIAGNOSIS — M62830 Muscle spasm of back: Secondary | ICD-10-CM | POA: Diagnosis not present

## 2024-03-12 DIAGNOSIS — M9903 Segmental and somatic dysfunction of lumbar region: Secondary | ICD-10-CM | POA: Diagnosis not present

## 2024-03-12 DIAGNOSIS — M9901 Segmental and somatic dysfunction of cervical region: Secondary | ICD-10-CM | POA: Diagnosis not present

## 2024-03-13 DIAGNOSIS — M9903 Segmental and somatic dysfunction of lumbar region: Secondary | ICD-10-CM | POA: Diagnosis not present

## 2024-03-13 DIAGNOSIS — M9901 Segmental and somatic dysfunction of cervical region: Secondary | ICD-10-CM | POA: Diagnosis not present

## 2024-03-13 DIAGNOSIS — M62830 Muscle spasm of back: Secondary | ICD-10-CM | POA: Diagnosis not present

## 2024-03-13 DIAGNOSIS — M9902 Segmental and somatic dysfunction of thoracic region: Secondary | ICD-10-CM | POA: Diagnosis not present

## 2024-03-15 DIAGNOSIS — M9902 Segmental and somatic dysfunction of thoracic region: Secondary | ICD-10-CM | POA: Diagnosis not present

## 2024-03-15 DIAGNOSIS — M9903 Segmental and somatic dysfunction of lumbar region: Secondary | ICD-10-CM | POA: Diagnosis not present

## 2024-03-15 DIAGNOSIS — M9901 Segmental and somatic dysfunction of cervical region: Secondary | ICD-10-CM | POA: Diagnosis not present

## 2024-03-15 DIAGNOSIS — M62830 Muscle spasm of back: Secondary | ICD-10-CM | POA: Diagnosis not present

## 2024-03-20 DIAGNOSIS — M62830 Muscle spasm of back: Secondary | ICD-10-CM | POA: Diagnosis not present

## 2024-03-20 DIAGNOSIS — M9903 Segmental and somatic dysfunction of lumbar region: Secondary | ICD-10-CM | POA: Diagnosis not present

## 2024-03-20 DIAGNOSIS — M9901 Segmental and somatic dysfunction of cervical region: Secondary | ICD-10-CM | POA: Diagnosis not present

## 2024-03-20 DIAGNOSIS — M9902 Segmental and somatic dysfunction of thoracic region: Secondary | ICD-10-CM | POA: Diagnosis not present

## 2024-03-21 DIAGNOSIS — M9901 Segmental and somatic dysfunction of cervical region: Secondary | ICD-10-CM | POA: Diagnosis not present

## 2024-03-21 DIAGNOSIS — M9903 Segmental and somatic dysfunction of lumbar region: Secondary | ICD-10-CM | POA: Diagnosis not present

## 2024-03-21 DIAGNOSIS — M62830 Muscle spasm of back: Secondary | ICD-10-CM | POA: Diagnosis not present

## 2024-03-21 DIAGNOSIS — M9902 Segmental and somatic dysfunction of thoracic region: Secondary | ICD-10-CM | POA: Diagnosis not present

## 2024-03-26 DIAGNOSIS — M62830 Muscle spasm of back: Secondary | ICD-10-CM | POA: Diagnosis not present

## 2024-03-26 DIAGNOSIS — M9903 Segmental and somatic dysfunction of lumbar region: Secondary | ICD-10-CM | POA: Diagnosis not present

## 2024-03-26 DIAGNOSIS — M9901 Segmental and somatic dysfunction of cervical region: Secondary | ICD-10-CM | POA: Diagnosis not present

## 2024-03-26 DIAGNOSIS — M9902 Segmental and somatic dysfunction of thoracic region: Secondary | ICD-10-CM | POA: Diagnosis not present

## 2024-03-28 DIAGNOSIS — M9902 Segmental and somatic dysfunction of thoracic region: Secondary | ICD-10-CM | POA: Diagnosis not present

## 2024-03-28 DIAGNOSIS — M62830 Muscle spasm of back: Secondary | ICD-10-CM | POA: Diagnosis not present

## 2024-03-28 DIAGNOSIS — M9903 Segmental and somatic dysfunction of lumbar region: Secondary | ICD-10-CM | POA: Diagnosis not present

## 2024-03-28 DIAGNOSIS — M9901 Segmental and somatic dysfunction of cervical region: Secondary | ICD-10-CM | POA: Diagnosis not present

## 2024-04-02 DIAGNOSIS — M9902 Segmental and somatic dysfunction of thoracic region: Secondary | ICD-10-CM | POA: Diagnosis not present

## 2024-04-02 DIAGNOSIS — M9901 Segmental and somatic dysfunction of cervical region: Secondary | ICD-10-CM | POA: Diagnosis not present

## 2024-04-02 DIAGNOSIS — M62830 Muscle spasm of back: Secondary | ICD-10-CM | POA: Diagnosis not present

## 2024-04-02 DIAGNOSIS — M9903 Segmental and somatic dysfunction of lumbar region: Secondary | ICD-10-CM | POA: Diagnosis not present

## 2024-04-04 DIAGNOSIS — M9902 Segmental and somatic dysfunction of thoracic region: Secondary | ICD-10-CM | POA: Diagnosis not present

## 2024-04-04 DIAGNOSIS — M9901 Segmental and somatic dysfunction of cervical region: Secondary | ICD-10-CM | POA: Diagnosis not present

## 2024-04-04 DIAGNOSIS — M62830 Muscle spasm of back: Secondary | ICD-10-CM | POA: Diagnosis not present

## 2024-04-04 DIAGNOSIS — M9903 Segmental and somatic dysfunction of lumbar region: Secondary | ICD-10-CM | POA: Diagnosis not present

## 2024-04-06 DIAGNOSIS — H401131 Primary open-angle glaucoma, bilateral, mild stage: Secondary | ICD-10-CM | POA: Diagnosis not present

## 2024-04-09 DIAGNOSIS — M62830 Muscle spasm of back: Secondary | ICD-10-CM | POA: Diagnosis not present

## 2024-04-09 DIAGNOSIS — M9903 Segmental and somatic dysfunction of lumbar region: Secondary | ICD-10-CM | POA: Diagnosis not present

## 2024-04-09 DIAGNOSIS — M9901 Segmental and somatic dysfunction of cervical region: Secondary | ICD-10-CM | POA: Diagnosis not present

## 2024-04-09 DIAGNOSIS — M9902 Segmental and somatic dysfunction of thoracic region: Secondary | ICD-10-CM | POA: Diagnosis not present

## 2024-04-11 DIAGNOSIS — M9901 Segmental and somatic dysfunction of cervical region: Secondary | ICD-10-CM | POA: Diagnosis not present

## 2024-04-11 DIAGNOSIS — M62830 Muscle spasm of back: Secondary | ICD-10-CM | POA: Diagnosis not present

## 2024-04-11 DIAGNOSIS — M9902 Segmental and somatic dysfunction of thoracic region: Secondary | ICD-10-CM | POA: Diagnosis not present

## 2024-04-11 DIAGNOSIS — M9903 Segmental and somatic dysfunction of lumbar region: Secondary | ICD-10-CM | POA: Diagnosis not present

## 2024-04-12 ENCOUNTER — Other Ambulatory Visit

## 2024-04-16 DIAGNOSIS — M9903 Segmental and somatic dysfunction of lumbar region: Secondary | ICD-10-CM | POA: Diagnosis not present

## 2024-04-16 DIAGNOSIS — M9902 Segmental and somatic dysfunction of thoracic region: Secondary | ICD-10-CM | POA: Diagnosis not present

## 2024-04-16 DIAGNOSIS — M62830 Muscle spasm of back: Secondary | ICD-10-CM | POA: Diagnosis not present

## 2024-04-16 DIAGNOSIS — M9901 Segmental and somatic dysfunction of cervical region: Secondary | ICD-10-CM | POA: Diagnosis not present

## 2024-04-22 ENCOUNTER — Encounter (HOSPITAL_COMMUNITY): Payer: Self-pay | Admitting: Emergency Medicine

## 2024-04-22 ENCOUNTER — Other Ambulatory Visit: Payer: Self-pay

## 2024-04-22 ENCOUNTER — Emergency Department (HOSPITAL_COMMUNITY)

## 2024-04-22 ENCOUNTER — Emergency Department (HOSPITAL_COMMUNITY)
Admission: EM | Admit: 2024-04-22 | Discharge: 2024-04-22 | Disposition: A | Discharge status: Home / Self Care | Source: Home / Self Care | Attending: Student | Admitting: Student

## 2024-04-22 ENCOUNTER — Ambulatory Visit (HOSPITAL_COMMUNITY): Admission: EM | Admit: 2024-04-22 | Discharge: 2024-04-22 | Disposition: A | Discharge status: Home / Self Care

## 2024-04-22 DIAGNOSIS — Z881 Allergy status to other antibiotic agents status: Secondary | ICD-10-CM | POA: Diagnosis not present

## 2024-04-22 DIAGNOSIS — N1832 Chronic kidney disease, stage 3b: Secondary | ICD-10-CM | POA: Diagnosis not present

## 2024-04-22 DIAGNOSIS — Z7984 Long term (current) use of oral hypoglycemic drugs: Secondary | ICD-10-CM | POA: Diagnosis not present

## 2024-04-22 DIAGNOSIS — R109 Unspecified abdominal pain: Secondary | ICD-10-CM | POA: Diagnosis not present

## 2024-04-22 DIAGNOSIS — M1990 Unspecified osteoarthritis, unspecified site: Secondary | ICD-10-CM | POA: Diagnosis not present

## 2024-04-22 DIAGNOSIS — E119 Type 2 diabetes mellitus without complications: Secondary | ICD-10-CM | POA: Insufficient documentation

## 2024-04-22 DIAGNOSIS — G2581 Restless legs syndrome: Secondary | ICD-10-CM | POA: Diagnosis not present

## 2024-04-22 DIAGNOSIS — I10 Essential (primary) hypertension: Secondary | ICD-10-CM | POA: Insufficient documentation

## 2024-04-22 DIAGNOSIS — S0003XA Contusion of scalp, initial encounter: Secondary | ICD-10-CM | POA: Diagnosis not present

## 2024-04-22 DIAGNOSIS — M4312 Spondylolisthesis, cervical region: Secondary | ICD-10-CM | POA: Diagnosis not present

## 2024-04-22 DIAGNOSIS — E559 Vitamin D deficiency, unspecified: Secondary | ICD-10-CM | POA: Diagnosis not present

## 2024-04-22 DIAGNOSIS — K5900 Constipation, unspecified: Secondary | ICD-10-CM | POA: Diagnosis not present

## 2024-04-22 DIAGNOSIS — R42 Dizziness and giddiness: Secondary | ICD-10-CM | POA: Diagnosis not present

## 2024-04-22 DIAGNOSIS — Z7982 Long term (current) use of aspirin: Secondary | ICD-10-CM | POA: Diagnosis not present

## 2024-04-22 DIAGNOSIS — I951 Orthostatic hypotension: Secondary | ICD-10-CM | POA: Diagnosis not present

## 2024-04-22 DIAGNOSIS — Z800 Family history of malignant neoplasm of digestive organs: Secondary | ICD-10-CM | POA: Diagnosis not present

## 2024-04-22 DIAGNOSIS — K8020 Calculus of gallbladder without cholecystitis without obstruction: Secondary | ICD-10-CM | POA: Diagnosis not present

## 2024-04-22 DIAGNOSIS — I6523 Occlusion and stenosis of bilateral carotid arteries: Secondary | ICD-10-CM | POA: Diagnosis not present

## 2024-04-22 DIAGNOSIS — R338 Other retention of urine: Secondary | ICD-10-CM | POA: Diagnosis not present

## 2024-04-22 DIAGNOSIS — H409 Unspecified glaucoma: Secondary | ICD-10-CM | POA: Diagnosis not present

## 2024-04-22 DIAGNOSIS — N183 Chronic kidney disease, stage 3 unspecified: Secondary | ICD-10-CM | POA: Diagnosis not present

## 2024-04-22 DIAGNOSIS — Z79899 Other long term (current) drug therapy: Secondary | ICD-10-CM | POA: Insufficient documentation

## 2024-04-22 DIAGNOSIS — H5500 Unspecified nystagmus: Secondary | ICD-10-CM | POA: Diagnosis not present

## 2024-04-22 DIAGNOSIS — Z860100 Personal history of colon polyps, unspecified: Secondary | ICD-10-CM | POA: Diagnosis not present

## 2024-04-22 DIAGNOSIS — E1169 Type 2 diabetes mellitus with other specified complication: Secondary | ICD-10-CM | POA: Diagnosis not present

## 2024-04-22 DIAGNOSIS — I152 Hypertension secondary to endocrine disorders: Secondary | ICD-10-CM | POA: Diagnosis not present

## 2024-04-22 DIAGNOSIS — E785 Hyperlipidemia, unspecified: Secondary | ICD-10-CM | POA: Diagnosis not present

## 2024-04-22 DIAGNOSIS — E1159 Type 2 diabetes mellitus with other circulatory complications: Secondary | ICD-10-CM | POA: Diagnosis not present

## 2024-04-22 DIAGNOSIS — Z7401 Bed confinement status: Secondary | ICD-10-CM | POA: Diagnosis not present

## 2024-04-22 DIAGNOSIS — R001 Bradycardia, unspecified: Secondary | ICD-10-CM | POA: Diagnosis not present

## 2024-04-22 DIAGNOSIS — Z888 Allergy status to other drugs, medicaments and biological substances status: Secondary | ICD-10-CM | POA: Diagnosis not present

## 2024-04-22 DIAGNOSIS — R339 Retention of urine, unspecified: Secondary | ICD-10-CM | POA: Diagnosis not present

## 2024-04-22 DIAGNOSIS — R55 Syncope and collapse: Secondary | ICD-10-CM | POA: Diagnosis not present

## 2024-04-22 DIAGNOSIS — G9389 Other specified disorders of brain: Secondary | ICD-10-CM | POA: Diagnosis not present

## 2024-04-22 DIAGNOSIS — E1122 Type 2 diabetes mellitus with diabetic chronic kidney disease: Secondary | ICD-10-CM | POA: Diagnosis not present

## 2024-04-22 DIAGNOSIS — H8110 Benign paroxysmal vertigo, unspecified ear: Secondary | ICD-10-CM | POA: Diagnosis not present

## 2024-04-22 LAB — COMPREHENSIVE METABOLIC PANEL WITH GFR
ALT: 22 U/L (ref 0–44)
AST: 26 U/L (ref 15–41)
Albumin: 3.7 g/dL (ref 3.5–5.0)
Alkaline Phosphatase: 92 U/L (ref 38–126)
Anion gap: 12 (ref 5–15)
BUN: 47 mg/dL — ABNORMAL HIGH (ref 8–23)
CO2: 22 mmol/L (ref 22–32)
Calcium: 10.2 mg/dL (ref 8.9–10.3)
Chloride: 101 mmol/L (ref 98–111)
Creatinine, Ser: 1.48 mg/dL — ABNORMAL HIGH (ref 0.44–1.00)
GFR, Estimated: 33 mL/min — ABNORMAL LOW (ref >60)
Glucose, Bld: 209 mg/dL — ABNORMAL HIGH (ref 70–99)
Potassium: 4.8 mmol/L (ref 3.5–5.1)
Sodium: 135 mmol/L (ref 135–145)
Total Bilirubin: 0.8 mg/dL (ref 0.0–1.2)
Total Protein: 7.7 g/dL (ref 6.5–8.1)

## 2024-04-22 LAB — URINALYSIS, ROUTINE W REFLEX MICROSCOPIC
Bacteria, UA: NONE SEEN
Bilirubin Urine: NEGATIVE
Glucose, UA: >=500 mg/dL — AB
Ketones, ur: NEGATIVE mg/dL
Nitrite: NEGATIVE
Protein, ur: NEGATIVE mg/dL
Specific Gravity, Urine: 1.006 (ref 1.005–1.030)
pH: 6 (ref 5.0–8.0)

## 2024-04-22 LAB — CBC
HCT: 39.1 % (ref 36.0–46.0)
Hemoglobin: 12.9 g/dL (ref 12.0–15.0)
MCH: 34.1 pg — ABNORMAL HIGH (ref 26.0–34.0)
MCHC: 33 g/dL (ref 30.0–36.0)
MCV: 103.4 fL — ABNORMAL HIGH (ref 80.0–100.0)
Platelets: 253 10*3/uL (ref 150–400)
RBC: 3.78 MIL/uL — ABNORMAL LOW (ref 3.87–5.11)
RDW: 13.2 % (ref 11.5–15.5)
WBC: 8.6 10*3/uL (ref 4.0–10.5)
nRBC: 0 % (ref 0.0–0.2)

## 2024-04-22 LAB — POCT FASTING CBG KUC MANUAL ENTRY: POCT Glucose (KUC): 219 mg/dL — AB (ref 70–99)

## 2024-04-22 MED ORDER — FOSFOMYCIN TROMETHAMINE 3 G PO PACK
3.0000 g | PACK | Freq: Once | ORAL | Status: AC
  Administered 2024-04-22: 3 g via ORAL
  Filled 2024-04-22: qty 3

## 2024-04-22 MED ORDER — LACTATED RINGERS IV BOLUS
1000.0000 mL | Freq: Once | INTRAVENOUS | Status: AC
  Administered 2024-04-22: 1000 mL via INTRAVENOUS

## 2024-04-22 MED ORDER — MECLIZINE HCL 25 MG PO TABS: 25.0000 mg | ORAL_TABLET | Freq: Three times a day (TID) | ORAL | 0 refills | Status: DC | PRN

## 2024-04-22 MED ORDER — MECLIZINE HCL 25 MG PO TABS
25.0000 mg | ORAL_TABLET | Freq: Once | ORAL | Status: AC
  Administered 2024-04-22: 25 mg via ORAL
  Filled 2024-04-22: qty 1

## 2024-04-22 NOTE — ED Provider Notes (Signed)
 [place] EMERGENCY DEPARTMENT AT [facility] Provider Note  CSN: [id number] Arrival date & time: 04/22/24 1746  Chief Complaint(s) Dizziness  HPI Candice Brewer is a 89 y.o. female with PMH T2DM, uterine fibroids, HTN, RLS, chronic lower extremity pain who presents emergency room for evaluation of vertigo.  States that symptoms have been present for approximately 1 week but have significant worsened in the last 48 hours.  States she feels a room spinning sensation when going from lying down to sitting up and standing.  Having difficulty walking due to the vertigo.  No previous history of vertigo.  Denies head trauma, chest pain, shortness of breath, abdominal pain, nausea, vomiting, numbness, tingling, weakness or other complaints.   Past Medical History Past Medical History:  Diagnosis Date   Arthritis    Chest pain, unspecified    Constipation    DM (diabetes mellitus) (HCC)    Dry eyes    Esophageal stricture 09/2011   schatzki's ring   Fibroids    uterus   Glaucoma    Helicobacter pylori gastritis 2012   Treated   HTN (hypertension)    Hyperlipidemia    Restless legs syndrome (RLS)    Vitamin D  deficiency    Patient Active Problem List   Diagnosis Date Noted   CKD stage 3 due to type 2 diabetes mellitus (HCC) 10/21/2023   Paresthesia of both hands 10/21/2023   Pain in left hip 09/06/2023   Pain in left knee 09/06/2023   Vitamin D  deficiency 04/20/2023   Schatzki's ring 01/03/2012    Class: History of   FH: colon cancer 09/16/2011   Esophageal dysphagia 09/16/2011   CATARACT, LEFT EYE 06/20/2009   CALLUSES, FEET, BILATERAL 06/20/2009   Hyperlipidemia associated with type 2 diabetes mellitus (HCC) 02/08/2008   Type 2 diabetes mellitus (HCC) 01/20/2007   Hypertension associated with diabetes (HCC) 10/28/2006   Home Medication(s) Prior to Admission medications   Medication Sig Start Date End Date Taking? Authorizing Provider  aspirin  EC 81 MG tablet  Take 81 mg by mouth daily at 2 PM.    [provider]  brimonidine -timolol  (COMBIGAN ) 0.2-0.5 % ophthalmic solution Place 1 drop into both eyes 2 (two) times daily.     [provider]  cholecalciferol (VITAMIN D ) 1000 units tablet Take 1,000 Units by mouth daily.    [provider]  diclofenac  Sodium (VOLTAREN ) 1 % GEL Apply 2 g topically 4 (four) times daily. 02/17/24   Galvin Jules, FNP  empagliflozin  (JARDIANCE ) 10 MG TABS tablet Take 1 tablet (10 mg total) by mouth daily. 01/25/24   Galvin Jules, FNP  enalapril  (VASOTEC ) 10 MG tablet Take 1 tablet (10 mg total) by mouth 2 (two) times daily. 01/25/24 04/24/24  Galvin Jules, FNP  hydrochlorothiazide  (HYDRODIURIL ) 25 MG tablet TAKE ONE TABLET EVERY DAY 03/07/24   Galvin Jules, FNP  latanoprost  (XALATAN ) 0.005 % ophthalmic solution Place 1 drop into both eyes at bedtime.  12/31/14   [provider]  Multiple Vitamin (MULTIVITAMIN WITH MINERALS) TABS tablet Take 1 tablet by mouth daily.    [provider]  valACYclovir (VALTREX) 1000 MG tablet Take 1,000 mg by mouth 2 (two) times daily.    [provider]  VYZULTA 0.024 % SOLN Apply 1 drop to eye at bedtime. 04/13/24   [provider]  Past Surgical History Past Surgical History:  Procedure Laterality Date   APPENDECTOMY     BREAST BIOPSY Left 03/20/2018   Procedure: BREAST BIOPSY WITH NEEDLE LOCALIZATION;  Surgeon: Awilda Bogus, MD;  Location: [facility];  Service: General;  Laterality: Left;   BREAST EXCISIONAL BIOPSY Left 2019   Atypical hyperplasia removed   CARDIOVASCULAR STRESS TEST  2009   CATARACT EXTRACTION  2010   right eye   CATARACT EXTRACTION  2012   left eye   COLONOSCOPY  08/2006   Dr. Davonna Estes   COLONOSCOPY  10/13/2011   Normal. Next TCS 09/2016.   Procedure: COLONOSCOPY;   Surgeon: Suzette Espy, MD;  Location: [facility];  Service: Endoscopy;  Laterality: N/A;  8:15   COLONOSCOPY N/A 03/10/2015   RMR: External and anal canal hemorrhoids likely source of hematochezia. Redundant colon. Single sigmoid polyp removed ad described above.    ESOPHAGOGASTRODUODENOSCOPY  06/2008   Dr. Felicie Horning ring s/p disruption, erosive reflux esophagitis, hh.    ESOPHAGOGASTRODUODENOSCOPY  09/2011   Schatki's ring s/p dilation, multiple 1-89mm antral and bulbar erosions, bx positive for H.Pylori s/ treatment   TUBAL LIGATION     Family History Family History  Problem Relation Age of Onset   Prostate cancer Father    Colon cancer Father        >age 7   Cancer Paternal Grandmother        metastatic at time of diagnosis, primary unknown   Cancer Paternal Aunt        metastatic at time of diagnosisi, primary unknown    Social History Social History   Tobacco Use   Smoking status: Never   Smokeless tobacco: Never   Tobacco comments:    quit about 35+ yrs  Vaping Use   Vaping status: Never Used  Substance Use Topics   Alcohol  use: No    Alcohol /week: 0.0 standard drinks of alcohol    Drug use: No   Allergies Benzalkonium chloride, Benzalkonium, Metformin and related, Neomycin-bacitracin zn-polymyx, Pravastatin sodium, Neomy-bacit-polymyx-pramoxine, and Rosuvastatin  Review of Systems Review of Systems  Neurological:  Positive for dizziness.    Physical Exam Vital Signs  I have reviewed the triage vital signs BP 134/62 (BP Location: Left Arm)   Pulse 73   Temp 98.5 F (36.9 C) (Oral)   Resp (!) 22   Ht 5\' 6"  (1.676 m)   Wt 60.6 kg   SpO2 95%   BMI 21.56 kg/m   Physical Exam Vitals and nursing note reviewed.  Constitutional:      General: She is not in acute distress.    Appearance: She is well-developed.  HENT:     Head: Normocephalic and atraumatic.  Eyes:     Conjunctiva/sclera: Conjunctivae normal.  Cardiovascular:     Rate and  Rhythm: Normal rate and regular rhythm.     Heart sounds: No murmur heard. Pulmonary:     Effort: Pulmonary effort is normal. No respiratory distress.     Breath sounds: Normal breath sounds.  Abdominal:     Palpations: Abdomen is soft.     Tenderness: There is no abdominal tenderness.  Musculoskeletal:        General: No swelling.     Cervical back: Neck supple.  Skin:    General: Skin is warm and dry.     Capillary Refill: Capillary refill takes less than 2 seconds.  Neurological:     Mental Status: She is alert.     Cranial Nerves: No  cranial nerve deficit.     Sensory: No sensory deficit.     Motor: No weakness.  Psychiatric:        Mood and Affect: Mood normal.     ED Results and Treatments Labs (all labs ordered are listed, but only abnormal results are displayed) Labs Reviewed  COMPREHENSIVE METABOLIC PANEL WITH GFR - Abnormal; Notable for the following components:      Result Value   Glucose, Bld 209 (*)    BUN 47 (*)    Creatinine, Ser 1.48 (*)    GFR, Estimated 33 (*)    All other components within normal limits  CBC - Abnormal; Notable for the following components:   RBC 3.78 (*)    MCV 103.4 (*)    MCH 34.1 (*)    All other components within normal limits  URINALYSIS, ROUTINE W REFLEX MICROSCOPIC - Abnormal; Notable for the following components:   Color, Urine STRAW (*)    Glucose, UA >=500 (*)    Hgb urine dipstick SMALL (*)    Leukocytes,Ua LARGE (*)    All other components within normal limits  CBG MONITORING, ED                                                                                                                          Radiology CT Head Wo Contrast Result Date: 04/22/2024 CLINICAL DATA:  Dizziness. EXAM: CT HEAD WITHOUT CONTRAST TECHNIQUE: Contiguous axial images were obtained from the base of the skull through the vertex without intravenous contrast. RADIATION DOSE REDUCTION: This exam was performed according to the departmental  dose-optimization program which includes automated exposure control, adjustment of the mA and/or kV according to patient size and/or use of iterative reconstruction technique. COMPARISON:  Right twenty-fifth 2012 FINDINGS: Brain: There is mild cerebral atrophy with widening of the extra-axial spaces and ventricular dilatation. There is no evidence of acute infarction, hemorrhage, hydrocephalus, extra-axial collection or mass lesion/mass effect. Vascular: Moderate to marked severity bilateral cavernous carotid artery calcification is noted. Skull: Normal. Negative for fracture or focal lesion. Sinuses/Orbits: No acute finding. Other: None. IMPRESSION: No acute intracranial abnormality. Electronically Signed   By: Virgle Grime M.D.   On: 04/22/2024 22:27    Pertinent labs & imaging results that were available during my care of the patient were reviewed by me and considered in my medical decision making (see MDM for details).  Medications Ordered in ED Medications  fosfomycin (MONUROL) packet 3 g (has no administration in time range)  meclizine (ANTIVERT) tablet 25 mg (25 mg Oral Given 04/22/24 2146)  lactated ringers  bolus 1,000 mL (1,000 mLs Intravenous New Bag/Given 04/22/24 2151)  Procedures Procedures  (including critical care time)  Medical Decision Making / ED Course   This patient presents to the ED for concern of dizziness, this involves an extensive number of treatment options, and is a complaint that carries with it a high risk of complications and morbidity.  The differential diagnosis includes BPPV, CVA, electrolyte abnormality, labyrinthitis/vestibular neuritis, Meniere's disease  MDM: Patient seen in the emergency room for evaluation of vertigo.  Physical exam with reproducible nystagmus that is fatigable with no nystagmus at rest.  Neurologic exam  is otherwise unremarkable with no focal motor or sensory deficits.  No cranial nerve deficits.  Laboratory evaluation with a BUN of 47, creatinine 1.48 which is a mild elevation from previous and fluid resuscitation begun.  Urinalysis with large leuk esterase, 21-50 white blood cells but no bacteria and we will cover with fosfomycin.  CT head reassuringly negative.  Patient given meclizine on reevaluation symptoms have improved.  Able to stand without recurrence of symptoms.  Instructions for Epley maneuvers given she will be discharged with a prescription for as needed meclizine.  I have very low suspicion for central stroke today given fatigable and reproducible symptoms that occur only with change of head position.  Return precautions given of which she and her grandson voiced understanding.  Patient discharged   Additional history obtained: -Additional history obtained from grandson -External records from outside source obtained and reviewed including: Chart review including previous notes, labs, imaging, consultation notes   Lab Tests: -I ordered, reviewed, and interpreted labs.   The pertinent results include:   Labs Reviewed  COMPREHENSIVE METABOLIC PANEL WITH GFR - Abnormal; Notable for the following components:      Result Value   Glucose, Bld 209 (*)    BUN 47 (*)    Creatinine, Ser 1.48 (*)    GFR, Estimated 33 (*)    All other components within normal limits  CBC - Abnormal; Notable for the following components:   RBC 3.78 (*)    MCV 103.4 (*)    MCH 34.1 (*)    All other components within normal limits  URINALYSIS, ROUTINE W REFLEX MICROSCOPIC - Abnormal; Notable for the following components:   Color, Urine STRAW (*)    Glucose, UA >=500 (*)    Hgb urine dipstick SMALL (*)    Leukocytes,Ua LARGE (*)    All other components within normal limits  CBG MONITORING, ED      Imaging Studies ordered: I ordered imaging studies including CT head I independently visualized and  interpreted imaging. I agree with the radiologist interpretation   Medicines ordered and prescription drug management: Meds ordered this encounter  Medications   meclizine (ANTIVERT) tablet 25 mg   lactated ringers  bolus 1,000 mL   fosfomycin (MONUROL) packet 3 g    -I have reviewed the patients home medicines and have made adjustments as needed  Critical interventions none  Cardiac Monitoring: The patient was maintained on a cardiac monitor.  I personally viewed and interpreted the cardiac monitored which showed an underlying rhythm of: NSR  Social Determinants of Health:  Factors impacting patients care include: none   Reevaluation: After the interventions noted above, I reevaluated the patient and found that they have :improved  Co morbidities that complicate the patient evaluation  Past Medical History:  Diagnosis Date   Arthritis    Chest pain, unspecified    Constipation    DM (diabetes mellitus) (HCC)    Dry eyes    Esophageal stricture  09/2011   schatzki's ring   Fibroids    uterus   Glaucoma    Helicobacter pylori gastritis 2012   Treated   HTN (hypertension)    Hyperlipidemia    Restless legs syndrome (RLS)    Vitamin D  deficiency       Dispostion: I considered admission for this patient, but at this time she does not meet inpatient criteria for admission and will be discharged with outpatient follow-up     Final Clinical Impression(s) / ED Diagnoses Final diagnoses:  None     @PCDICTATION @    Karlyn Overman, MD 04/23/24 [phone]

## 2024-04-22 NOTE — Discharge Instructions (Addendum)
Please go to the emergency department for further evaluation of your symptoms.

## 2024-04-22 NOTE — ED Provider Notes (Signed)
 [facility]    CSN: [id number] Arrival date & time: 04/22/24  1622      History   Chief Complaint Chief Complaint  Patient presents with   Dizziness    HPI Candice Brewer is a 89 y.o. female.   Patient presents with grandson for dizziness and lightheadedness that is causing her to be off balance that is progressively worsened over the last week.  Patient states over the last 24 hours the symptoms have become even worse.  Patient reports that she is now also hearing a buzzing noise in both of her ears that is constant.  Patient already has difficulty walking baseline and has to use a walker due to some chronic pain in her leg.  Patient states that upon standing she becomes very dizzy and sometimes feel like she might even pass out.  Patient states that she also has been having some intermittent bilateral blurry vision as well.  Denies chest pain, shortness of breath, unilateral weakness or numbness, confusion, slurred speech, headache, and loss of consciousness.  Patient has history of hypertension, type 2 diabetes, CKD.  Patient states that she takes her medications as prescribed daily.  The history is provided by the patient, medical records and a relative.  Dizziness   Past Medical History:  Diagnosis Date   Arthritis    Chest pain, unspecified    Constipation    DM (diabetes mellitus) (HCC)    Dry eyes    Esophageal stricture 09/2011   schatzki's ring   Fibroids    uterus   Glaucoma    Helicobacter pylori gastritis 2012   Treated   HTN (hypertension)    Hyperlipidemia    Restless legs syndrome (RLS)    Vitamin D  deficiency     Patient Active Problem List   Diagnosis Date Noted   CKD stage 3 due to type 2 diabetes mellitus (HCC) 10/21/2023   Paresthesia of both hands 10/21/2023   Pain in left hip 09/06/2023   Pain in left knee 09/06/2023   Vitamin D  deficiency 04/20/2023   Schatzki's ring 01/03/2012    Class: History of   FH: colon cancer  09/16/2011   Esophageal dysphagia 09/16/2011   CATARACT, LEFT EYE 06/20/2009   CALLUSES, FEET, BILATERAL 06/20/2009   Hyperlipidemia associated with type 2 diabetes mellitus (HCC) 02/08/2008   Type 2 diabetes mellitus (HCC) 01/20/2007   Hypertension associated with diabetes (HCC) 10/28/2006    Past Surgical History:  Procedure Laterality Date   APPENDECTOMY     BREAST BIOPSY Left 03/20/2018   Procedure: BREAST BIOPSY WITH NEEDLE LOCALIZATION;  Surgeon: Awilda Bogus, MD;  Location: [facility];  Service: General;  Laterality: Left;   BREAST EXCISIONAL BIOPSY Left 2019   Atypical hyperplasia removed   CARDIOVASCULAR STRESS TEST  2009   CATARACT EXTRACTION  2010   right eye   CATARACT EXTRACTION  2012   left eye   COLONOSCOPY  08/2006   Dr. Davonna Estes   COLONOSCOPY  10/13/2011   Normal. Next TCS 09/2016.   Procedure: COLONOSCOPY;  Surgeon: Suzette Espy, MD;  Location: [facility];  Service: Endoscopy;  Laterality: N/A;  8:15   COLONOSCOPY N/A 03/10/2015   RMR: External and anal canal hemorrhoids likely source of hematochezia. Redundant colon. Single sigmoid polyp removed ad described above.    ESOPHAGOGASTRODUODENOSCOPY  06/2008   Dr. Felicie Horning ring s/p disruption, erosive reflux esophagitis, hh.    ESOPHAGOGASTRODUODENOSCOPY  09/2011   Schatki's ring s/p dilation, multiple 1-68mm  antral and bulbar erosions, bx positive for H.Pylori s/ treatment   TUBAL LIGATION      OB History   No obstetric history on file.      Home Medications    Prior to Admission medications   Medication Sig Start Date End Date Taking? Authorizing Provider  VYZULTA 0.024 % SOLN Apply 1 drop to eye at bedtime. 04/13/24  Yes [provider]  aspirin  EC 81 MG tablet Take 81 mg by mouth daily at 2 PM.    [provider]  brimonidine -timolol  (COMBIGAN ) 0.2-0.5 % ophthalmic solution Place 1 drop into both eyes 2 (two) times daily.     [provider]  cholecalciferol  (VITAMIN D ) 1000 units tablet Take 1,000 Units by mouth daily.    [provider]  diclofenac  Sodium (VOLTAREN ) 1 % GEL Apply 2 g topically 4 (four) times daily. 02/17/24   Galvin Jules, FNP  empagliflozin  (JARDIANCE ) 10 MG TABS tablet Take 1 tablet (10 mg total) by mouth daily. 01/25/24   Galvin Jules, FNP  enalapril  (VASOTEC ) 10 MG tablet Take 1 tablet (10 mg total) by mouth 2 (two) times daily. 01/25/24 04/24/24  Galvin Jules, FNP  hydrochlorothiazide  (HYDRODIURIL ) 25 MG tablet TAKE ONE TABLET EVERY DAY 03/07/24   Galvin Jules, FNP  latanoprost  (XALATAN ) 0.005 % ophthalmic solution Place 1 drop into both eyes at bedtime.  12/31/14   [provider]  Multiple Vitamin (MULTIVITAMIN WITH MINERALS) TABS tablet Take 1 tablet by mouth daily.    [provider]  valACYclovir (VALTREX) 1000 MG tablet Take 1,000 mg by mouth 2 (two) times daily.    [provider]    Family History Family History  Problem Relation Age of Onset   Prostate cancer Father    Colon cancer Father        >age 1   Cancer Paternal Grandmother        metastatic at time of diagnosis, primary unknown   Cancer Paternal Aunt        metastatic at time of diagnosisi, primary unknown    Social History Social History   Tobacco Use   Smoking status: Never   Smokeless tobacco: Never   Tobacco comments:    quit about 35+ yrs  Vaping Use   Vaping status: Never Used  Substance Use Topics   Alcohol  use: No    Alcohol /week: 0.0 standard drinks of alcohol    Drug use: No     Allergies   Benzalkonium chloride, Benzalkonium, Metformin and related, Neomycin-bacitracin zn-polymyx, Pravastatin sodium, Neomy-bacit-polymyx-pramoxine, and Rosuvastatin   Review of Systems Review of Systems  Neurological:  Positive for dizziness.   Per HPI  Physical Exam Triage Vital Signs ED Triage Vitals  Encounter Vitals Group     BP 04/22/24 1657 (!) 144/75     Systolic BP Percentile --       Diastolic BP Percentile --      Pulse Rate 04/22/24 1657 68     Resp 04/22/24 1657 20     Temp 04/22/24 1657 98.5 F (36.9 C)     Temp Source 04/22/24 1657 Oral     SpO2 04/22/24 1657 98 %     Weight --      Height --      Head Circumference --      Peak Flow --      Pain Score 04/22/24 1655 6     Pain Loc --      Pain Education --  Exclude from Growth Chart --    No data found.  Updated Vital Signs BP (!) 144/75 (BP Location: Left Arm)   Pulse 68   Temp 98.5 F (36.9 C) (Oral)   Resp 20   SpO2 98%   Visual Acuity Right Eye Distance:   Left Eye Distance:   Bilateral Distance:    Right Eye Near:   Left Eye Near:    Bilateral Near:     Physical Exam Vitals and nursing note reviewed.  Constitutional:      General: She is awake. She is not in acute distress.    Appearance: Normal appearance. She is well-developed and well-groomed. She is not ill-appearing.  HENT:     Right Ear: Tympanic membrane, ear canal and external ear normal.     Left Ear: Tympanic membrane, ear canal and external ear normal.  Eyes:     Extraocular Movements: Extraocular movements intact.     Pupils: Pupils are equal, round, and reactive to light.  Cardiovascular:     Rate and Rhythm: Normal rate and regular rhythm.  Pulmonary:     Effort: Pulmonary effort is normal.     Breath sounds: Normal breath sounds.  Skin:    General: Skin is warm and dry.  Neurological:     General: No focal deficit present.     Mental Status: She is alert and oriented to person, place, and time. Mental status is at baseline.     GCS: GCS eye subscore is 4. GCS verbal subscore is 5. GCS motor subscore is 6.     Cranial Nerves: Cranial nerves 2-12 are intact.     Sensory: Sensation is intact.     Motor: Motor function is intact.     Coordination: Romberg sign positive.     Gait: Gait is intact.  Psychiatric:        Behavior: Behavior is cooperative.      UC Treatments / Results  Labs (all labs ordered  are listed, but only abnormal results are displayed) Labs Reviewed  POCT FASTING CBG [facility] MANUAL ENTRY - Abnormal; Notable for the following components:      Result Value   POCT Glucose ([facility]) 219 (*)    All other components within normal limits    EKG   Radiology No results found.  Procedures Procedures (including critical care time)  Medications Ordered in UC Medications - No data to display  Initial Impression / Assessment and Plan / UC Course  I have reviewed the triage vital signs and the nursing notes.  Pertinent labs & imaging results that were available during my care of the patient were reviewed by me and considered in my medical decision making (see chart for details).     Patient is well-appearing.  Vitals are stable.  Blood pressure is mildly elevated at 144/75.  Positive Romberg on exam.  Otherwise no other neuro deficits noted on.  GCS 15.  EOMI and PERRLA.  EKG reveals normal sinus rhythm with possible anterior septal infarct with undetermined age.  EKG does appear to be different from previous.  CBG is elevated at 219 in clinic.  Due to progressively worsening symptoms, age, and medical history recommended patient be seen in the emergency department for further evaluation to rule out any underlying issues causing her dizziness and lightheadedness.  Patient and grandson agreeable to plan at this time.  Patient is stable to arrived to the ER via POV with grandson. Final Clinical Impressions(s) / UC Diagnoses  Final diagnoses:  Dizziness  Lightheadedness     Discharge Instructions      Please go to the emergency department for further evaluation of your symptoms.  ED Prescriptions   None    PDMP not reviewed this encounter.   Levora Reas A, NP 04/22/24 1739

## 2024-04-22 NOTE — ED Triage Notes (Signed)
 pt had dizziness, lightheadedness causing balance to be off. Also hearing a buzzing noise that has been constant. For past week to week and half having the dizziness or moving of her head.

## 2024-04-22 NOTE — ED Notes (Signed)
 Patient is being discharged from the Urgent Care and sent to the Emergency Department via personal opperated vehicle . Per Levora Reas Np, patient is in need of higher level of care due to dizziness. Patient is aware and verbalizes understanding of plan of care.  Vitals:   04/22/24 1657  BP: (!) 144/75  Pulse: 68  Resp: 20  Temp: 98.5 F (36.9 C)  SpO2: 98%

## 2024-04-22 NOTE — ED Triage Notes (Signed)
 When pt is lying down she feels fine. When sitting up room begins to spin. When standing pt has to steady herself. This has been going on for a week. Has gotten worse the last 2 days. Has not had any falls and is unsteady while walking.

## 2024-04-24 ENCOUNTER — Emergency Department (HOSPITAL_COMMUNITY)

## 2024-04-24 ENCOUNTER — Other Ambulatory Visit: Payer: Self-pay

## 2024-04-24 ENCOUNTER — Inpatient Hospital Stay (HOSPITAL_COMMUNITY)
Admission: EM | Admit: 2024-04-24 | Discharge: 2024-04-26 | DRG: 312 | Disposition: A | Discharge status: Home Health | Attending: Internal Medicine | Admitting: Internal Medicine

## 2024-04-24 ENCOUNTER — Encounter (HOSPITAL_COMMUNITY): Payer: Self-pay

## 2024-04-24 DIAGNOSIS — E785 Hyperlipidemia, unspecified: Secondary | ICD-10-CM | POA: Diagnosis present

## 2024-04-24 DIAGNOSIS — R55 Syncope and collapse: Principal | ICD-10-CM | POA: Diagnosis present

## 2024-04-24 DIAGNOSIS — E1169 Type 2 diabetes mellitus with other specified complication: Secondary | ICD-10-CM

## 2024-04-24 DIAGNOSIS — E1122 Type 2 diabetes mellitus with diabetic chronic kidney disease: Secondary | ICD-10-CM | POA: Diagnosis not present

## 2024-04-24 DIAGNOSIS — R339 Retention of urine, unspecified: Secondary | ICD-10-CM | POA: Diagnosis present

## 2024-04-24 DIAGNOSIS — I951 Orthostatic hypotension: Principal | ICD-10-CM | POA: Diagnosis present

## 2024-04-24 DIAGNOSIS — Z800 Family history of malignant neoplasm of digestive organs: Secondary | ICD-10-CM

## 2024-04-24 DIAGNOSIS — R609 Edema, unspecified: Secondary | ICD-10-CM | POA: Diagnosis not present

## 2024-04-24 DIAGNOSIS — Z7982 Long term (current) use of aspirin: Secondary | ICD-10-CM

## 2024-04-24 DIAGNOSIS — N1832 Chronic kidney disease, stage 3b: Secondary | ICD-10-CM | POA: Diagnosis present

## 2024-04-24 DIAGNOSIS — E1159 Type 2 diabetes mellitus with other circulatory complications: Secondary | ICD-10-CM | POA: Diagnosis not present

## 2024-04-24 DIAGNOSIS — Z881 Allergy status to other antibiotic agents status: Secondary | ICD-10-CM

## 2024-04-24 DIAGNOSIS — K5900 Constipation, unspecified: Secondary | ICD-10-CM | POA: Diagnosis present

## 2024-04-24 DIAGNOSIS — W19XXXA Unspecified fall, initial encounter: Secondary | ICD-10-CM | POA: Diagnosis not present

## 2024-04-24 DIAGNOSIS — M1990 Unspecified osteoarthritis, unspecified site: Secondary | ICD-10-CM | POA: Diagnosis present

## 2024-04-24 DIAGNOSIS — R338 Other retention of urine: Secondary | ICD-10-CM | POA: Diagnosis not present

## 2024-04-24 DIAGNOSIS — I10 Essential (primary) hypertension: Secondary | ICD-10-CM | POA: Diagnosis not present

## 2024-04-24 DIAGNOSIS — E559 Vitamin D deficiency, unspecified: Secondary | ICD-10-CM | POA: Diagnosis present

## 2024-04-24 DIAGNOSIS — Z860100 Personal history of colon polyps, unspecified: Secondary | ICD-10-CM

## 2024-04-24 DIAGNOSIS — Z8042 Family history of malignant neoplasm of prostate: Secondary | ICD-10-CM

## 2024-04-24 DIAGNOSIS — E119 Type 2 diabetes mellitus without complications: Secondary | ICD-10-CM

## 2024-04-24 DIAGNOSIS — N183 Chronic kidney disease, stage 3 unspecified: Secondary | ICD-10-CM

## 2024-04-24 DIAGNOSIS — R42 Dizziness and giddiness: Secondary | ICD-10-CM | POA: Diagnosis not present

## 2024-04-24 DIAGNOSIS — Z79899 Other long term (current) drug therapy: Secondary | ICD-10-CM

## 2024-04-24 DIAGNOSIS — H5500 Unspecified nystagmus: Secondary | ICD-10-CM | POA: Diagnosis present

## 2024-04-24 DIAGNOSIS — S0003XA Contusion of scalp, initial encounter: Secondary | ICD-10-CM | POA: Diagnosis present

## 2024-04-24 DIAGNOSIS — Z7984 Long term (current) use of oral hypoglycemic drugs: Secondary | ICD-10-CM

## 2024-04-24 DIAGNOSIS — H8110 Benign paroxysmal vertigo, unspecified ear: Secondary | ICD-10-CM | POA: Diagnosis present

## 2024-04-24 DIAGNOSIS — I152 Hypertension secondary to endocrine disorders: Secondary | ICD-10-CM | POA: Diagnosis present

## 2024-04-24 DIAGNOSIS — Y92009 Unspecified place in unspecified non-institutional (private) residence as the place of occurrence of the external cause: Secondary | ICD-10-CM

## 2024-04-24 DIAGNOSIS — R001 Bradycardia, unspecified: Secondary | ICD-10-CM

## 2024-04-24 DIAGNOSIS — G2581 Restless legs syndrome: Secondary | ICD-10-CM | POA: Diagnosis present

## 2024-04-24 DIAGNOSIS — H409 Unspecified glaucoma: Secondary | ICD-10-CM | POA: Diagnosis present

## 2024-04-24 DIAGNOSIS — Z888 Allergy status to other drugs, medicaments and biological substances status: Secondary | ICD-10-CM

## 2024-04-24 LAB — CBC
HCT: 40.4 % (ref 36.0–46.0)
Hemoglobin: 13.6 g/dL (ref 12.0–15.0)
MCH: 34.4 pg — ABNORMAL HIGH (ref 26.0–34.0)
MCHC: 33.7 g/dL (ref 30.0–36.0)
MCV: 102.3 fL — ABNORMAL HIGH (ref 80.0–100.0)
Platelets: 250 10*3/uL (ref 150–400)
RBC: 3.95 MIL/uL (ref 3.87–5.11)
RDW: 13.2 % (ref 11.5–15.5)
WBC: 8.1 10*3/uL (ref 4.0–10.5)
nRBC: 0 % (ref 0.0–0.2)

## 2024-04-24 LAB — BASIC METABOLIC PANEL WITH GFR
Anion gap: 13 (ref 5–15)
BUN: 37 mg/dL — ABNORMAL HIGH (ref 8–23)
CO2: 23 mmol/L (ref 22–32)
Calcium: 10.1 mg/dL (ref 8.9–10.3)
Chloride: 102 mmol/L (ref 98–111)
Creatinine, Ser: 1.45 mg/dL — ABNORMAL HIGH (ref 0.44–1.00)
GFR, Estimated: 34 mL/min — ABNORMAL LOW (ref >60)
Glucose, Bld: 221 mg/dL — ABNORMAL HIGH (ref 70–99)
Potassium: 4.6 mmol/L (ref 3.5–5.1)
Sodium: 138 mmol/L (ref 135–145)

## 2024-04-24 LAB — CBG MONITORING, ED
Glucose-Capillary: 196 mg/dL — ABNORMAL HIGH (ref 70–99)
Glucose-Capillary: 202 mg/dL — ABNORMAL HIGH (ref 70–99)
Glucose-Capillary: 307 mg/dL — ABNORMAL HIGH (ref 70–99)

## 2024-04-24 LAB — URINALYSIS, W/ REFLEX TO CULTURE (INFECTION SUSPECTED)
Bacteria, UA: NONE SEEN
Bilirubin Urine: NEGATIVE
Glucose, UA: >=500 mg/dL — AB
Hgb urine dipstick: NEGATIVE
Ketones, ur: NEGATIVE mg/dL
Leukocytes,Ua: NEGATIVE
Nitrite: NEGATIVE
Protein, ur: NEGATIVE mg/dL
Specific Gravity, Urine: 1.014 (ref 1.005–1.030)
pH: 7 (ref 5.0–8.0)

## 2024-04-24 LAB — TROPONIN I (HIGH SENSITIVITY)
Troponin I (High Sensitivity): 8 ng/L (ref <18)
Troponin I (High Sensitivity): 12 ng/L (ref <18)

## 2024-04-24 LAB — TSH: TSH: 1.574 u[IU]/mL (ref 0.350–4.500)

## 2024-04-24 LAB — MAGNESIUM: Magnesium: 1.9 mg/dL (ref 1.7–2.4)

## 2024-04-24 MED ORDER — VALACYCLOVIR HCL 500 MG PO TABS
1000.0000 mg | ORAL_TABLET | Freq: Two times a day (BID) | ORAL | Status: DC
  Administered 2024-04-24 – 2024-04-25 (×3): 1000 mg via ORAL
  Filled 2024-04-24 (×4): qty 2

## 2024-04-24 MED ORDER — IOHEXOL 300 MG/ML  SOLN
80.0000 mL | Freq: Once | INTRAMUSCULAR | Status: AC | PRN
  Administered 2024-04-24: 80 mL via INTRAVENOUS

## 2024-04-24 MED ORDER — EPINEPHRINE 1 MG/10ML IJ SOSY
PREFILLED_SYRINGE | INTRAMUSCULAR | Status: AC
  Filled 2024-04-24: qty 10

## 2024-04-24 MED ORDER — ACETAMINOPHEN 325 MG PO TABS
650.0000 mg | ORAL_TABLET | Freq: Four times a day (QID) | ORAL | Status: DC | PRN
  Administered 2024-04-24: 650 mg via ORAL
  Filled 2024-04-24: qty 2

## 2024-04-24 MED ORDER — TIMOLOL MALEATE 0.5 % OP SOLN
1.0000 [drp] | Freq: Two times a day (BID) | OPHTHALMIC | Status: DC
  Administered 2024-04-24 – 2024-04-26 (×4): 1 [drp] via OPHTHALMIC
  Filled 2024-04-24: qty 5

## 2024-04-24 MED ORDER — ONDANSETRON HCL 4 MG PO TABS: 4.0000 mg | ORAL_TABLET | Freq: Four times a day (QID) | ORAL | Status: DC | PRN

## 2024-04-24 MED ORDER — POLYETHYLENE GLYCOL 3350 17 G PO PACK
17.0000 g | PACK | Freq: Every day | ORAL | Status: DC | PRN
Start: 2024-04-24 — End: 2024-04-26

## 2024-04-24 MED ORDER — ACETAMINOPHEN 650 MG RE SUPP: 650.0000 mg | Freq: Four times a day (QID) | RECTAL | Status: DC | PRN

## 2024-04-24 MED ORDER — LATANOPROST 0.005 % OP SOLN
1.0000 [drp] | Freq: Every day | OPHTHALMIC | Status: DC
  Administered 2024-04-24 – 2024-04-25 (×2): 1 [drp] via OPHTHALMIC
  Filled 2024-04-24: qty 2.5

## 2024-04-24 MED ORDER — ONDANSETRON HCL 4 MG/2ML IJ SOLN: 4.0000 mg | Freq: Four times a day (QID) | INTRAMUSCULAR | Status: DC | PRN

## 2024-04-24 MED ORDER — BRIMONIDINE TARTRATE-TIMOLOL 0.2-0.5 % OP SOLN: 1.0000 [drp] | Freq: Two times a day (BID) | OPHTHALMIC | Status: DC

## 2024-04-24 MED ORDER — INSULIN ASPART 100 UNIT/ML IJ SOLN
0.0000 [IU] | Freq: Four times a day (QID) | INTRAMUSCULAR | Status: DC
  Administered 2024-04-24: 7 [IU] via SUBCUTANEOUS
  Administered 2024-04-25: 5 [IU] via SUBCUTANEOUS
  Administered 2024-04-25: 2 [IU] via SUBCUTANEOUS
  Administered 2024-04-25: 1 [IU] via SUBCUTANEOUS
  Filled 2024-04-24: qty 1

## 2024-04-24 MED ORDER — ASPIRIN 81 MG PO TBEC
81.0000 mg | DELAYED_RELEASE_TABLET | Freq: Every day | ORAL | Status: DC
  Administered 2024-04-25 – 2024-04-26 (×2): 81 mg via ORAL
  Filled 2024-04-24 (×2): qty 1

## 2024-04-24 MED ORDER — ATROPINE SULFATE 1 MG/ML IV SOLN
INTRAVENOUS | Status: AC
  Filled 2024-04-24: qty 1

## 2024-04-24 MED ORDER — BRIMONIDINE TARTRATE 0.2 % OP SOLN
1.0000 [drp] | Freq: Two times a day (BID) | OPHTHALMIC | Status: DC
  Administered 2024-04-24 – 2024-04-26 (×4): 1 [drp] via OPHTHALMIC
  Filled 2024-04-24: qty 5

## 2024-04-24 MED ORDER — LATANOPROSTENE BUNOD 0.024 % OP SOLN: 1.0000 [drp] | Freq: Every day | OPHTHALMIC | Status: DC

## 2024-04-24 MED ORDER — ATROPINE SULFATE 1 MG/10ML IJ SOSY
PREFILLED_SYRINGE | INTRAMUSCULAR | Status: DC
Start: 2024-04-24 — End: 2024-04-24
  Filled 2024-04-24: qty 10

## 2024-04-24 NOTE — ED Notes (Signed)
 [facility] her to transport pt

## 2024-04-24 NOTE — ED Provider Notes (Signed)
 [place] EMERGENCY DEPARTMENT AT [facility] Provider Note   CSN: [id number] Arrival date & time: 04/24/24  1023     History  Chief Complaint  Patient presents with   Near Syncope    Candice Brewer is a 89 y.o. female.   Near Syncope  Patient presents with syncopal episode.  Reportedly in bathroom brushing teeth and passed out.  Has hematoma to the head but states she does not think she hit her head.  Recently diagnosed with vertigo.  Denies other injury.     Home Medications Prior to Admission medications   Medication Sig Start Date End Date Taking? Authorizing Provider  aspirin  EC 81 MG tablet Take 81 mg by mouth daily at 2 PM.    [provider]  brimonidine -timolol  (COMBIGAN ) 0.2-0.5 % ophthalmic solution Place 1 drop into both eyes 2 (two) times daily.     [provider]  cholecalciferol (VITAMIN D ) 1000 units tablet Take 1,000 Units by mouth daily.    [provider]  diclofenac  Sodium (VOLTAREN ) 1 % GEL Apply 2 g topically 4 (four) times daily. 02/17/24   Galvin Jules, FNP  empagliflozin  (JARDIANCE ) 10 MG TABS tablet Take 1 tablet (10 mg total) by mouth daily. 01/25/24   Galvin Jules, FNP  enalapril  (VASOTEC ) 10 MG tablet Take 1 tablet (10 mg total) by mouth 2 (two) times daily. 01/25/24 04/24/24  Galvin Jules, FNP  hydrochlorothiazide  (HYDRODIURIL ) 25 MG tablet TAKE ONE TABLET EVERY DAY 03/07/24   Galvin Jules, FNP  latanoprost  (XALATAN ) 0.005 % ophthalmic solution Place 1 drop into both eyes at bedtime.  12/31/14   [provider]  meclizine (ANTIVERT) 25 MG tablet Take 1 tablet (25 mg total) by mouth 3 (three) times daily as needed for dizziness. 04/22/24   Kommor, [place], MD  Multiple Vitamin (MULTIVITAMIN WITH MINERALS) TABS tablet Take 1 tablet by mouth daily.    [provider]  valACYclovir (VALTREX) 1000 MG tablet Take 1,000 mg by mouth 2 (two) times daily.    [provider]  VYZULTA 0.024 % SOLN  Apply 1 drop to eye at bedtime. 04/13/24   [provider]      Allergies    Benzalkonium chloride, Benzalkonium, Metformin and related, Neomycin-bacitracin zn-polymyx, Pravastatin sodium, Neomy-bacit-polymyx-pramoxine, and Rosuvastatin    Review of Systems   Review of Systems  Cardiovascular:  Positive for near-syncope.    Physical Exam Updated Vital Signs BP (!) 101/53   Pulse (!) 49   Temp 97.7 F (36.5 C) (Oral)   Resp 18   Ht 5\' 6"  (1.676 m)   Wt 54.4 kg   SpO2 99%   BMI 19.37 kg/m  Physical Exam Vitals and nursing note reviewed.  HENT:     Head:     Comments: Hematoma to right scalp. Cardiovascular:     Rate and Rhythm: Regular rhythm.  Abdominal:     Tenderness: There is no abdominal tenderness.  Skin:    Capillary Refill: Capillary refill takes less than 2 seconds.  Neurological:     Mental Status: She is alert. Mental status is at baseline.     ED Results / Procedures / Treatments   Labs (all labs ordered are listed, but only abnormal results are displayed) Labs Reviewed  BASIC METABOLIC PANEL WITH GFR - Abnormal; Notable for the following components:      Result Value   Glucose, Bld 221 (*)    BUN 37 (*)  Creatinine, Ser 1.45 (*)    GFR, Estimated 34 (*)    All other components within normal limits  CBC - Abnormal; Notable for the following components:   MCV 102.3 (*)    MCH 34.4 (*)    All other components within normal limits  CBG MONITORING, ED - Abnormal; Notable for the following components:   Glucose-Capillary 196 (*)    All other components within normal limits  CBG MONITORING, ED - Abnormal; Notable for the following components:   Glucose-Capillary 202 (*)    All other components within normal limits  URINALYSIS, W/ REFLEX TO CULTURE (INFECTION SUSPECTED)  TROPONIN I (HIGH SENSITIVITY)  TROPONIN I (HIGH SENSITIVITY)    EKG None  Radiology DG Chest Portable 1 View Result Date: 04/24/2024 CLINICAL DATA:  syncope EXAM:  PORTABLE CHEST - 1 VIEW COMPARISON:  05/12/2016 FINDINGS: Coarse perihilar and infrahilar interstitial markings, slightly decreased. No new infiltrate or overt edema. Heart size and mediastinal contours are within normal limits. Aortic Atherosclerosis (ICD10-170.0). Chronic blunting of the right lateral costophrenic angle. Visualized bones unremarkable. IMPRESSION: Chronic interstitial changes without acute findings. Electronically Signed   By: Nicoletta Barrier M.D.   On: 04/24/2024 13:47   CT Cervical Spine Wo Contrast Result Date: 04/24/2024 CLINICAL DATA:  89 year old female with dizziness, syncope. EXAM: CT CERVICAL SPINE WITHOUT CONTRAST TECHNIQUE: Multidetector CT imaging of the cervical spine was performed without intravenous contrast. Multiplanar CT image reconstructions were also generated. RADIATION DOSE REDUCTION: This exam was performed according to the departmental dose-optimization program which includes automated exposure control, adjustment of the mA and/or kV according to patient size and/or use of iterative reconstruction technique. COMPARISON:  Head CT today. FINDINGS: Alignment: Mild straightening of cervical lordosis. Cervicothoracic junction alignment is within normal limits. Maintained posterior element alignment. Skull base and vertebrae: Bone mineralization is within normal limits for age. Visualized skull base is intact. No atlanto-occipital dissociation. C1 and C2 appear intact and aligned. No acute osseous abnormality identified. Soft tissues and spinal canal: No prevertebral fluid or swelling. No visible canal hematoma. Negative visible noncontrast neck soft tissues, mild for age calcified carotid atherosclerosis. Disc levels: Degenerative appearing mild anterolisthesis of C4 on C5. Cervical spine degeneration elsewhere is mild for age. Upper chest: Visible upper thoracic levels appear intact. Mild apical lung scarring. IMPRESSION: 1. No acute traumatic injury identified in the cervical  spine. 2. Generally mild for age cervical spine degeneration. Electronically Signed   By: Marlise Simpers M.D.   On: 04/24/2024 11:57   CT Head Wo Contrast Result Date: 04/24/2024 CLINICAL DATA:  89 year old female with dizziness, syncope. EXAM: CT HEAD WITHOUT CONTRAST TECHNIQUE: Contiguous axial images were obtained from the base of the skull through the vertex without intravenous contrast. RADIATION DOSE REDUCTION: This exam was performed according to the departmental dose-optimization program which includes automated exposure control, adjustment of the mA and/or kV according to patient size and/or use of iterative reconstruction technique. COMPARISON:  Head CT 04/22/2024. FINDINGS: Brain: Cerebral volume remains normal for age. No midline shift, ventriculomegaly, mass effect, evidence of mass lesion, intracranial hemorrhage or evidence of cortically based acute infarction. Gray-white differentiation is stable and normal for age. Vascular: Calcified atherosclerosis at the skull base. No suspicious intracranial vascular hyperdensity. Skull: Stable and intact. Sinuses/Orbits: Visualized paranasal sinuses and mastoids are stable and well aerated. Other: Right anterior forehead scalp hematoma or contusion is new on series 3, image 40. Underlying right frontal bone intact. Stable and negative orbits. IMPRESSION: 1. Right anterior forehead scalp hematoma  or contusion. No skull intact. 2. Stable and normal for age noncontrast CT appearance of the brain. Electronically Signed   By: Marlise Simpers M.D.   On: 04/24/2024 11:55   CT Head Wo Contrast Result Date: 04/22/2024 CLINICAL DATA:  Dizziness. EXAM: CT HEAD WITHOUT CONTRAST TECHNIQUE: Contiguous axial images were obtained from the base of the skull through the vertex without intravenous contrast. RADIATION DOSE REDUCTION: This exam was performed according to the departmental dose-optimization program which includes automated exposure control, adjustment of the mA and/or kV  according to patient size and/or use of iterative reconstruction technique. COMPARISON:  Right twenty-fifth 2012 FINDINGS: Brain: There is mild cerebral atrophy with widening of the extra-axial spaces and ventricular dilatation. There is no evidence of acute infarction, hemorrhage, hydrocephalus, extra-axial collection or mass lesion/mass effect. Vascular: Moderate to marked severity bilateral cavernous carotid artery calcification is noted. Skull: Normal. Negative for fracture or focal lesion. Sinuses/Orbits: No acute finding. Other: None. IMPRESSION: No acute intracranial abnormality. Electronically Signed   By: Virgle Grime M.D.   On: 04/22/2024 22:27    Procedures Procedures    Medications Ordered in ED Medications  atropine  1 MG/ML injection (  Return to [facility] 04/24/24 1402)  iohexol  (OMNIPAQUE ) 300 MG/ML solution 80 mL (80 mLs Intravenous Contrast Given 04/24/24 1404)    ED Course/ Medical Decision Making/ A&P                                 Medical Decision Making Amount and/or Complexity of Data Reviewed Labs: ordered. Radiology: ordered.  Risk Prescription drug management.   Patient reported syncope.  Recently seen for vertigo.  Has been back renal insufficiency.  Differential diagnosis along with does include causes such arrhythmia plus any injuries that came from the fall.  Will get basic blood work and head CT along cervical spine CT.  Reviewed recent ER note.   Blood work similar to prior.  Called the patient's room for episode of bradycardia with heart rate down to the 30s.  I reviewed the strips and the monitor and it was a sinus rhythm.  Was hypotensive with it.  Was decreased mental status.  However did resolve.  Now back in the 70s with normal blood pressure and mental status back to baseline.  Discussed with Dr. Londa Rival from cardiology.  Patient can either stay up here and he will see her or could go down to [facility] to see electrophysiology per admitting  hospitalist discretion.  Was having abdominal symptoms at the time.  Potentially could be vagal episode.  2 days ago had been treated with possible mycin for possible UTI.  Will recheck urine.  Reportedly has had some difficulty urinating.  On CT appears to have large bladder.  Will get postvoid residual.  Postvoid residual of over 700.  Will place Foley catheter.  CRITICAL CARE Performed by: Mozell Arias Total critical care time: 30 minutes Critical care time was exclusive of separately billable procedures and treating other patients. Critical care was necessary to treat or prevent imminent or life-threatening deterioration. Critical care was time spent personally by me on the following activities: development of treatment plan with patient and/or surrogate as well as nursing, discussions with consultants, evaluation of patient's response to treatment, examination of patient, obtaining history from patient or surrogate, ordering and performing treatments and interventions, ordering and review of laboratory studies, ordering and review of radiographic studies, pulse oximetry and re-evaluation of  patient's condition.. .  Care turned over to Dr. Aldean Amass         Final Clinical Impression(s) / ED Diagnoses Final diagnoses:  Syncope, unspecified syncope type  Urinary retention  Sinus bradycardia    Rx / DC Orders ED Discharge Orders     None         Mozell Arias, MD 04/24/24 1454

## 2024-04-24 NOTE — H&P (Addendum)
 History and Physical    MARLETTE CURVIN WUJ:[id number] DOB: 1932-02-12 DOA: 04/24/2024  PCP: Galvin Jules, FNP   Patient coming from: Home  I have personally briefly reviewed patient's old medical records in [facility]  Chief Complaint: Syncope  HPI: NIYONNA BETSILL is a 89 y.o. female with medical history significant for diabetes mellitus, CKD 3, restless leg syndrome, dyslipidemia.   Patient presented to the ED with reports of passing out.  She reports dizziness of just over a week duration now, present when standing or lying, for which she presented to the ED 5/25, also reported ringing in her ears.  Head CT was unremarkable.  She was given a liter bolus, fosfomycin for suspected UTI, meclizine, and instructions for Epley maneuver. Today she was in the bathroom, she had had a bowel movement, got up and was about to brush her teeth when her vision suddenly turned black and she passed out on the floor, and hitting her head..  This was her first time passing out.  She denies chest pain, palpitations, no difficulty breathing.  She has had good oral intake, no vomiting or diarrhea.  ED Course: Temperature 97.9.  Heart rate 49-92, respiratory rate 15-23, blood pressure systolic 101-164.  In the ED, transient episode of bradycardia with heart rate dropping down to 60, with concurrent hypotension, systolic down to 95A, patient became symptomatic- she tells me she felt like the voices in the room were coming from a distance. This spontaneously resolved-vitals returned to normal without intervention. EKG shows sinus bradycardia with heart rate of 40. CT abdomen and pelvis-negative for acute abnormality.   Head CT- Right anterior forehead scalp hematoma or contusion. No skull intact. Cervical CT negative for acute abnormality. EDP talked to cardiologist Dr. McDowell-recommend admission here at [facility], will admission to [facility] for EP evaluation, hospitalist to decide.  Review of Systems:  As per HPI all other systems reviewed and negative.  Past Medical History:  Diagnosis Date   Arthritis    Chest pain, unspecified    Constipation    DM (diabetes mellitus) (HCC)    Dry eyes    Esophageal stricture 09/2011   schatzki's ring   Fibroids    uterus   Glaucoma    Helicobacter pylori gastritis 2012   Treated   HTN (hypertension)    Hyperlipidemia    Restless legs syndrome (RLS)    Vitamin D  deficiency     Past Surgical History:  Procedure Laterality Date   APPENDECTOMY     BREAST BIOPSY Left 03/20/2018   Procedure: BREAST BIOPSY WITH NEEDLE LOCALIZATION;  Surgeon: Awilda Bogus, MD;  Location: [facility];  Service: General;  Laterality: Left;   BREAST EXCISIONAL BIOPSY Left 2019   Atypical hyperplasia removed   CARDIOVASCULAR STRESS TEST  2009   CATARACT EXTRACTION  2010   right eye   CATARACT EXTRACTION  2012   left eye   COLONOSCOPY  08/2006   Dr. Davonna Estes   COLONOSCOPY  10/13/2011   Normal. Next TCS 09/2016.   Procedure: COLONOSCOPY;  Surgeon: Suzette Espy, MD;  Location: [facility];  Service: Endoscopy;  Laterality: N/A;  8:15   COLONOSCOPY N/A 03/10/2015   RMR: External and anal canal hemorrhoids likely source of hematochezia. Redundant colon. Single sigmoid polyp removed ad described above.    ESOPHAGOGASTRODUODENOSCOPY  06/2008   Dr. Felicie Horning ring s/p disruption, erosive reflux esophagitis, hh.    ESOPHAGOGASTRODUODENOSCOPY  09/2011   Schatki's ring s/p dilation,  multiple 1-65mm antral and bulbar erosions, bx positive for H.Pylori s/ treatment   TUBAL LIGATION       reports that she has never smoked. She has never used smokeless tobacco. She reports that she does not drink alcohol  and does not use drugs.  Allergies  Allergen Reactions   Benzalkonium Other (See Comments)    Unknown    Benzalkonium Chloride Other (See Comments)    Unknown   Metformin And Related Other (See Comments)    Contraindicated due to chronic kidney  disease GFR 30s   Neomy-Bacit-Polymyx-Pramoxine Rash   Neomycin-Bacitracin Zn-Polymyx Rash    "Severe skin rash"   Pravastatin Sodium Other (See Comments)    "Funny feeling and leg pain"   Rosuvastatin Rash    Family History  Problem Relation Age of Onset   Prostate cancer Father    Colon cancer Father        >age 4   Cancer Paternal Grandmother        metastatic at time of diagnosis, primary unknown   Cancer Paternal Aunt        metastatic at time of diagnosisi, primary unknown    Prior to Admission medications   Medication Sig Start Date End Date Taking? Authorizing Provider  aspirin  EC 81 MG tablet Take 81 mg by mouth daily at 2 PM.    [provider]  brimonidine -timolol  (COMBIGAN ) 0.2-0.5 % ophthalmic solution Place 1 drop into both eyes 2 (two) times daily.     [provider]  cholecalciferol (VITAMIN D ) 1000 units tablet Take 1,000 Units by mouth daily.    [provider]  diclofenac  Sodium (VOLTAREN ) 1 % GEL Apply 2 g topically 4 (four) times daily. 02/17/24   Galvin Jules, FNP  empagliflozin  (JARDIANCE ) 10 MG TABS tablet Take 1 tablet (10 mg total) by mouth daily. 01/25/24   Galvin Jules, FNP  enalapril  (VASOTEC ) 10 MG tablet Take 1 tablet (10 mg total) by mouth 2 (two) times daily. 01/25/24 04/24/24  Galvin Jules, FNP  hydrochlorothiazide  (HYDRODIURIL ) 25 MG tablet TAKE ONE TABLET EVERY DAY 03/07/24   Galvin Jules, FNP  latanoprost  (XALATAN ) 0.005 % ophthalmic solution Place 1 drop into both eyes at bedtime.  12/31/14   [provider]  meclizine (ANTIVERT) 25 MG tablet Take 1 tablet (25 mg total) by mouth 3 (three) times daily as needed for dizziness. 04/22/24   Kommor, Madison, MD  Multiple Vitamin (MULTIVITAMIN WITH MINERALS) TABS tablet Take 1 tablet by mouth daily.    [provider]  valACYclovir (VALTREX) 1000 MG tablet Take 1,000 mg by mouth 2 (two) times daily.    [provider]  VYZULTA 0.024 % SOLN Apply 1  drop to eye at bedtime. 04/13/24   [provider]    Physical Exam: Vitals:   04/24/24 1345 04/24/24 1425 04/24/24 1430 04/24/24 1500  BP: (!) 101/53  (!) 140/62 134/67  Pulse: (!) 49  71 75  Resp: 18  15 18   Temp:  97.7 F (36.5 C)    TempSrc:  Oral    SpO2: 99%  98% 98%  Weight:      Height:        Constitutional: NAD, calm, comfortable, small scalp hematoma to right frontal area. Vitals:   04/24/24 1345 04/24/24 1425 04/24/24 1430 04/24/24 1500  BP: (!) 101/53  (!) 140/62 134/67  Pulse: (!) 49  71 75  Resp: 18  15 18   Temp:  97.7 F (36.5 C)  TempSrc:  Oral    SpO2: 99%  98% 98%  Weight:      Height:       Eyes: PERRL, lids and conjunctivae normal ENMT: Mucous membranes are moist. Neck: normal, supple, no masses, no thyromegaly Respiratory: clear to auscultation bilaterally, no wheezing, no crackles. Normal respiratory effort. No accessory muscle use.  Cardiovascular: Regular rate and rhythm, no murmurs / rubs / gallops. No extremity edema.  Extremities warm. Abdomen: no tenderness, no masses palpated. No hepatosplenomegaly. Bowel sounds positive.  Musculoskeletal: no clubbing / cyanosis. No joint deformity upper and lower extremities.  Skin: no rashes, lesions, ulcers. No induration Neurologic: No facial asymmetry, moving EXTR spontaneously, speech fluent.Aaron Aas  Psychiatric: Normal judgment and insight. Alert and oriented x 3. Normal mood.   Labs on Admission: I have personally reviewed following labs and imaging studies  CBC: Recent Labs  Lab 04/22/24 1800 04/24/24 1046  WBC 8.6 8.1  HGB 12.9 13.6  HCT 39.1 40.4  MCV 103.4* 102.3*  PLT 253 250   Basic Metabolic Panel: Recent Labs  Lab 04/22/24 1800 04/24/24 1046  NA 135 138  K 4.8 4.6  CL 101 102  CO2 22 23  GLUCOSE 209* 221*  BUN 47* 37*  CREATININE 1.48* 1.45*  CALCIUM 10.2 10.1   GFR: Estimated Creatinine Clearance: 22.1 mL/min (A) (by C-G formula based on SCr of 1.45 mg/dL  (H)). Liver Function Tests: Recent Labs  Lab 04/22/24 1800  AST 26  ALT 22  ALKPHOS 92  BILITOT 0.8  PROT 7.7  ALBUMIN 3.7   CBG: Recent Labs  Lab 04/24/24 1101 04/24/24 1339  GLUCAP 196* 202*   Urine analysis:    Component Value Date/Time   COLORURINE STRAW (A) 04/24/2024 1603   APPEARANCEUR CLEAR 04/24/2024 1603   APPEARANCEUR Clear 05/26/2023 1441   LABSPEC 1.014 04/24/2024 1603   PHURINE 7.0 04/24/2024 1603   GLUCOSEU >=500 (A) 04/24/2024 1603   HGBUR NEGATIVE 04/24/2024 1603   HGBUR trace-lysed 05/01/2008 0907   BILIRUBINUR NEGATIVE 04/24/2024 1603   BILIRUBINUR Negative 05/26/2023 1441   KETONESUR NEGATIVE 04/24/2024 1603   PROTEINUR NEGATIVE 04/24/2024 1603   UROBILINOGEN 0.2 04/23/2011 1458   NITRITE NEGATIVE 04/24/2024 1603   LEUKOCYTESUR NEGATIVE 04/24/2024 1603    Radiological Exams on Admission: CT ABDOMEN PELVIS W CONTRAST Result Date: 04/24/2024 CLINICAL DATA:  Abdominal pain. EXAM: CT ABDOMEN AND PELVIS WITH CONTRAST TECHNIQUE: Multidetector CT imaging of the abdomen and pelvis was performed using the standard protocol following bolus administration of intravenous contrast. RADIATION DOSE REDUCTION: This exam was performed according to the departmental dose-optimization program which includes automated exposure control, adjustment of the mA and/or kV according to patient size and/or use of iterative reconstruction technique. CONTRAST:  80mL OMNIPAQUE  IOHEXOL  300 MG/ML  SOLN COMPARISON:  None Available. FINDINGS: Lower chest: The visualized lung bases are clear. No intra-abdominal free air or free fluid. Hepatobiliary: The liver is unremarkable. Mild biliary dilatation. A tiny gallstone suspected in the gallbladder fundus. The gallbladder is otherwise unremarkable. Pancreas: Unremarkable. No pancreatic ductal dilatation or surrounding inflammatory changes. Spleen: Normal in size without focal abnormality. Adrenals/Urinary Tract: The adrenal glands are  unremarkable. The kidneys, visualized ureters appear unremarkable. The urinary bladder is distended and grossly unremarkable. Stomach/Bowel: Moderate amount of stool throughout the colon. No bowel obstruction or active inflammation. The appendix is not visualized with certainty. No inflammatory changes identified in the right lower quadrant. Vascular/Lymphatic: Moderate aortoiliac atherosclerotic disease. The IVC is unremarkable. No portal venous gas. There is  no adenopathy. Reproductive: The uterus is anteverted. Uterine fibroid noted. No suspicious adnexal masses. Other: None Musculoskeletal: Osteopenia with degenerative changes of the spine. Severe bilateral hip arthritic changes. No acute osseous pathology. IMPRESSION: 1. No acute intra-abdominal or pelvic pathology. 2. Moderate colonic stool burden. No bowel obstruction. 3.  Aortic Atherosclerosis (ICD10-I70.0). Electronically Signed   By: Angus Bark M.D.   On: 04/24/2024 16:37   DG Chest Portable 1 View Result Date: 04/24/2024 CLINICAL DATA:  syncope EXAM: PORTABLE CHEST - 1 VIEW COMPARISON:  05/12/2016 FINDINGS: Coarse perihilar and infrahilar interstitial markings, slightly decreased. No new infiltrate or overt edema. Heart size and mediastinal contours are within normal limits. Aortic Atherosclerosis (ICD10-170.0). Chronic blunting of the right lateral costophrenic angle. Visualized bones unremarkable. IMPRESSION: Chronic interstitial changes without acute findings. Electronically Signed   By: Nicoletta Barrier M.D.   On: 04/24/2024 13:47   CT Cervical Spine Wo Contrast Result Date: 04/24/2024 CLINICAL DATA:  89 year old female with dizziness, syncope. EXAM: CT CERVICAL SPINE WITHOUT CONTRAST TECHNIQUE: Multidetector CT imaging of the cervical spine was performed without intravenous contrast. Multiplanar CT image reconstructions were also generated. RADIATION DOSE REDUCTION: This exam was performed according to the departmental dose-optimization  program which includes automated exposure control, adjustment of the mA and/or kV according to patient size and/or use of iterative reconstruction technique. COMPARISON:  Head CT today. FINDINGS: Alignment: Mild straightening of cervical lordosis. Cervicothoracic junction alignment is within normal limits. Maintained posterior element alignment. Skull base and vertebrae: Bone mineralization is within normal limits for age. Visualized skull base is intact. No atlanto-occipital dissociation. C1 and C2 appear intact and aligned. No acute osseous abnormality identified. Soft tissues and spinal canal: No prevertebral fluid or swelling. No visible canal hematoma. Negative visible noncontrast neck soft tissues, mild for age calcified carotid atherosclerosis. Disc levels: Degenerative appearing mild anterolisthesis of C4 on C5. Cervical spine degeneration elsewhere is mild for age. Upper chest: Visible upper thoracic levels appear intact. Mild apical lung scarring. IMPRESSION: 1. No acute traumatic injury identified in the cervical spine. 2. Generally mild for age cervical spine degeneration. Electronically Signed   By: Marlise Simpers M.D.   On: 04/24/2024 11:57   CT Head Wo Contrast Result Date: 04/24/2024 CLINICAL DATA:  89 year old female with dizziness, syncope. EXAM: CT HEAD WITHOUT CONTRAST TECHNIQUE: Contiguous axial images were obtained from the base of the skull through the vertex without intravenous contrast. RADIATION DOSE REDUCTION: This exam was performed according to the departmental dose-optimization program which includes automated exposure control, adjustment of the mA and/or kV according to patient size and/or use of iterative reconstruction technique. COMPARISON:  Head CT 04/22/2024. FINDINGS: Brain: Cerebral volume remains normal for age. No midline shift, ventriculomegaly, mass effect, evidence of mass lesion, intracranial hemorrhage or evidence of cortically based acute infarction. Gray-white  differentiation is stable and normal for age. Vascular: Calcified atherosclerosis at the skull base. No suspicious intracranial vascular hyperdensity. Skull: Stable and intact. Sinuses/Orbits: Visualized paranasal sinuses and mastoids are stable and well aerated. Other: Right anterior forehead scalp hematoma or contusion is new on series 3, image 40. Underlying right frontal bone intact. Stable and negative orbits. IMPRESSION: 1. Right anterior forehead scalp hematoma or contusion. No skull intact. 2. Stable and normal for age noncontrast CT appearance of the brain. Electronically Signed   By: Marlise Simpers M.D.   On: 04/24/2024 11:55   CT Head Wo Contrast Result Date: 04/22/2024 CLINICAL DATA:  Dizziness. EXAM: CT HEAD WITHOUT CONTRAST TECHNIQUE: Contiguous axial images were  obtained from the base of the skull through the vertex without intravenous contrast. RADIATION DOSE REDUCTION: This exam was performed according to the departmental dose-optimization program which includes automated exposure control, adjustment of the mA and/or kV according to patient size and/or use of iterative reconstruction technique. COMPARISON:  Right twenty-fifth 2012 FINDINGS: Brain: There is mild cerebral atrophy with widening of the extra-axial spaces and ventricular dilatation. There is no evidence of acute infarction, hemorrhage, hydrocephalus, extra-axial collection or mass lesion/mass effect. Vascular: Moderate to marked severity bilateral cavernous carotid artery calcification is noted. Skull: Normal. Negative for fracture or focal lesion. Sinuses/Orbits: No acute finding. Other: None. IMPRESSION: No acute intracranial abnormality. Electronically Signed   By: Virgle Grime M.D.   On: 04/22/2024 22:27   EKG: Independently reviewed.  EKG obtained at 1336 hrs-  sinus bradycardia, rate 40, QTc 382.  PR interval of 200.  Rhythm regular, no block.  Assessment/Plan Principal Problem:   Syncope Active Problems:   Type 2 diabetes  mellitus (HCC)   Hypertension associated with diabetes (HCC)   CKD stage 3 due to type 2 diabetes mellitus (HCC)   Acute urinary retention   Assessment and Plan: * Syncope 1 syncopal episode today, she had hit her head sustaining frontal scalp hematoma.  In the ED, heart rate transiently dropped to 30, it is dropping blood pressure systolic down to 28U, patient symptomatic.  Heart rate and blood pressure spontaneously improved without intervention.  EKG shows sinus bradycardia rate 40.  She is not on rate limiting drugs. -EDP talked to cardiology-hospitalist to admit here at [facility] or to [facility] for EP evaluation. - Talked to family at bedside- Admit to [facility] -Please consult cardiology at [facility] tomorrow -Troponin 8> 12 - Obtain echocardiogram -Check TSH, magnesium  -N.p.o. midnight pending cardiology eval  Acute urinary retention Showing large bladder.  700 cc postvoid residual in ED. - Foley was placed in ED  CKD stage 3 due to type 2 diabetes mellitus (HCC) Creatinine stable at 1.45.  Baseline CKD stage IIIb. -Resume enalapril , HCTZ  Hypertension associated with diabetes (HCC) Blood pressure stable. - Hold enalapril , hydrochlorothiazide  for contrast exposure - PRN hydralazine  5mg  for systolic > 170  Type 2 diabetes mellitus (HCC) Uncontrolled, A1c 8.2. - SSI- S Q6h - Hold home sitagliptin-metformin   DVT prophylaxis: SCDS for now, pending cardiology eval Code Status:  FULL code-confirmed with patient at bedside Family Communication: Patient's grandson Herman Longs, friend Laverne at bedside, daughter, and family member Gayl Katos on the phone. Disposition Plan: ~ 2 days Consults called: Please consult cardiology in a.m Admission status:  Obs tele   Author: Pati Bonine, MD 04/24/2024 8:01 PM  For on call review [email or web address].

## 2024-04-24 NOTE — ED Notes (Signed)
 A/w [facility] to transport

## 2024-04-24 NOTE — ED Notes (Signed)
 ED TO INPATIENT HANDOFF REPORT  ED Nurse Name and Phone #:   S Name/Age/Gender Candice Brewer 89 y.o. female Room/Bed: APA10/APA10  Code Status   Code Status: Prior  Home/SNF/Other Home Patient oriented to: self, place, time, and situation Is this baseline? Yes   Triage Complete: Triage complete  Chief Complaint Syncope [R55]  Triage Note Pt to er via ems, per ems pt was in the bathroom and was starting to brush her teeth and got dizzy and passed out.  States that pt was recently seen at [facility] and dx with vertigo, pt is awake, and oriented. States that she walks with a walker.  Pt c/o leg pain.    Allergies Allergies  Allergen Reactions   Benzalkonium Other (See Comments)    Unknown    Benzalkonium Chloride Other (See Comments)    Unknown   Metformin And Related Other (See Comments)    Contraindicated due to chronic kidney disease GFR 30s   Neomy-Bacit-Polymyx-Pramoxine Rash   Neomycin-Bacitracin Zn-Polymyx Rash    "Severe skin rash"   Pravastatin Sodium Other (See Comments)    "Funny feeling and leg pain"   Rosuvastatin Rash    Level of Care/Admitting Diagnosis ED Disposition     ED Disposition  Admit   Condition  --   Comment  Hospital Area: [facility] [[id number]]  Level of Care: Telemetry Medical [104]  May place patient in observation at [facility] or [facility] if equivalent level of care is available:: No  Covid Evaluation: Asymptomatic - no recent exposure (last 10 days) testing not required  Diagnosis: Syncope [206001]  Admitting Physician: EMOKPAE, EJIROGHENE E [[id number]]  Attending Physician: EMOKPAE, EJIROGHENE E Evelynn.Filler          B Medical/Surgery History Past Medical History:  Diagnosis Date   Arthritis    Chest pain, unspecified    Constipation    DM (diabetes mellitus) (HCC)    Dry eyes    Esophageal stricture 09/2011   schatzki's ring   Fibroids    uterus   Glaucoma    Helicobacter pylori gastritis 2012   Treated    HTN (hypertension)    Hyperlipidemia    Restless legs syndrome (RLS)    Vitamin D  deficiency    Past Surgical History:  Procedure Laterality Date   APPENDECTOMY     BREAST BIOPSY Left 03/20/2018   Procedure: BREAST BIOPSY WITH NEEDLE LOCALIZATION;  Surgeon: Awilda Bogus, MD;  Location: [facility] ORS;  Service: General;  Laterality: Left;   BREAST EXCISIONAL BIOPSY Left 2019   Atypical hyperplasia removed   CARDIOVASCULAR STRESS TEST  2009   CATARACT EXTRACTION  2010   right eye   CATARACT EXTRACTION  2012   left eye   COLONOSCOPY  08/2006   Dr. Davonna Estes   COLONOSCOPY  10/13/2011   Normal. Next TCS 09/2016.   Procedure: COLONOSCOPY;  Surgeon: Suzette Espy, MD;  Location: [facility] ENDO SUITE;  Service: Endoscopy;  Laterality: N/A;  8:15   COLONOSCOPY N/A 03/10/2015   RMR: External and anal canal hemorrhoids likely source of hematochezia. Redundant colon. Single sigmoid polyp removed ad described above.    ESOPHAGOGASTRODUODENOSCOPY  06/2008   Dr. Felicie Horning ring s/p disruption, erosive reflux esophagitis, hh.    ESOPHAGOGASTRODUODENOSCOPY  09/2011   Schatki's ring s/p dilation, multiple 1-53mm antral and bulbar erosions, bx positive for H.Pylori s/ treatment   TUBAL LIGATION       A IV Location/Drains/Wounds Patient Lines/Drains/Airways Status  Active Line/Drains/Airways     Name Placement date Placement time Site Days   Peripheral IV 04/24/24 20 G Left Antecubital 04/24/24  1426  Antecubital  less than 1            Intake/Output Last 24 hours  Intake/Output Summary (Last 24 hours) at 04/24/2024 1917 Last data filed at 04/24/2024 1849 Gross per 24 hour  Intake --  Output 1100 ml  Net -1100 ml    Labs/Imaging Results for orders placed or performed during the hospital encounter of 04/24/24 (from the past 48 hours)  Troponin I (High Sensitivity)     Status: None   Collection Time: 04/24/24 10:46 AM  Result Value Ref Range   Troponin I (High Sensitivity) 8  <18 ng/L    Comment: (NOTE) Elevated high sensitivity troponin I (hsTnI) values and significant  changes across serial measurements may suggest ACS but many other  chronic and acute conditions are known to elevate hsTnI results.  Refer to the "Links" section for chest pain algorithms and additional  guidance. Performed at [facility], [address], [place], [place] [address]   Basic metabolic panel     Status: Abnormal   Collection Time: 04/24/24 10:46 AM  Result Value Ref Range   Sodium 138 135 - 145 mmol/L   Potassium 4.6 3.5 - 5.1 mmol/L   Chloride 102 98 - 111 mmol/L   CO2 23 22 - 32 mmol/L   Glucose, Bld 221 (H) 70 - 99 mg/dL    Comment: Glucose reference range applies only to samples taken after fasting for at least 8 hours.   BUN 37 (H) 8 - 23 mg/dL   Creatinine, Ser 6.04 (H) 0.44 - 1.00 mg/dL   Calcium 54.0 8.9 - 98.1 mg/dL   GFR, Estimated 34 (L) >60 mL/min    Comment: (NOTE) Calculated using the CKD-EPI Creatinine Equation (2021)    Anion gap 13 5 - 15    Comment: Performed at [facility], [address] [place], [place] [address]  CBC     Status: Abnormal   Collection Time: 04/24/24 10:46 AM  Result Value Ref Range   WBC 8.1 4.0 - 10.5 K/uL   RBC 3.95 3.87 - 5.11 MIL/uL   Hemoglobin 13.6 12.0 - 15.0 g/dL   HCT 82.9 56.2 - 13.0 %   MCV 102.3 (H) 80.0 - 100.0 fL   MCH 34.4 (H) 26.0 - 34.0 pg   MCHC 33.7 30.0 - 36.0 g/dL   RDW 86.5 78.4 - 69.6 %   Platelets 250 150 - 400 K/uL   nRBC 0.0 0.0 - 0.2 %    Comment: Performed at [facility], [address], [place], [place] [address]  CBG monitoring, ED     Status: Abnormal   Collection Time: 04/24/24 11:01 AM  Result Value Ref Range   Glucose-Capillary 196 (H) 70 - 99 mg/dL    Comment: Glucose reference range applies only to samples taken after fasting for at least 8 hours.  Troponin I (High Sensitivity)     Status: None   Collection Time: 04/24/24 12:32 PM  Result Value Ref Range   Troponin I (High Sensitivity)  12 <18 ng/L    Comment: (NOTE) Elevated high sensitivity troponin I (hsTnI) values and significant  changes across serial measurements may suggest ACS but many other  chronic and acute conditions are known to elevate hsTnI results.  Refer to the "Links" section for chest pain algorithms and additional  guidance. Performed at [facility], [address], [place], [place] [address]   CBG monitoring, ED     Status: Abnormal   Collection Time: 04/24/24  1:39 PM  Result Value Ref Range   Glucose-Capillary 202 (H) 70 - 99 mg/dL    Comment: Glucose reference range applies only to samples taken after fasting for at least 8 hours.  Urinalysis, w/ Reflex to Culture (Infection Suspected) -Urine, Unspecified Source     Status: Abnormal   Collection Time: 04/24/24  4:03 PM  Result Value Ref Range   Specimen Source URINE, UNSPE    Color, Urine STRAW (A) YELLOW   APPearance CLEAR CLEAR   Specific Gravity, Urine 1.014 1.005 - 1.030   pH 7.0 5.0 - 8.0   Glucose, UA >=500 (A) NEGATIVE mg/dL   Hgb urine dipstick NEGATIVE NEGATIVE   Bilirubin Urine NEGATIVE NEGATIVE   Ketones, ur NEGATIVE NEGATIVE mg/dL   Protein, ur NEGATIVE NEGATIVE mg/dL   Nitrite NEGATIVE NEGATIVE   Leukocytes,Ua NEGATIVE NEGATIVE   RBC / HPF 0-5 0 - 5 RBC/hpf   WBC, UA 0-5 0 - 5 WBC/hpf    Comment:        Reflex urine culture not performed if WBC <=10, OR if Squamous epithelial cells >5. If Squamous epithelial cells >5 suggest recollection.    Bacteria, UA NONE SEEN NONE SEEN   Squamous Epithelial / HPF 0-5 0 - 5 /HPF    Comment: Performed at [facility], [address], [place], [place] [address]   CT ABDOMEN PELVIS W CONTRAST Result Date: 04/24/2024 CLINICAL DATA:  Abdominal pain. EXAM: CT ABDOMEN AND PELVIS WITH CONTRAST TECHNIQUE: Multidetector CT imaging of the abdomen and pelvis was performed using the standard protocol following bolus administration of intravenous contrast. RADIATION DOSE REDUCTION: This exam was  performed according to the departmental dose-optimization program which includes automated exposure control, adjustment of the mA and/or kV according to patient size and/or use of iterative reconstruction technique. CONTRAST:  80mL OMNIPAQUE IOHEXOL 300 MG/ML  SOLN COMPARISON:  None Available. FINDINGS: Lower chest: The visualized lung bases are clear. No intra-abdominal free air or free fluid. Hepatobiliary: The liver is unremarkable. Mild biliary dilatation. A tiny gallstone suspected in the gallbladder fundus. The gallbladder is otherwise unremarkable. Pancreas: Unremarkable. No pancreatic ductal dilatation or surrounding inflammatory changes. Spleen: Normal in size without focal abnormality. Adrenals/Urinary Tract: The adrenal glands are unremarkable. The kidneys, visualized ureters appear unremarkable. The urinary bladder is distended and grossly unremarkable. Stomach/Bowel: Moderate amount of stool throughout the colon. No bowel obstruction or active inflammation. The appendix is not visualized with certainty. No inflammatory changes identified in the right lower quadrant. Vascular/Lymphatic: Moderate aortoiliac atherosclerotic disease. The IVC is unremarkable. No portal venous gas. There is no adenopathy. Reproductive: The uterus is anteverted. Uterine fibroid noted. No suspicious adnexal masses. Other: None Musculoskeletal: Osteopenia with degenerative changes of the spine. Severe bilateral hip arthritic changes. No acute osseous pathology. IMPRESSION: 1. No acute intra-abdominal or pelvic pathology. 2. Moderate colonic stool burden. No bowel obstruction. 3.  Aortic Atherosclerosis (ICD10-I70.0). Electronically Signed   By: Angus Bark M.D.   On: 04/24/2024 16:37   DG Chest Portable 1 View Result Date: 04/24/2024 CLINICAL DATA:  syncope EXAM: PORTABLE CHEST - 1 VIEW COMPARISON:  05/12/2016 FINDINGS: Coarse perihilar and infrahilar interstitial markings, slightly decreased. No new infiltrate or overt  edema. Heart size and mediastinal contours are within normal limits. Aortic Atherosclerosis (ICD10-170.0). Chronic blunting of the right lateral costophrenic angle. Visualized bones unremarkable. IMPRESSION: Chronic interstitial changes  without acute findings. Electronically Signed   By: Nicoletta Barrier M.D.   On: 04/24/2024 13:47   CT Cervical Spine Wo Contrast Result Date: 04/24/2024 CLINICAL DATA:  89 year old female with dizziness, syncope. EXAM: CT CERVICAL SPINE WITHOUT CONTRAST TECHNIQUE: Multidetector CT imaging of the cervical spine was performed without intravenous contrast. Multiplanar CT image reconstructions were also generated. RADIATION DOSE REDUCTION: This exam was performed according to the departmental dose-optimization program which includes automated exposure control, adjustment of the mA and/or kV according to patient size and/or use of iterative reconstruction technique. COMPARISON:  Head CT today. FINDINGS: Alignment: Mild straightening of cervical lordosis. Cervicothoracic junction alignment is within normal limits. Maintained posterior element alignment. Skull base and vertebrae: Bone mineralization is within normal limits for age. Visualized skull base is intact. No atlanto-occipital dissociation. C1 and C2 appear intact and aligned. No acute osseous abnormality identified. Soft tissues and spinal canal: No prevertebral fluid or swelling. No visible canal hematoma. Negative visible noncontrast neck soft tissues, mild for age calcified carotid atherosclerosis. Disc levels: Degenerative appearing mild anterolisthesis of C4 on C5. Cervical spine degeneration elsewhere is mild for age. Upper chest: Visible upper thoracic levels appear intact. Mild apical lung scarring. IMPRESSION: 1. No acute traumatic injury identified in the cervical spine. 2. Generally mild for age cervical spine degeneration. Electronically Signed   By: Marlise Simpers M.D.   On: 04/24/2024 11:57   CT Head Wo Contrast Result Date:  04/24/2024 CLINICAL DATA:  89 year old female with dizziness, syncope. EXAM: CT HEAD WITHOUT CONTRAST TECHNIQUE: Contiguous axial images were obtained from the base of the skull through the vertex without intravenous contrast. RADIATION DOSE REDUCTION: This exam was performed according to the departmental dose-optimization program which includes automated exposure control, adjustment of the mA and/or kV according to patient size and/or use of iterative reconstruction technique. COMPARISON:  Head CT 04/22/2024. FINDINGS: Brain: Cerebral volume remains normal for age. No midline shift, ventriculomegaly, mass effect, evidence of mass lesion, intracranial hemorrhage or evidence of cortically based acute infarction. Gray-white differentiation is stable and normal for age. Vascular: Calcified atherosclerosis at the skull base. No suspicious intracranial vascular hyperdensity. Skull: Stable and intact. Sinuses/Orbits: Visualized paranasal sinuses and mastoids are stable and well aerated. Other: Right anterior forehead scalp hematoma or contusion is new on series 3, image 40. Underlying right frontal bone intact. Stable and negative orbits. IMPRESSION: 1. Right anterior forehead scalp hematoma or contusion. No skull intact. 2. Stable and normal for age noncontrast CT appearance of the brain. Electronically Signed   By: Marlise Simpers M.D.   On: 04/24/2024 11:55   CT Head Wo Contrast Result Date: 04/22/2024 CLINICAL DATA:  Dizziness. EXAM: CT HEAD WITHOUT CONTRAST TECHNIQUE: Contiguous axial images were obtained from the base of the skull through the vertex without intravenous contrast. RADIATION DOSE REDUCTION: This exam was performed according to the departmental dose-optimization program which includes automated exposure control, adjustment of the mA and/or kV according to patient size and/or use of iterative reconstruction technique. COMPARISON:  Right twenty-fifth 2012 FINDINGS: Brain: There is mild cerebral atrophy with  widening of the extra-axial spaces and ventricular dilatation. There is no evidence of acute infarction, hemorrhage, hydrocephalus, extra-axial collection or mass lesion/mass effect. Vascular: Moderate to marked severity bilateral cavernous carotid artery calcification is noted. Skull: Normal. Negative for fracture or focal lesion. Sinuses/Orbits: No acute finding. Other: None. IMPRESSION: No acute intracranial abnormality. Electronically Signed   By: Virgle Grime M.D.   On: 04/22/2024 22:27    Pending Labs  Unresulted Labs (From admission, onward)    None       Vitals/Pain Today's Vitals   04/24/24 1815 04/24/24 1830 04/24/24 1841 04/24/24 1900  BP: (!) 154/75 (!) 162/73  (!) 143/64  Pulse: 73 76  73  Resp: 18 19  18   Temp:   97.6 F (36.4 C)   TempSrc:   Oral   SpO2: 100% 98%  99%  Weight:      Height:      PainSc:        Isolation Precautions No active isolations  Medications Medications  atropine 1 MG/ML injection (  Return to [facility] 04/24/24 1402)  iohexol (OMNIPAQUE) 300 MG/ML solution 80 mL (80 mLs Intravenous Contrast Given 04/24/24 1404)    Mobility walks with device     Focused Assessments    R Recommendations: See Admitting Provider Note  Report given to:   Additional Notes:

## 2024-04-24 NOTE — ED Notes (Signed)
 Called to give report but receiving nurse was in a meeting a the time.

## 2024-04-24 NOTE — ED Triage Notes (Addendum)
 Pt to er via ems, per ems pt was in the bathroom and was starting to brush her teeth and got dizzy and passed out.  States that pt was recently seen at [facility] and dx with vertigo, pt is awake, and oriented. States that she walks with a walker.  Pt c/o leg pain.

## 2024-04-24 NOTE — Assessment & Plan Note (Addendum)
 Creatinine stable at 1.45.  Baseline CKD stage IIIb. -Resume enalapril , HCTZ

## 2024-04-24 NOTE — Assessment & Plan Note (Signed)
 Showing large bladder.  700 cc postvoid residual in ED. - Foley was placed in ED

## 2024-04-24 NOTE — Assessment & Plan Note (Signed)
 1 syncopal episode today, she had hit her head sustaining frontal scalp hematoma.  In the ED, heart rate transiently dropped to 30, it is dropping blood pressure systolic down to 16X, patient symptomatic.  Heart rate and blood pressure spontaneously improved without intervention.  EKG shows sinus bradycardia rate 40.  She is not on rate limiting drugs. -EDP talked to cardiology-hospitalist to admit here at [facility] or to [facility] for EP evaluation. - Talked to family at bedside- Admit to [facility] -Please consult cardiology at [facility] tomorrow -Troponin 8> 12 - Obtain echocardiogram -Check TSH, magnesium  -N.p.o. midnight pending cardiology eval

## 2024-04-24 NOTE — Assessment & Plan Note (Addendum)
 Uncontrolled, A1c 8.2. - SSI- S Q6h - Hold home sitagliptin-metformin

## 2024-04-24 NOTE — ED Provider Notes (Signed)
 Care transferred to me.  Urine is unremarkable.  CT shows constipation but no other significant findings.  Currently hemodynamically stable.  Discussed with Dr. Quintella Buck for admission.   Jerilynn Montenegro, MD 04/24/24 [phone]

## 2024-04-24 NOTE — Assessment & Plan Note (Addendum)
 Blood pressure stable. - Hold enalapril , hydrochlorothiazide  for contrast exposure - PRN hydralazine  5mg  for systolic > 170

## 2024-04-25 ENCOUNTER — Inpatient Hospital Stay

## 2024-04-25 ENCOUNTER — Telehealth: Payer: Self-pay | Admitting: Pulmonary Disease

## 2024-04-25 ENCOUNTER — Ambulatory Visit: Payer: 59 | Admitting: Family Medicine

## 2024-04-25 DIAGNOSIS — W19XXXA Unspecified fall, initial encounter: Secondary | ICD-10-CM | POA: Diagnosis present

## 2024-04-25 DIAGNOSIS — M1990 Unspecified osteoarthritis, unspecified site: Secondary | ICD-10-CM | POA: Diagnosis present

## 2024-04-25 DIAGNOSIS — E559 Vitamin D deficiency, unspecified: Secondary | ICD-10-CM | POA: Diagnosis present

## 2024-04-25 DIAGNOSIS — Z8042 Family history of malignant neoplasm of prostate: Secondary | ICD-10-CM

## 2024-04-25 DIAGNOSIS — Z800 Family history of malignant neoplasm of digestive organs: Secondary | ICD-10-CM

## 2024-04-25 DIAGNOSIS — I152 Hypertension secondary to endocrine disorders: Secondary | ICD-10-CM | POA: Diagnosis not present

## 2024-04-25 DIAGNOSIS — K5900 Constipation, unspecified: Secondary | ICD-10-CM | POA: Diagnosis present

## 2024-04-25 DIAGNOSIS — R55 Syncope and collapse: Secondary | ICD-10-CM | POA: Diagnosis not present

## 2024-04-25 DIAGNOSIS — R339 Retention of urine, unspecified: Secondary | ICD-10-CM | POA: Diagnosis present

## 2024-04-25 DIAGNOSIS — H409 Unspecified glaucoma: Secondary | ICD-10-CM | POA: Diagnosis present

## 2024-04-25 DIAGNOSIS — E1159 Type 2 diabetes mellitus with other circulatory complications: Secondary | ICD-10-CM | POA: Diagnosis present

## 2024-04-25 DIAGNOSIS — E1169 Type 2 diabetes mellitus with other specified complication: Secondary | ICD-10-CM | POA: Diagnosis not present

## 2024-04-25 DIAGNOSIS — Z881 Allergy status to other antibiotic agents status: Secondary | ICD-10-CM

## 2024-04-25 DIAGNOSIS — Y92009 Unspecified place in unspecified non-institutional (private) residence as the place of occurrence of the external cause: Secondary | ICD-10-CM

## 2024-04-25 DIAGNOSIS — Z888 Allergy status to other drugs, medicaments and biological substances status: Secondary | ICD-10-CM | POA: Diagnosis not present

## 2024-04-25 DIAGNOSIS — Z7984 Long term (current) use of oral hypoglycemic drugs: Secondary | ICD-10-CM | POA: Diagnosis not present

## 2024-04-25 DIAGNOSIS — Z7982 Long term (current) use of aspirin: Secondary | ICD-10-CM | POA: Diagnosis not present

## 2024-04-25 DIAGNOSIS — Z79899 Other long term (current) drug therapy: Secondary | ICD-10-CM | POA: Diagnosis not present

## 2024-04-25 DIAGNOSIS — N183 Chronic kidney disease, stage 3 unspecified: Secondary | ICD-10-CM | POA: Diagnosis not present

## 2024-04-25 DIAGNOSIS — E1122 Type 2 diabetes mellitus with diabetic chronic kidney disease: Secondary | ICD-10-CM | POA: Diagnosis present

## 2024-04-25 DIAGNOSIS — I951 Orthostatic hypotension: Secondary | ICD-10-CM | POA: Diagnosis present

## 2024-04-25 DIAGNOSIS — H5500 Unspecified nystagmus: Secondary | ICD-10-CM | POA: Diagnosis present

## 2024-04-25 DIAGNOSIS — R338 Other retention of urine: Secondary | ICD-10-CM | POA: Diagnosis not present

## 2024-04-25 DIAGNOSIS — R42 Dizziness and giddiness: Secondary | ICD-10-CM | POA: Diagnosis not present

## 2024-04-25 DIAGNOSIS — R001 Bradycardia, unspecified: Secondary | ICD-10-CM

## 2024-04-25 DIAGNOSIS — G2581 Restless legs syndrome: Secondary | ICD-10-CM | POA: Diagnosis present

## 2024-04-25 DIAGNOSIS — N1832 Chronic kidney disease, stage 3b: Secondary | ICD-10-CM | POA: Diagnosis present

## 2024-04-25 DIAGNOSIS — E785 Hyperlipidemia, unspecified: Secondary | ICD-10-CM | POA: Diagnosis present

## 2024-04-25 DIAGNOSIS — H8110 Benign paroxysmal vertigo, unspecified ear: Secondary | ICD-10-CM | POA: Diagnosis present

## 2024-04-25 DIAGNOSIS — S0003XA Contusion of scalp, initial encounter: Secondary | ICD-10-CM | POA: Diagnosis present

## 2024-04-25 DIAGNOSIS — Z860100 Personal history of colon polyps, unspecified: Secondary | ICD-10-CM | POA: Diagnosis not present

## 2024-04-25 LAB — CBC
HCT: 35.4 % — ABNORMAL LOW (ref 36.0–46.0)
Hemoglobin: 11.8 g/dL — ABNORMAL LOW (ref 12.0–15.0)
MCH: 33.9 pg (ref 26.0–34.0)
MCHC: 33.3 g/dL (ref 30.0–36.0)
MCV: 101.7 fL — ABNORMAL HIGH (ref 80.0–100.0)
Platelets: 219 10*3/uL (ref 150–400)
RBC: 3.48 MIL/uL — ABNORMAL LOW (ref 3.87–5.11)
RDW: 13.4 % (ref 11.5–15.5)
WBC: 8.6 10*3/uL (ref 4.0–10.5)
nRBC: 0 % (ref 0.0–0.2)

## 2024-04-25 LAB — BASIC METABOLIC PANEL WITH GFR
Anion gap: 9 (ref 5–15)
BUN: 31 mg/dL — ABNORMAL HIGH (ref 8–23)
CO2: 24 mmol/L (ref 22–32)
Calcium: 9.8 mg/dL (ref 8.9–10.3)
Chloride: 108 mmol/L (ref 98–111)
Creatinine, Ser: 1.39 mg/dL — ABNORMAL HIGH (ref 0.44–1.00)
GFR, Estimated: 36 mL/min — ABNORMAL LOW (ref >60)
Glucose, Bld: 136 mg/dL — ABNORMAL HIGH (ref 70–99)
Potassium: 3.6 mmol/L (ref 3.5–5.1)
Sodium: 141 mmol/L (ref 135–145)

## 2024-04-25 LAB — GLUCOSE, CAPILLARY
Glucose-Capillary: 124 mg/dL — ABNORMAL HIGH (ref 70–99)
Glucose-Capillary: 141 mg/dL — ABNORMAL HIGH (ref 70–99)
Glucose-Capillary: 176 mg/dL — ABNORMAL HIGH (ref 70–99)
Glucose-Capillary: 292 mg/dL — ABNORMAL HIGH (ref 70–99)

## 2024-04-25 LAB — MRSA NEXT GEN BY PCR, NASAL: MRSA by PCR Next Gen: NOT DETECTED

## 2024-04-25 MED ORDER — ARTIFICIAL TEARS OPHTHALMIC OINT
1.0000 | TOPICAL_OINTMENT | Freq: Every day | OPHTHALMIC | Status: DC
  Administered 2024-04-25: 1 via OPHTHALMIC
  Filled 2024-04-25: qty 3.5

## 2024-04-25 MED ORDER — EMPAGLIFLOZIN 10 MG PO TABS
10.0000 mg | ORAL_TABLET | Freq: Every day | ORAL | Status: DC
  Administered 2024-04-25 – 2024-04-26 (×2): 10 mg via ORAL
  Filled 2024-04-25 (×3): qty 1

## 2024-04-25 MED ORDER — MECLIZINE HCL 25 MG PO TABS
25.0000 mg | ORAL_TABLET | Freq: Three times a day (TID) | ORAL | Status: DC | PRN
  Administered 2024-04-26: 25 mg via ORAL
  Filled 2024-04-25 (×2): qty 1

## 2024-04-25 MED ORDER — CHLORHEXIDINE GLUCONATE CLOTH 2 % EX PADS
6.0000 | MEDICATED_PAD | Freq: Every day | CUTANEOUS | Status: DC
  Administered 2024-04-25: 6 via TOPICAL

## 2024-04-25 MED ORDER — INSULIN ASPART 100 UNIT/ML IJ SOLN
0.0000 [IU] | Freq: Three times a day (TID) | INTRAMUSCULAR | Status: DC
  Administered 2024-04-25 – 2024-04-26 (×2): 1 [IU] via SUBCUTANEOUS
  Administered 2024-04-26: 2 [IU] via SUBCUTANEOUS

## 2024-04-25 NOTE — Progress Notes (Unsigned)
 Enrolled for [employer] to mail a ZIO XT long term holter monitor to the patients address on file. Requested delivery 05/02/24.  Dr. Lawana Pray to read.

## 2024-04-25 NOTE — Consult Note (Addendum)
 ELECTROPHYSIOLOGY CONSULT NOTE    Patient ID: Candice Brewer MRN: [id number], DOB/AGE: 12-14-1931 89 y.o.  Admit date: 04/24/2024 Date of Consult: 04/25/2024  Primary Physician: Galvin Jules, FNP Primary Cardiologist: None  Electrophysiologist: Dr. Lawana Pray, new consult 04/25/24  Referring Provider: Dr. Sundra Engel  Patient Profile: Candice Brewer is a 89 y.o. female with a history of HLD, RLS, DM, CKD III who is being seen today for the evaluation of syncope at the request of Dr. Sundra Engel.  HPI:  Candice Brewer is a 89 y.o. female who presented to [facility] ER on 04/24/24 with reports of syncope.    She lives with her daughter but remains largely independent. She continues to drive.  Her daughter reports in the recent few months she has had episodes of tripping and falling  > at least three different episodes.  The patient reports she has passed out in remote past but nothing in the last 3 years.  She reports she went to the restroom, used the bathroom / stood up and was preparing to brush her teeth when her vision turned black.  The next thing she remembers is her grandson's voice and him calling 911. On ER arrival, she had sinus bradycardia 38-40's and was hypotensive with systolic in 60's. She was given IVF's.  She had a second episode of feeling like she might pass out in the ER but it resolved spontaneously.  HR / BP improved with IVF. She struck her head when she fell at home.  CT head showed right anterior forehead scalp hematoma but no fracture, CT cervical spine negative. Of note, she was also found to have acute urinary retention in the ER and required catheter. Labs wnl for patient, troponin 8 > 12, Hgb 11.8.    She denies chest pain, palpitations, dyspnea, PND, orthopnea, nausea, vomiting, dizziness, syncope, edema, weight gain, or early satiety.   Labs Potassium3.6 (05/28 0557) Magnesium   1.9 (05/27 1232) Creatinine, ser  1.39* (05/28 0557) PLT  219 (05/28 0557) HGB  11.8*  (05/28 0557) WBC 8.6 (05/28 0557) Troponin I (High Sensitivity)12 (05/27 1232).    Past Medical History:  Diagnosis Date   Arthritis    Chest pain, unspecified    Constipation    DM (diabetes mellitus) (HCC)    Dry eyes    Esophageal stricture 09/2011   schatzki's ring   Fibroids    uterus   Glaucoma    Helicobacter pylori gastritis 2012   Treated   HTN (hypertension)    Hyperlipidemia    Restless legs syndrome (RLS)    Vitamin D  deficiency      Surgical History:  Past Surgical History:  Procedure Laterality Date   APPENDECTOMY     BREAST BIOPSY Left 03/20/2018   Procedure: BREAST BIOPSY WITH NEEDLE LOCALIZATION;  Surgeon: Awilda Bogus, MD;  Location: [facility] ORS;  Service: General;  Laterality: Left;   BREAST EXCISIONAL BIOPSY Left 2019   Atypical hyperplasia removed   CARDIOVASCULAR STRESS TEST  2009   CATARACT EXTRACTION  2010   right eye   CATARACT EXTRACTION  2012   left eye   COLONOSCOPY  08/2006   Dr. Davonna Estes   COLONOSCOPY  10/13/2011   Normal. Next TCS 09/2016.   Procedure: COLONOSCOPY;  Surgeon: Suzette Espy, MD;  Location: [facility] SUITE;  Service: Endoscopy;  Laterality: N/A;  8:15   COLONOSCOPY N/A 03/10/2015   RMR: External and anal canal hemorrhoids likely source of hematochezia. Redundant colon. Single sigmoid polyp  removed ad described above.    ESOPHAGOGASTRODUODENOSCOPY  06/2008   Dr. Felicie Horning ring s/p disruption, erosive reflux esophagitis, hh.    ESOPHAGOGASTRODUODENOSCOPY  09/2011   Schatki's ring s/p dilation, multiple 1-69mm antral and bulbar erosions, bx positive for H.Pylori s/ treatment   TUBAL LIGATION       Medications Prior to Admission  Medication Sig Dispense Refill Last Dose/Taking   aspirin  EC 81 MG tablet Take 81 mg by mouth daily.   04/23/2024 at  9:00 AM   brimonidine -timolol  (COMBIGAN ) 0.2-0.5 % ophthalmic solution Place 1 drop into both eyes 2 (two) times daily.    04/23/2024 Bedtime   cholecalciferol (VITAMIN D )  1000 units tablet Take 1,000 Units by mouth daily.   04/23/2024 Morning   empagliflozin  (JARDIANCE ) 10 MG TABS tablet Take 10 mg by mouth daily.   Past Week   enalapril  (VASOTEC ) 10 MG tablet Take 1 tablet (10 mg total) by mouth 2 (two) times daily. 180 tablet 1 04/23/2024 Bedtime   hydrochlorothiazide  (HYDRODIURIL ) 25 MG tablet TAKE ONE TABLET EVERY DAY 90 tablet 0 04/23/2024 Morning   meclizine (ANTIVERT) 25 MG tablet Take 1 tablet (25 mg total) by mouth 3 (three) times daily as needed for dizziness. 30 tablet 0 04/23/2024 Morning   Multiple Vitamin (MULTIVITAMIN WITH MINERALS) TABS tablet Take 1 tablet by mouth daily.   04/23/2024 Morning   OVER THE COUNTER MEDICATION Apply 1 Application topically as needed (pain). "Amish gel"   04/23/2024 Bedtime   Polyethyl Glycol-Propyl Glycol (SYSTANE) 0.4-0.3 % GEL ophthalmic gel Place 1 Application into both eyes at bedtime.   04/23/2024 Bedtime   Polyethyl Glycol-Propyl Glycol (SYSTANE) 0.4-0.3 % SOLN Place 1 drop into both eyes as needed (dry eyes).   04/24/2024 Morning   valACYclovir (VALTREX) 1000 MG tablet Take 1,000 mg by mouth 2 (two) times daily.   04/23/2024 Bedtime   VYZULTA 0.024 % SOLN Apply 1 drop to eye at bedtime.   04/23/2024 Bedtime    Inpatient Medications:   artificial tears  1 Application Both Eyes QHS   aspirin  EC  81 mg Oral Daily   brimonidine   1 drop Both Eyes BID   And   timolol   1 drop Both Eyes BID   Chlorhexidine  Gluconate Cloth  6 each Topical Daily   empagliflozin   10 mg Oral Daily   insulin  aspart  0-9 Units Subcutaneous Q6H   latanoprost   1 drop Both Eyes QHS   valACYclovir  1,000 mg Oral BID    Allergies:  Allergies  Allergen Reactions   Benzalkonium Other (See Comments)    Unknown    Benzalkonium Chloride Other (See Comments)    Unknown   Metformin And Related Other (See Comments)    Contraindicated due to chronic kidney disease GFR 30s   Neomy-Bacit-Polymyx-Pramoxine Rash   Neomycin-Bacitracin Zn-Polymyx Rash     "Severe skin rash"   Pravastatin Sodium Other (See Comments)    "Funny feeling and leg pain"   Rosuvastatin Rash    Family History  Problem Relation Age of Onset   Prostate cancer Father    Colon cancer Father        >age 3   Cancer Paternal Grandmother        metastatic at time of diagnosis, primary unknown   Cancer Paternal Aunt        metastatic at time of diagnosisi, primary unknown     Physical Exam: Vitals:   04/25/24 0009 04/25/24 0428 04/25/24 0755 04/25/24 1317  BP: (!) 126/58  139/61 (!) 148/68 (!) 116/55  Pulse: 73 73 66 72  Resp: 16 16 15 17   Temp: 97.9 F (36.6 C) 98.1 F (36.7 C) 97.8 F (36.6 C) 97.9 F (36.6 C)  TempSrc: Oral Oral Oral Oral  SpO2: 100% 99% 98% 99%  Weight:      Height:        GEN- elderly female eating lunch in NAD, A&O x 3, normal affect HEENT: Normocephalic, atraumatic Lungs- CTAB, Normal effort.  Heart- Regular rate and rhythm, No M/G/R.  GI- Soft, NT, ND.  Extremities- No clubbing, cyanosis, or edema   Radiology/Studies: CT ABDOMEN PELVIS W CONTRAST Result Date: 04/24/2024 CLINICAL DATA:  Abdominal pain. EXAM: CT ABDOMEN AND PELVIS WITH CONTRAST TECHNIQUE: Multidetector CT imaging of the abdomen and pelvis was performed using the standard protocol following bolus administration of intravenous contrast. RADIATION DOSE REDUCTION: This exam was performed according to the departmental dose-optimization program which includes automated exposure control, adjustment of the mA and/or kV according to patient size and/or use of iterative reconstruction technique. CONTRAST:  80mL OMNIPAQUE IOHEXOL 300 MG/ML  SOLN COMPARISON:  None Available. FINDINGS: Lower chest: The visualized lung bases are clear. No intra-abdominal free air or free fluid. Hepatobiliary: The liver is unremarkable. Mild biliary dilatation. A tiny gallstone suspected in the gallbladder fundus. The gallbladder is otherwise unremarkable. Pancreas: Unremarkable. No pancreatic ductal  dilatation or surrounding inflammatory changes. Spleen: Normal in size without focal abnormality. Adrenals/Urinary Tract: The adrenal glands are unremarkable. The kidneys, visualized ureters appear unremarkable. The urinary bladder is distended and grossly unremarkable. Stomach/Bowel: Moderate amount of stool throughout the colon. No bowel obstruction or active inflammation. The appendix is not visualized with certainty. No inflammatory changes identified in the right lower quadrant. Vascular/Lymphatic: Moderate aortoiliac atherosclerotic disease. The IVC is unremarkable. No portal venous gas. There is no adenopathy. Reproductive: The uterus is anteverted. Uterine fibroid noted. No suspicious adnexal masses. Other: None Musculoskeletal: Osteopenia with degenerative changes of the spine. Severe bilateral hip arthritic changes. No acute osseous pathology. IMPRESSION: 1. No acute intra-abdominal or pelvic pathology. 2. Moderate colonic stool burden. No bowel obstruction. 3.  Aortic Atherosclerosis (ICD10-I70.0). Electronically Signed   By: Angus Bark M.D.   On: 04/24/2024 16:37   DG Chest Portable 1 View Result Date: 04/24/2024 CLINICAL DATA:  syncope EXAM: PORTABLE CHEST - 1 VIEW COMPARISON:  05/12/2016 FINDINGS: Coarse perihilar and infrahilar interstitial markings, slightly decreased. No new infiltrate or overt edema. Heart size and mediastinal contours are within normal limits. Aortic Atherosclerosis (ICD10-170.0). Chronic blunting of the right lateral costophrenic angle. Visualized bones unremarkable. IMPRESSION: Chronic interstitial changes without acute findings. Electronically Signed   By: Nicoletta Barrier M.D.   On: 04/24/2024 13:47   CT Cervical Spine Wo Contrast Result Date: 04/24/2024 CLINICAL DATA:  89 year old female with dizziness, syncope. EXAM: CT CERVICAL SPINE WITHOUT CONTRAST TECHNIQUE: Multidetector CT imaging of the cervical spine was performed without intravenous contrast. Multiplanar CT  image reconstructions were also generated. RADIATION DOSE REDUCTION: This exam was performed according to the departmental dose-optimization program which includes automated exposure control, adjustment of the mA and/or kV according to patient size and/or use of iterative reconstruction technique. COMPARISON:  Head CT today. FINDINGS: Alignment: Mild straightening of cervical lordosis. Cervicothoracic junction alignment is within normal limits. Maintained posterior element alignment. Skull base and vertebrae: Bone mineralization is within normal limits for age. Visualized skull base is intact. No atlanto-occipital dissociation. C1 and C2 appear intact and aligned. No acute osseous abnormality identified. Soft tissues and  spinal canal: No prevertebral fluid or swelling. No visible canal hematoma. Negative visible noncontrast neck soft tissues, mild for age calcified carotid atherosclerosis. Disc levels: Degenerative appearing mild anterolisthesis of C4 on C5. Cervical spine degeneration elsewhere is mild for age. Upper chest: Visible upper thoracic levels appear intact. Mild apical lung scarring. IMPRESSION: 1. No acute traumatic injury identified in the cervical spine. 2. Generally mild for age cervical spine degeneration. Electronically Signed   By: Marlise Simpers M.D.   On: 04/24/2024 11:57   CT Head Wo Contrast Result Date: 04/24/2024 CLINICAL DATA:  89 year old female with dizziness, syncope. EXAM: CT HEAD WITHOUT CONTRAST TECHNIQUE: Contiguous axial images were obtained from the base of the skull through the vertex without intravenous contrast. RADIATION DOSE REDUCTION: This exam was performed according to the departmental dose-optimization program which includes automated exposure control, adjustment of the mA and/or kV according to patient size and/or use of iterative reconstruction technique. COMPARISON:  Head CT 04/22/2024. FINDINGS: Brain: Cerebral volume remains normal for age. No midline shift,  ventriculomegaly, mass effect, evidence of mass lesion, intracranial hemorrhage or evidence of cortically based acute infarction. Gray-white differentiation is stable and normal for age. Vascular: Calcified atherosclerosis at the skull base. No suspicious intracranial vascular hyperdensity. Skull: Stable and intact. Sinuses/Orbits: Visualized paranasal sinuses and mastoids are stable and well aerated. Other: Right anterior forehead scalp hematoma or contusion is new on series 3, image 40. Underlying right frontal bone intact. Stable and negative orbits. IMPRESSION: 1. Right anterior forehead scalp hematoma or contusion. No skull intact. 2. Stable and normal for age noncontrast CT appearance of the brain. Electronically Signed   By: Marlise Simpers M.D.   On: 04/24/2024 11:55   CT Head Wo Contrast Result Date: 04/22/2024 CLINICAL DATA:  Dizziness. EXAM: CT HEAD WITHOUT CONTRAST TECHNIQUE: Contiguous axial images were obtained from the base of the skull through the vertex without intravenous contrast. RADIATION DOSE REDUCTION: This exam was performed according to the departmental dose-optimization program which includes automated exposure control, adjustment of the mA and/or kV according to patient size and/or use of iterative reconstruction technique. COMPARISON:  Right twenty-fifth 2012 FINDINGS: Brain: There is mild cerebral atrophy with widening of the extra-axial spaces and ventricular dilatation. There is no evidence of acute infarction, hemorrhage, hydrocephalus, extra-axial collection or mass lesion/mass effect. Vascular: Moderate to marked severity bilateral cavernous carotid artery calcification is noted. Skull: Normal. Negative for fracture or focal lesion. Sinuses/Orbits: No acute finding. Other: None. IMPRESSION: No acute intracranial abnormality. Electronically Signed   By: Virgle Grime M.D.   On: 04/22/2024 22:27    EKG: SB 40 bpm (personally reviewed)  TELEMETRY: initial ER tele notable for SB  38-40's lasting ~ 30 minutes, t then increased to 60-80's with no further brady episodes (personally reviewed)  DEVICE HISTORY: n/a  Assessment/Plan:  Syncope  Bradycardia  Suspected Orthostatic Hypotension (pre-admit) Suspected orthostatic hypotension with event in restroom prior to admit leading to syncopal episode, +/- micturition syncope. Tele review shows no further episodes of bradycardia after IVF's   -avoid AV nodal blockers  -Ojas Coone plan for 2 week ZIO monitor as outpatient  -encourage adequate hydration -consider compression socks  -caution with position changes  -no driving until Zio patch can be reviewed, discussed with patient and daughter  -plan for EP follow up in one month to allow for monitor review       For questions or updates, please contact [facility] Please consult [email or web address] for contact info under Cardiology/STEMI.  Signed, Creighton Doffing,  NP-C, AGACNP-BC [facility] - Electrophysiology  04/25/2024, 2:50 PM  I have seen and examined this patient with Creighton Doffing.  Agree with above, note added to reflect my findings.  Patient with a past medical history as above.  She presented to the emergency room 04/24/2024 with reports of syncope.  She used the bathroom.  She stood up off the toilet and was going to brush her teeth.  She did not make it to brushing her teeth and had an episode of syncope.  She states that her vision turned black, almost like a shade coming down over her eyes.  Her grandson found her and called 911.  On arrival to the emergency room, she was bradycardic with heart rates in the high 30s to low 40s.  She was also hypotensive.  She was given IV fluids.  She has had an improvement in her heart rate and blood pressure.  Per the patient and her daughter, she has had an episode of syncope, but this was many years ago.  She does get dizzy when moving her head back-and-forth.  She has been started on meclizine with improvement in her  dizziness.  GEN: No acute distress.   Neck: No JVD Cardiac: RRR, no murmurs, rubs, or gallops.  Respiratory: normal BS bases bilaterally. GI: Soft, nontender, non-distended  MS: No edema; No deformity. Neuro:  Nonfocal  Skin: warm and dry Psych: Normal affect    Syncope: Appears orthostatic in nature.  Somewhat consistent with micturition syncope.  Telemetry is without episodes of bradycardia aside from when she initially arrived in the emergency room.  Agree with holding AV nodal blockers.  Encourage adequate hydration and consider compression stockings.  Tausha Milhoan arrange for a 2-week monitor as an outpatient and can follow-up in EP clinic.  We Jasmen Emrich arrange follow-up.  EP to sign off.  Liridona Mashaw M. Felton Buczynski MD 04/25/2024 4:17 PM

## 2024-04-25 NOTE — Progress Notes (Signed)
 Obtained orthostatics. While transitioning from lying to sitting, and sitting to standing, pt verbalized "room spinning"  dizziness. Unable to tolerate standing for 3 minutes at this time.

## 2024-04-25 NOTE — Telephone Encounter (Signed)
 Seen in hospital post syncopal episode.  Brady/hypotensive on admit. None since.  Plan for cardiac monitor for 14 days.  Dr. Lawana Pray to read.      Creighton Doffing, NP-C, AGACNP-BC [facility] - Electrophysiology  04/25/2024, 3:10 PM

## 2024-04-25 NOTE — Plan of Care (Signed)

## 2024-04-25 NOTE — Progress Notes (Signed)
 Progress Note   Patient: Candice Brewer NWG:[id number] DOB: 1931/12/12 DOA: 04/24/2024     0 DOS: the patient was seen and examined on 04/25/2024   Brief hospital course: Candice Brewer is a 89 y.o. female with medical history significant for diabetes mellitus, CKD 3, restless leg syndrome, dyslipidemia.   Patient presented to the ED with reports of passing out.  She reports dizziness of just over a week duration now, present when standing or lying, for which she presented to the ED 5/25, also reported ringing in her ears.  Head CT was unremarkable.  She was given a liter bolus, fosfomycin for suspected UTI, meclizine, and instructions for Epley maneuver. She is back to ED yesterday to [facility], where her HR is lower side transferred to [facility] for cardiology evaaluation.  Assessment and Plan: * Syncope Sinus bradycardia Her symptoms are mostly benign positional vertigo.  But given her bradycardia with symptoms,  HR transiently dropped to 30 along with BP. She is transferred to [facility] for Cardiology consultation. Continue telemetry monitoring. Echocardiogram ordered. TSH, magnesium  WNL. Check orthostatic vitals. Continue meclizine PRN. PT/ OT evaluation.  Acute urinary retention 700 cc postvoid residual in ED. Continue foley care. Voiding trial once she is more ambulatory.  CKD stage 3 due to type 2 diabetes mellitus (HCC) Creatinine stable at 1.45.  Baseline CKD stage IIIb. Avoid nephrotoxic drugs. Monitor daily renal function.  Hypertension associated with diabetes (HCC) Hold enalapril , hydrochlorothiazide  for contrast exposure PRN hydralazine  5mg  for systolic > 170  Type 2 diabetes mellitus (HCC) A1c 8.2. Continue accucheks, sliding scale insulin . Hold home sitagliptin-metformin     Out of bed to chair. Incentive spirometry. Nursing supportive care. Fall, aspiration precautions. Diet:  Diet Orders (From admission, onward)     Start     Ordered   04/25/24 1046  Diet  heart healthy/carb modified Fluid consistency: Thin  Diet effective now       Question:  Fluid consistency:  Answer:  Thin   04/25/24 1045           DVT prophylaxis: SCDs Start: 04/24/24 2121  Level of care: Telemetry Cardiac   Code Status: Full Code  Subjective: Patient is seen and examined today morning. She is lying in bed, feels lightheaded. Feels room spinning. No headache or chest pain.   Physical Exam: Vitals:   04/25/24 0009 04/25/24 0428 04/25/24 0755 04/25/24 1317  BP: (!) 126/58 139/61 (!) 148/68 (!) 116/55  Pulse: 73 73 66 72  Resp: 16 16 15 17   Temp: 97.9 F (36.6 C) 98.1 F (36.7 C) 97.8 F (36.6 C) 97.9 F (36.6 C)  TempSrc: Oral Oral Oral Oral  SpO2: 100% 99% 98% 99%  Weight:      Height:        General - Elderly thin built [place] American female, distress due to being dizzy. HEENT - PERRLA, EOMI, atraumatic head, non tender sinuses. Lung - Clear, basal rales, rhonchi, wheezes. Heart - S1, S2 heard, no murmurs, rubs, no pedal edema. Abdomen - Soft, non tender, bowel sounds good Neuro - Alert, awake and oriented, non focal exam. Skin - Warm and dry.  Data Reviewed:      Latest Ref Rng & Units 04/25/2024    5:57 AM 04/24/2024   10:46 AM 04/22/2024    6:00 PM  CBC  WBC 4.0 - 10.5 K/uL 8.6  8.1  8.6   Hemoglobin 12.0 - 15.0 g/dL 57.8  46.9  62.9   Hematocrit 36.0 -  46.0 % 35.4  40.4  39.1   Platelets 150 - 400 K/uL 219  250  253       Latest Ref Rng & Units 04/25/2024    5:57 AM 04/24/2024   10:46 AM 04/22/2024    6:00 PM  BMP  Glucose 70 - 99 mg/dL 161  096  045   BUN 8 - 23 mg/dL 31  37  47   Creatinine 0.44 - 1.00 mg/dL 4.09  8.11  9.14   Sodium 135 - 145 mmol/L 141  138  135   Potassium 3.5 - 5.1 mmol/L 3.6  4.6  4.8   Chloride 98 - 111 mmol/L 108  102  101   CO2 22 - 32 mmol/L 24  23  22    Calcium 8.9 - 10.3 mg/dL 9.8  78.2  95.6    CT ABDOMEN PELVIS W CONTRAST Result Date: 04/24/2024 CLINICAL DATA:  Abdominal pain. EXAM: CT ABDOMEN  AND PELVIS WITH CONTRAST TECHNIQUE: Multidetector CT imaging of the abdomen and pelvis was performed using the standard protocol following bolus administration of intravenous contrast. RADIATION DOSE REDUCTION: This exam was performed according to the departmental dose-optimization program which includes automated exposure control, adjustment of the mA and/or kV according to patient size and/or use of iterative reconstruction technique. CONTRAST:  80mL OMNIPAQUE IOHEXOL 300 MG/ML  SOLN COMPARISON:  None Available. FINDINGS: Lower chest: The visualized lung bases are clear. No intra-abdominal free air or free fluid. Hepatobiliary: The liver is unremarkable. Mild biliary dilatation. A tiny gallstone suspected in the gallbladder fundus. The gallbladder is otherwise unremarkable. Pancreas: Unremarkable. No pancreatic ductal dilatation or surrounding inflammatory changes. Spleen: Normal in size without focal abnormality. Adrenals/Urinary Tract: The adrenal glands are unremarkable. The kidneys, visualized ureters appear unremarkable. The urinary bladder is distended and grossly unremarkable. Stomach/Bowel: Moderate amount of stool throughout the colon. No bowel obstruction or active inflammation. The appendix is not visualized with certainty. No inflammatory changes identified in the right lower quadrant. Vascular/Lymphatic: Moderate aortoiliac atherosclerotic disease. The IVC is unremarkable. No portal venous gas. There is no adenopathy. Reproductive: The uterus is anteverted. Uterine fibroid noted. No suspicious adnexal masses. Other: None Musculoskeletal: Osteopenia with degenerative changes of the spine. Severe bilateral hip arthritic changes. No acute osseous pathology. IMPRESSION: 1. No acute intra-abdominal or pelvic pathology. 2. Moderate colonic stool burden. No bowel obstruction. 3.  Aortic Atherosclerosis (ICD10-I70.0). Electronically Signed   By: Angus Bark M.D.   On: 04/24/2024 16:37   DG Chest  Portable 1 View Result Date: 04/24/2024 CLINICAL DATA:  syncope EXAM: PORTABLE CHEST - 1 VIEW COMPARISON:  05/12/2016 FINDINGS: Coarse perihilar and infrahilar interstitial markings, slightly decreased. No new infiltrate or overt edema. Heart size and mediastinal contours are within normal limits. Aortic Atherosclerosis (ICD10-170.0). Chronic blunting of the right lateral costophrenic angle. Visualized bones unremarkable. IMPRESSION: Chronic interstitial changes without acute findings. Electronically Signed   By: Nicoletta Barrier M.D.   On: 04/24/2024 13:47   CT Cervical Spine Wo Contrast Result Date: 04/24/2024 CLINICAL DATA:  89 year old female with dizziness, syncope. EXAM: CT CERVICAL SPINE WITHOUT CONTRAST TECHNIQUE: Multidetector CT imaging of the cervical spine was performed without intravenous contrast. Multiplanar CT image reconstructions were also generated. RADIATION DOSE REDUCTION: This exam was performed according to the departmental dose-optimization program which includes automated exposure control, adjustment of the mA and/or kV according to patient size and/or use of iterative reconstruction technique. COMPARISON:  Head CT today. FINDINGS: Alignment: Mild straightening of cervical lordosis. Cervicothoracic junction alignment  is within normal limits. Maintained posterior element alignment. Skull base and vertebrae: Bone mineralization is within normal limits for age. Visualized skull base is intact. No atlanto-occipital dissociation. C1 and C2 appear intact and aligned. No acute osseous abnormality identified. Soft tissues and spinal canal: No prevertebral fluid or swelling. No visible canal hematoma. Negative visible noncontrast neck soft tissues, mild for age calcified carotid atherosclerosis. Disc levels: Degenerative appearing mild anterolisthesis of C4 on C5. Cervical spine degeneration elsewhere is mild for age. Upper chest: Visible upper thoracic levels appear intact. Mild apical lung scarring.  IMPRESSION: 1. No acute traumatic injury identified in the cervical spine. 2. Generally mild for age cervical spine degeneration. Electronically Signed   By: Marlise Simpers M.D.   On: 04/24/2024 11:57   CT Head Wo Contrast Result Date: 04/24/2024 CLINICAL DATA:  89 year old female with dizziness, syncope. EXAM: CT HEAD WITHOUT CONTRAST TECHNIQUE: Contiguous axial images were obtained from the base of the skull through the vertex without intravenous contrast. RADIATION DOSE REDUCTION: This exam was performed according to the departmental dose-optimization program which includes automated exposure control, adjustment of the mA and/or kV according to patient size and/or use of iterative reconstruction technique. COMPARISON:  Head CT 04/22/2024. FINDINGS: Brain: Cerebral volume remains normal for age. No midline shift, ventriculomegaly, mass effect, evidence of mass lesion, intracranial hemorrhage or evidence of cortically based acute infarction. Gray-white differentiation is stable and normal for age. Vascular: Calcified atherosclerosis at the skull base. No suspicious intracranial vascular hyperdensity. Skull: Stable and intact. Sinuses/Orbits: Visualized paranasal sinuses and mastoids are stable and well aerated. Other: Right anterior forehead scalp hematoma or contusion is new on series 3, image 40. Underlying right frontal bone intact. Stable and negative orbits. IMPRESSION: 1. Right anterior forehead scalp hematoma or contusion. No skull intact. 2. Stable and normal for age noncontrast CT appearance of the brain. Electronically Signed   By: Marlise Simpers M.D.   On: 04/24/2024 11:55    Family Communication: Discussed with patient, daughter at bedside. They understand and agree. All questions answered.  Disposition: Status is: Inpatient Remains inpatient appropriate because: syncope work up, bradycardia  Planned Discharge Destination: Home with Home Health     Time spent: 39 minutes  Author: Aisha Hove, MD 04/25/2024 2:53 PM Secure chat 7am to 7pm For on call review [email or web address].

## 2024-04-26 ENCOUNTER — Inpatient Hospital Stay (HOSPITAL_COMMUNITY)

## 2024-04-26 DIAGNOSIS — R55 Syncope and collapse: Secondary | ICD-10-CM | POA: Diagnosis not present

## 2024-04-26 DIAGNOSIS — R338 Other retention of urine: Secondary | ICD-10-CM | POA: Diagnosis not present

## 2024-04-26 DIAGNOSIS — R42 Dizziness and giddiness: Secondary | ICD-10-CM

## 2024-04-26 DIAGNOSIS — E1122 Type 2 diabetes mellitus with diabetic chronic kidney disease: Secondary | ICD-10-CM | POA: Diagnosis not present

## 2024-04-26 DIAGNOSIS — E1159 Type 2 diabetes mellitus with other circulatory complications: Secondary | ICD-10-CM | POA: Diagnosis not present

## 2024-04-26 LAB — GLUCOSE, CAPILLARY
Glucose-Capillary: 140 mg/dL — ABNORMAL HIGH (ref 70–99)
Glucose-Capillary: 194 mg/dL — ABNORMAL HIGH (ref 70–99)

## 2024-04-26 LAB — ECHOCARDIOGRAM COMPLETE
AR max vel: 2.34 cm2
AV Area VTI: 2.61 cm2
AV Area mean vel: 2.38 cm2
AV Mean grad: 2 mmHg
AV Peak grad: 4.3 mmHg
Ao pk vel: 1.04 m/s
Area-P 1/2: 3.6 cm2
Calc EF: 70.2 %
Height: 66 in
S' Lateral: 3 cm
Single Plane A2C EF: 72.3 %
Single Plane A4C EF: 70.1 %
Weight: 1920 [oz_av]

## 2024-04-26 LAB — HEMOGLOBIN A1C
Hgb A1c MFr Bld: 8.4 % — ABNORMAL HIGH (ref 4.8–5.6)
Mean Plasma Glucose: 194.38 mg/dL

## 2024-04-26 MED ORDER — VALACYCLOVIR HCL 500 MG PO TABS
500.0000 mg | ORAL_TABLET | Freq: Every day | ORAL | Status: DC
  Administered 2024-04-26: 500 mg via ORAL
  Filled 2024-04-26: qty 1

## 2024-04-26 MED ORDER — PERFLUTREN LIPID MICROSPHERE
1.0000 mL | INTRAVENOUS | Status: AC | PRN
  Administered 2024-04-26: 2 mL via INTRAVENOUS

## 2024-04-26 MED ORDER — VALACYCLOVIR HCL 500 MG PO TABS: 500.0000 mg | ORAL_TABLET | Freq: Every day | ORAL | Status: DC

## 2024-04-26 NOTE — Evaluation (Signed)
 Occupational Therapy Evaluation Patient Details Name: Candice Brewer MRN: [id number] DOB: [birth year] Today's Date: 04/26/2024   History of Present Illness   89 y.o. female presented to the ED with reports of passing out.  She reports dizziness of just over a week duration now, standing or lying. PMH significant for diabetes mellitus, CKD 3, restless leg syndrome, dyslipidemia.     Clinical Impressions Pt c/o pain to L hip and shin. Pt lives at home with daughter, son in law, and grandson, available nearly 24/7, PLOF mod I with RW around home. Pt with 1 fall in the last year, Pt and daughter report increasing difficulty with ambulation, recently transitioned from walking stick to RW due to increased need for support. Pt currently close to baseline, set up for ADLs, good overall BUE strength, VSS during activities, mild dizziness upon standing. Pt required max effort and min A to stand from recliner, but once upright with RW Pt CGA for ambulation. At this time recommending HHOT follow up to maximize safety in home environment with ADLs and mobility due to increasing difficulty getting around home and out in community. Pt not able to ambulate out in community as easily, recommending transport chair for home. Will continue to follow acutely to progress as able.      If plan is discharge home, recommend the following:   A little help with walking and/or transfers;A little help with bathing/dressing/bathroom;Assistance with cooking/housework;Assist for transportation;Help with stairs or ramp for entrance     Functional Status Assessment   Patient has had a recent decline in their functional status and demonstrates the ability to make significant improvements in function in a reasonable and predictable amount of time.     Equipment Recommendations   Other (comment) (transport chair for community)     Recommendations for Other Services         Precautions/Restrictions    Precautions Precautions: Fall Recall of Precautions/Restrictions: Intact Restrictions Weight Bearing Restrictions Per Provider Order: No     Mobility Bed Mobility               General bed mobility comments: in recliner    Transfers Overall transfer level: Needs assistance Equipment used: Rolling walker (2 wheels) Transfers: Sit to/from Stand, Bed to chair/wheelchair/BSC Sit to Stand: Min assist     Step pivot transfers: Contact guard assist     General transfer comment: min A to power STS, max effort from Pt. Once upright, CGA for taking steps      Balance Overall balance assessment: Needs assistance Sitting-balance support: No upper extremity supported, Feet supported Sitting balance-Leahy Scale: Good     Standing balance support: Bilateral upper extremity supported, During functional activity Standing balance-Leahy Scale: Fair Standing balance comment: able to stand with one hand support on RW, no LOB, does have history of dizziness and recent fall,                           ADL either performed or assessed with clinical judgement   ADL Overall ADL's : At baseline;Needs assistance/impaired Eating/Feeding: Independent   Grooming: Set up;Sitting   Upper Body Bathing: Set up;Sitting   Lower Body Bathing: Set up;Sitting/lateral leans   Upper Body Dressing : Set up;Sitting   Lower Body Dressing: Set up;Sit to/from stand   Toilet Transfer: Contact guard assist;Minimal assistance;Rolling walker (2 wheels)   Toileting- Clothing Manipulation and Hygiene: Set up;Sitting/lateral lean       Functional mobility during  ADLs: Contact guard assist;Rolling walker (2 wheels) General ADL Comments: Pt doing well, set up for most ADLs, mainly needs help with sit to stand, max effort from Pt. Once upright with RW, CGA for ambulation     Vision Baseline Vision/History: 1 Wears glasses Ability to See in Adequate Light: 0 Adequate Patient Visual Report: No  change from baseline       Perception         Praxis         Pertinent Vitals/Pain Pain Assessment Pain Assessment: 0-10 Pain Score: 6  Pain Location: L hip and shin Pain Descriptors / Indicators: Aching, Discomfort Pain Intervention(s): Monitored during session     Extremity/Trunk Assessment Upper Extremity Assessment Upper Extremity Assessment: Overall WFL for tasks assessed   Lower Extremity Assessment Lower Extremity Assessment: Defer to PT evaluation       Communication Communication Communication: No apparent difficulties   Cognition Arousal: Alert Behavior During Therapy: WFL for tasks assessed/performed Cognition: No apparent impairments                               Following commands: Intact       Cueing  General Comments   Cueing Techniques: Verbal cues  VSS during session   Exercises     Shoulder Instructions      Home Living Family/patient expects to be discharged to:: Private residence Living Arrangements: Children Available Help at Discharge: Family;Available 24 hours/day;Other (Comment) (daughter, son in law, and grandson) Type of Home: House Home Access: Stairs to enter [employer] of Steps: 1 step   Home Layout: Two level;Able to live on main level with bedroom/bathroom     Bathroom Shower/Tub: Walk-in shower         Home Equipment: Shower seat - built in;Shower seat;Grab bars - Chartered loss adjuster (2 wheels);Cane - single point   Additional Comments: Pt lives with daughter, son in law, and grandson.      Prior Functioning/Environment Prior Level of Function : Needs assist             Mobility Comments: Pt started using RW for last 2 months due to hip pain, used walking stick prior. 1 fall in the last year ADLs Comments: mod I    OT Problem List: Decreased strength;Decreased range of motion;Decreased activity tolerance;Impaired balance (sitting and/or standing);Pain   OT  Treatment/Interventions: Self-care/ADL training;Therapeutic exercise;Energy conservation;DME and/or AE instruction;Therapeutic activities;Balance training;Patient/family education      OT Goals(Current goals can be found in the care plan section)   Acute Rehab OT Goals Patient Stated Goal: to improve strength/balance OT Goal Formulation: With patient/family Time For Goal Achievement: 05/10/24 Potential to Achieve Goals: Good   OT Frequency:  Min 1X/week    Co-evaluation              AM-PAC OT "6 Clicks" Daily Activity     Outcome Measure Help from another person eating meals?: None Help from another person taking care of personal grooming?: A Little Help from another person toileting, which includes using toliet, bedpan, or urinal?: A Little Help from another person bathing (including washing, rinsing, drying)?: A Little Help from another person to put on and taking off regular upper body clothing?: A Little Help from another person to put on and taking off regular lower body clothing?: A Little 6 Click Score: 19   End of Session Equipment Utilized During Treatment: Gait belt;Rolling walker (2 wheels) Nurse Communication: Mobility  status  Activity Tolerance: Patient tolerated treatment well Patient left: in chair;with call bell/phone within reach;with family/visitor present  OT Visit Diagnosis: Unsteadiness on feet (R26.81);Other abnormalities of gait and mobility (R26.89);Muscle weakness (generalized) (M62.81);Dizziness and giddiness (R42);Pain Pain - Right/Left: Left Pain - part of body: Hip;Leg                Time: 8119-1478 OT Time Calculation (min): 36 min Charges:  OT General Charges $OT Visit: 1 Visit OT Evaluation $OT Eval Low Complexity: 1 Low OT Treatments $Self Care/Home Management : 8-22 mins  [place], OTR/L   Scherry Curtis 04/26/2024, 12:16 PM

## 2024-04-26 NOTE — Plan of Care (Signed)

## 2024-04-26 NOTE — Progress Notes (Signed)
 Transition of Care [facility]) - Inpatient Brief Assessment   Patient Details  Name: DYLLAN KATS MRN: [id number] Date of Birth: 04/24/1932  [facility] CM/SW Contact:    Dane Dung, RN Phone Number: 04/26/2024, 2:43 PM   Clinical Narrative: CM met with the patient and son-in-law at the bedside to discuss TOC needs.  The patient admitted for Syncopal episode and plans to return home when medically stable for discharge.  DME at the home includes RW.  Patient and daughter were offered Medicare choice regarding home health and daughter did not have a preference.  I called Randel Buss, RNCM with [facility] and he accepted for services for PT/OT.  No other TOC needs at this time.   Transition of Care Asessment: Insurance and Status: (P) Insurance coverage has been reviewed Patient has primary care physician: (P) Yes Home environment has been reviewed: (P) from home with family Prior level of function:: (P) RW Prior/Current Home Services: (P) No current home services Social Drivers of Health Review: (P) SDOH reviewed interventions complete Readmission risk has been reviewed: (P) Yes Transition of care needs: (P) transition of care needs identified, TOC will continue to follow

## 2024-04-26 NOTE — Discharge Summary (Signed)
 Physician Discharge Summary   Patient: Candice Brewer MRN: [id number] DOB: 1932-07-30  Admit date:     04/24/2024  Discharge date: 04/26/24  Discharge Physician: Aisha Hove   PCP: Galvin Jules, FNP   Recommendations at discharge:    PCP follow up in 1 week. Holter monitor for 2 weeks, follow EP clinic as scheduled.  Discharge Diagnoses: Principal Problem:   Syncope Active Problems:   Type 2 diabetes mellitus (HCC)   Hypertension associated with diabetes (HCC)   CKD stage 3 due to type 2 diabetes mellitus (HCC)   Acute urinary retention  Resolved Problems:   * No resolved hospital problems. *  Hospital Course: Candice Brewer is a 89 y.o. female with medical history significant for diabetes mellitus, CKD 3, restless leg syndrome, dyslipidemia.   Patient presented to the ED with reports of passing out.  She reports dizziness of just over a week duration now, present when standing or lying, for which she presented to the ED 5/25, also reported ringing in her ears.  Head CT was unremarkable.  She was given a liter bolus, fosfomycin for suspected UTI, meclizine, and instructions for Epley maneuver. She is back to ED yesterday to [place], where her HR is lower side transferred to [facility] for EP evaaluation.  Assessment and Plan: * Syncope Likely vagal episode. EP evaluation appreciated- advised 2 week monitor as an outpatient and can follow-up in EP clinic as scheduled. Echocardiogram pending. TSH, magnesium  wnl. Orthostatics unremarkable. No more episodes of bradycardia or low BP noted.  She did report lightheadedness with movement, positional change. Possible component of benign positional vertigo, advised ENT eval for tilt table test.  HH PT/ OT arranged.  Acute urinary retention Foley catheter removed and she is able to void.  CKD stage 3 due to type 2 diabetes mellitus (HCC) Baseline CKD stage IIIb. ACE inhibitor held for now if BP better can be resumed as  outpatient.  Hypertension associated with diabetes (HCC) Hydrochlorothiazide  resumed. ACE inhibitor held for now if BP better can be resumed as outpatient.  Type 2 diabetes mellitus (HCC) Resumed home sitagliptin-metformin.      Consultants: EP Procedures performed: none  Disposition: Home health Diet recommendation:  Discharge Diet Orders (From admission, onward)     Start     Ordered   04/26/24 0000  Diet - low sodium heart healthy        04/26/24 1509           Cardiac diet DISCHARGE MEDICATION: Allergies as of 04/26/2024       Reactions   Benzalkonium Other (See Comments)   Unknown    Benzalkonium Chloride Other (See Comments)   Unknown   Metformin And Related Other (See Comments)   Contraindicated due to chronic kidney disease GFR 30s   Neomy-bacit-polymyx-pramoxine Rash   Neomycin-bacitracin Zn-polymyx Rash   "Severe skin rash"   Pravastatin Sodium Other (See Comments)   "Funny feeling and leg pain"   Rosuvastatin Rash        Medication List     STOP taking these medications    enalapril  10 MG tablet Commonly known as: Vasotec        TAKE these medications    aspirin  EC 81 MG tablet Take 81 mg by mouth daily.   cholecalciferol 1000 units tablet Commonly known as: VITAMIN D  Take 1,000 Units by mouth daily.   Combigan  0.2-0.5 % ophthalmic solution Generic drug: brimonidine -timolol  Place 1 drop into both eyes 2 (two) times  daily.   empagliflozin  10 MG Tabs tablet Commonly known as: JARDIANCE  Take 10 mg by mouth daily.   hydrochlorothiazide  25 MG tablet Commonly known as: HYDRODIURIL  TAKE ONE TABLET EVERY DAY   meclizine  25 MG tablet Commonly known as: ANTIVERT  Take 1 tablet (25 mg total) by mouth 3 (three) times daily as needed for dizziness.   multivitamin with minerals Tabs tablet Take 1 tablet by mouth daily.   OVER THE COUNTER MEDICATION Apply 1 Application topically as needed (pain). "Amish gel"   Systane 0.4-0.3 %  Soln Generic drug: Polyethyl Glycol-Propyl Glycol Place 1 drop into both eyes as needed (dry eyes).   Systane 0.4-0.3 % Gel ophthalmic gel Generic drug: Polyethyl Glycol-Propyl Glycol Place 1 Application into both eyes at bedtime.   valACYclovir  1000 MG tablet Commonly known as: VALTREX  Take 1,000 mg by mouth 2 (two) times daily.   Vyzulta  0.024 % Soln Generic drug: Latanoprostene Bunod  Apply 1 drop to eye at bedtime.        Follow-up Information     Care, [facility] Follow up.   Specialty: Home Health Services Why: [facility] will provide home health services when you are discharged home. Contact information: [address] [place] [address] [phone]                Discharge Exam: Filed Weights   04/24/24 1031  Weight: 54.4 kg      04/26/2024   12:15 PM 04/26/2024    7:40 AM 04/26/2024    4:00 AM  Vitals with BMI  Systolic 130 123 914  Diastolic 70 59 59  Pulse 71 67 77    General - Elderly thin built [place] American female, no acute distress. HEENT - PERRLA, EOMI, atraumatic head, non tender sinuses. Lung - Clear, basal rales, rhonchi, wheezes. Heart - S1, S2 heard, no murmurs, rubs, no pedal edema. Abdomen - Soft, non tender, bowel sounds good Neuro - Alert, awake and oriented, non focal exam. Skin - Warm and dry.  Condition at discharge: stable  The results of significant diagnostics from this hospitalization (including imaging, microbiology, ancillary and laboratory) are listed below for reference.   Imaging Studies: ECHOCARDIOGRAM COMPLETE Result Date: 04/26/2024    ECHOCARDIOGRAM REPORT   Patient Name:   Candice Brewer Date of Exam: 04/26/2024 Medical Rec #:  [id number]      Height:       66.0 in Accession #:    [id number]     Weight:       120.0 lb Date of Birth:  1932/03/02      BSA:          1.610 m Patient Age:    89 years       BP:           123/59 mmHg Patient Gender: F              HR:           62 bpm.  Exam Location:  Inpatient Procedure: 2D Echo, Color Doppler and Cardiac Doppler (Both Spectral and Color            Flow Doppler were utilized during procedure). Indications:    R55 Syncope  History:        Patient has no prior history of Echocardiogram examinations.                 Risk Factors:Diabetes and Hypertension.  Sonographer:    Andrena Bang Referring Phys:  [id number] Yaeko Fazekas IMPRESSIONS  1. Left ventricular ejection fraction, by estimation, is 60 to 65%. The left ventricle has normal function. The left ventricle has no regional wall motion abnormalities. Left ventricular diastolic parameters are consistent with Grade I diastolic dysfunction (impaired relaxation).  2. Right ventricular systolic function is normal. The right ventricular size is moderately enlarged.  3. The mitral valve is normal in structure. No evidence of mitral valve regurgitation. No evidence of mitral stenosis.  4. The aortic valve is normal in structure. Aortic valve regurgitation is not visualized. No aortic stenosis is present.  5. The inferior vena cava is normal in size with greater than 50% respiratory variability, suggesting right atrial pressure of 3 mmHg. FINDINGS  Left Ventricle: Left ventricular ejection fraction, by estimation, is 60 to 65%. The left ventricle has normal function. The left ventricle has no regional wall motion abnormalities. Definity contrast agent was given IV to delineate the left ventricular  endocardial borders. The left ventricular internal cavity size was normal in size. There is no left ventricular hypertrophy. Left ventricular diastolic parameters are consistent with Grade I diastolic dysfunction (impaired relaxation). Normal left ventricular filling pressure. Right Ventricle: The right ventricular size is moderately enlarged. No increase in right ventricular wall thickness. Right ventricular systolic function is normal. Left Atrium: Left atrial size was normal in size. Right Atrium: Right  atrial size was normal in size. Pericardium: There is no evidence of pericardial effusion. Mitral Valve: The mitral valve is normal in structure. No evidence of mitral valve regurgitation. No evidence of mitral valve stenosis. Tricuspid Valve: The tricuspid valve is normal in structure. Tricuspid valve regurgitation is mild . No evidence of tricuspid stenosis. Aortic Valve: The aortic valve is normal in structure. Aortic valve regurgitation is not visualized. No aortic stenosis is present. Aortic valve mean gradient measures 2.0 mmHg. Aortic valve peak gradient measures 4.3 mmHg. Aortic valve area, by VTI measures 2.61 cm. Pulmonic Valve: The pulmonic valve was normal in structure. Pulmonic valve regurgitation is trivial. No evidence of pulmonic stenosis. Aorta: The aortic root is normal in size and structure. Venous: The inferior vena cava is normal in size with greater than 50% respiratory variability, suggesting right atrial pressure of 3 mmHg. IAS/Shunts: No atrial level shunt detected by color flow Doppler.  LEFT VENTRICLE PLAX 2D LVIDd:         4.30 cm     Diastology LVIDs:         3.00 cm     LV e' medial:    5.28 cm/s LV PW:         0.90 cm     LV E/e' medial:  11.7 LV IVS:        1.00 cm     LV e' lateral:   7.62 cm/s LVOT diam:     1.90 cm     LV E/e' lateral: 8.1 LV SV:         57 LV SV Index:   35 LVOT Area:     2.84 cm  LV Volumes (MOD) LV vol d, MOD A2C: 61.7 ml LV vol d, MOD A4C: 81.9 ml LV vol s, MOD A2C: 17.1 ml LV vol s, MOD A4C: 24.5 ml LV SV MOD A2C:     44.6 ml LV SV MOD A4C:     81.9 ml LV SV MOD BP:      50.3 ml RIGHT VENTRICLE RV S prime:     11.50 cm/s TAPSE (M-mode): 2.1 cm LEFT ATRIUM  Index LA diam:        2.90 cm 1.80 cm/m LA Vol (A2C):   19.6 ml 12.18 ml/m LA Vol (A4C):   20.0 ml 12.43 ml/m LA Biplane Vol: 20.9 ml 12.99 ml/m  AORTIC VALVE AV Area (Vmax):    2.34 cm AV Area (Vmean):   2.38 cm AV Area (VTI):     2.61 cm AV Vmax:           104.00 cm/s AV Vmean:           64.100 cm/s AV VTI:            0.218 m AV Peak Grad:      4.3 mmHg AV Mean Grad:      2.0 mmHg LVOT Vmax:         85.70 cm/s LVOT Vmean:        53.900 cm/s LVOT VTI:          0.201 m LVOT/AV VTI ratio: 0.92  AORTA Ao Asc diam: 3.20 cm MITRAL VALVE               TRICUSPID VALVE MV Area (PHT): 3.60 cm    TR Peak grad:   23.2 mmHg MV Decel Time: 211 msec    TR Vmax:        241.00 cm/s MV E velocity: 61.60 cm/s MV A velocity: 81.00 cm/s  SHUNTS MV E/A ratio:  0.76        Systemic VTI:  0.20 m                            Systemic Diam: 1.90 cm Gaylyn Keas MD Electronically signed by Gaylyn Keas MD Signature Date/Time: 04/26/2024/11:03:05 AM    Final    CT ABDOMEN PELVIS W CONTRAST Result Date: 04/24/2024 CLINICAL DATA:  Abdominal pain. EXAM: CT ABDOMEN AND PELVIS WITH CONTRAST TECHNIQUE: Multidetector CT imaging of the abdomen and pelvis was performed using the standard protocol following bolus administration of intravenous contrast. RADIATION DOSE REDUCTION: This exam was performed according to the departmental dose-optimization program which includes automated exposure control, adjustment of the mA and/or kV according to patient size and/or use of iterative reconstruction technique. CONTRAST:  80mL OMNIPAQUE  IOHEXOL  300 MG/ML  SOLN COMPARISON:  None Available. FINDINGS: Lower chest: The visualized lung bases are clear. No intra-abdominal free air or free fluid. Hepatobiliary: The liver is unremarkable. Mild biliary dilatation. A tiny gallstone suspected in the gallbladder fundus. The gallbladder is otherwise unremarkable. Pancreas: Unremarkable. No pancreatic ductal dilatation or surrounding inflammatory changes. Spleen: Normal in size without focal abnormality. Adrenals/Urinary Tract: The adrenal glands are unremarkable. The kidneys, visualized ureters appear unremarkable. The urinary bladder is distended and grossly unremarkable. Stomach/Bowel: Moderate amount of stool throughout the colon. No bowel obstruction  or active inflammation. The appendix is not visualized with certainty. No inflammatory changes identified in the right lower quadrant. Vascular/Lymphatic: Moderate aortoiliac atherosclerotic disease. The IVC is unremarkable. No portal venous gas. There is no adenopathy. Reproductive: The uterus is anteverted. Uterine fibroid noted. No suspicious adnexal masses. Other: None Musculoskeletal: Osteopenia with degenerative changes of the spine. Severe bilateral hip arthritic changes. No acute osseous pathology. IMPRESSION: 1. No acute intra-abdominal or pelvic pathology. 2. Moderate colonic stool burden. No bowel obstruction. 3.  Aortic Atherosclerosis (ICD10-I70.0). Electronically Signed   By: Angus Bark M.D.   On: 04/24/2024 16:37   DG Chest Portable 1 View Result Date: 04/24/2024 CLINICAL DATA:  syncope EXAM:  PORTABLE CHEST - 1 VIEW COMPARISON:  05/12/2016 FINDINGS: Coarse perihilar and infrahilar interstitial markings, slightly decreased. No new infiltrate or overt edema. Heart size and mediastinal contours are within normal limits. Aortic Atherosclerosis (ICD10-170.0). Chronic blunting of the right lateral costophrenic angle. Visualized bones unremarkable. IMPRESSION: Chronic interstitial changes without acute findings. Electronically Signed   By: Nicoletta Barrier M.D.   On: 04/24/2024 13:47   CT Cervical Spine Wo Contrast Result Date: 04/24/2024 CLINICAL DATA:  89 year old female with dizziness, syncope. EXAM: CT CERVICAL SPINE WITHOUT CONTRAST TECHNIQUE: Multidetector CT imaging of the cervical spine was performed without intravenous contrast. Multiplanar CT image reconstructions were also generated. RADIATION DOSE REDUCTION: This exam was performed according to the departmental dose-optimization program which includes automated exposure control, adjustment of the mA and/or kV according to patient size and/or use of iterative reconstruction technique. COMPARISON:  Head CT today. FINDINGS: Alignment: Mild  straightening of cervical lordosis. Cervicothoracic junction alignment is within normal limits. Maintained posterior element alignment. Skull base and vertebrae: Bone mineralization is within normal limits for age. Visualized skull base is intact. No atlanto-occipital dissociation. C1 and C2 appear intact and aligned. No acute osseous abnormality identified. Soft tissues and spinal canal: No prevertebral fluid or swelling. No visible canal hematoma. Negative visible noncontrast neck soft tissues, mild for age calcified carotid atherosclerosis. Disc levels: Degenerative appearing mild anterolisthesis of C4 on C5. Cervical spine degeneration elsewhere is mild for age. Upper chest: Visible upper thoracic levels appear intact. Mild apical lung scarring. IMPRESSION: 1. No acute traumatic injury identified in the cervical spine. 2. Generally mild for age cervical spine degeneration. Electronically Signed   By: Marlise Simpers M.D.   On: 04/24/2024 11:57   CT Head Wo Contrast Result Date: 04/24/2024 CLINICAL DATA:  89 year old female with dizziness, syncope. EXAM: CT HEAD WITHOUT CONTRAST TECHNIQUE: Contiguous axial images were obtained from the base of the skull through the vertex without intravenous contrast. RADIATION DOSE REDUCTION: This exam was performed according to the departmental dose-optimization program which includes automated exposure control, adjustment of the mA and/or kV according to patient size and/or use of iterative reconstruction technique. COMPARISON:  Head CT 04/22/2024. FINDINGS: Brain: Cerebral volume remains normal for age. No midline shift, ventriculomegaly, mass effect, evidence of mass lesion, intracranial hemorrhage or evidence of cortically based acute infarction. Gray-white differentiation is stable and normal for age. Vascular: Calcified atherosclerosis at the skull base. No suspicious intracranial vascular hyperdensity. Skull: Stable and intact. Sinuses/Orbits: Visualized paranasal sinuses and  mastoids are stable and well aerated. Other: Right anterior forehead scalp hematoma or contusion is new on series 3, image 40. Underlying right frontal bone intact. Stable and negative orbits. IMPRESSION: 1. Right anterior forehead scalp hematoma or contusion. No skull intact. 2. Stable and normal for age noncontrast CT appearance of the brain. Electronically Signed   By: Marlise Simpers M.D.   On: 04/24/2024 11:55   CT Head Wo Contrast Result Date: 04/22/2024 CLINICAL DATA:  Dizziness. EXAM: CT HEAD WITHOUT CONTRAST TECHNIQUE: Contiguous axial images were obtained from the base of the skull through the vertex without intravenous contrast. RADIATION DOSE REDUCTION: This exam was performed according to the departmental dose-optimization program which includes automated exposure control, adjustment of the mA and/or kV according to patient size and/or use of iterative reconstruction technique. COMPARISON:  Right twenty-fifth 2012 FINDINGS: Brain: There is mild cerebral atrophy with widening of the extra-axial spaces and ventricular dilatation. There is no evidence of acute infarction, hemorrhage, hydrocephalus, extra-axial collection or mass lesion/mass effect.  Vascular: Moderate to marked severity bilateral cavernous carotid artery calcification is noted. Skull: Normal. Negative for fracture or focal lesion. Sinuses/Orbits: No acute finding. Other: None. IMPRESSION: No acute intracranial abnormality. Electronically Signed   By: Virgle Grime M.D.   On: 04/22/2024 22:27    Microbiology: Results for orders placed or performed during the hospital encounter of 04/24/24  MRSA Next Gen by PCR, Nasal     Status: None   Collection Time: 04/25/24  9:57 AM   Specimen: Nasal Mucosa; Nasal Swab  Result Value Ref Range Status   MRSA by PCR Next Gen NOT DETECTED NOT DETECTED Final    Comment: (NOTE) The GeneXpert MRSA Assay (FDA approved for NASAL specimens only), is one component of a comprehensive MRSA colonization  surveillance program. It is not intended to diagnose MRSA infection nor to guide or monitor treatment for MRSA infections. Test performance is not FDA approved in patients less than 45 years old. Performed at [facility], [address] [place], [place] [address]     Labs: CBC: Recent Labs  Lab 04/22/24 1800 04/24/24 1046 04/25/24 0557  WBC 8.6 8.1 8.6  HGB 12.9 13.6 11.8*  HCT 39.1 40.4 35.4*  MCV 103.4* 102.3* 101.7*  PLT 253 250 219   Basic Metabolic Panel: Recent Labs  Lab 04/22/24 1800 04/24/24 1046 04/24/24 1232 04/25/24 0557  NA 135 138  --  141  K 4.8 4.6  --  3.6  CL 101 102  --  108  CO2 22 23  --  24  GLUCOSE 209* 221*  --  136*  BUN 47* 37*  --  31*  CREATININE 1.48* 1.45*  --  1.39*  CALCIUM 10.2 10.1  --  9.8  MG  --   --  1.9  --    Liver Function Tests: Recent Labs  Lab 04/22/24 1800  AST 26  ALT 22  ALKPHOS 92  BILITOT 0.8  PROT 7.7  ALBUMIN 3.7   CBG: Recent Labs  Lab 04/25/24 0608 04/25/24 1302 04/25/24 1757 04/26/24 0741 04/26/24 1212  GLUCAP 141* 292* 124* 140* 194*    Discharge time spent: 36 minutes.  Signed: Aisha Hove, MD [facility] 04/26/2024

## 2024-04-26 NOTE — Progress Notes (Signed)
*  PRELIMINARY RESULTS* Echocardiogram 2D Echocardiogram has been performed.  Candice Brewer 04/26/2024, 10:52 AM

## 2024-04-26 NOTE — Evaluation (Signed)
 Physical Therapy Evaluation Patient Details Name: Candice Brewer MRN: [id number] DOB: 06-14-1932 Today's Date: 04/26/2024  History of Present Illness  Pt is a 89 y/o F admitted on 04/24/24 after presenting with c/o passing out. Pt is being treated for syncope, sinus bradycardia. PMH: DM, CKD 3, restless leg syndrome, dyslipidemia  Clinical Impression  Pt seen for PT evaluation with pt agreeable, son in law present intermittently throughout session. Pt reports prior to admission she was ambulatory with RW, still driving & going to the rec center for seated LE exercise classes 2x/week. On this date, pt transfers sit<>stand from recliner, toilet with CGA fade to supervision.  Pt is able to ambulate into hallway & bathroom with RW & supervision & toilet without assistance. Pt is making good progress with mobility. Will continue to follow acutely.      If plan is discharge home, recommend the following: A little help with walking and/or transfers;Assist for transportation;A little help with bathing/dressing/bathroom;Assistance with cooking/housework;Help with stairs or ramp for entrance   Can travel by private vehicle        Equipment Recommendations None recommended by PT  Recommendations for Other Services       Functional Status Assessment Patient has had a recent decline in their functional status and demonstrates the ability to make significant improvements in function in a reasonable and predictable amount of time.     Precautions / Restrictions Precautions Precautions: Fall Restrictions Weight Bearing Restrictions Per Provider Order: No      Mobility  Bed Mobility               General bed mobility comments: not tested, pt received & left in recliner    Transfers Overall transfer level: Needs assistance Equipment used: Rolling walker (2 wheels) Transfers: Sit to/from Stand Sit to Stand: Contact guard assist, Supervision           General transfer comment: CGA on  first attempt fade to supervision when transferring sit<>stand from recliner, sit<>stand from toilet with grab bar & supervision    Ambulation/Gait Ambulation/Gait assistance: Supervision Gait Distance (Feet): 120 Feet Assistive device: Rolling walker (2 wheels) Gait Pattern/deviations: Step-to pattern, Decreased step length - left, Decreased step length - right, Decreased stride length Gait velocity: decreased     General Gait Details: decreased step length LLE>RLE, no overt LOB  Stairs            Wheelchair Mobility     Tilt Bed    Modified Rankin (Stroke Patients Only)       Balance Overall balance assessment: Needs assistance Sitting-balance support: No upper extremity supported, Feet supported Sitting balance-Leahy Scale: Good Sitting balance - Comments: toileted without assistance   Standing balance support: Bilateral upper extremity supported, Reliant on assistive device for balance, During functional activity Standing balance-Leahy Scale: Good                               Pertinent Vitals/Pain Pain Assessment Pain Assessment: Faces Faces Pain Scale: Hurts a little bit Pain Location: L thigh, below L ankle Pain Descriptors / Indicators: Discomfort Pain Intervention(s): Monitored during session    Home Living Family/patient expects to be discharged to:: Private residence Living Arrangements: Children Available Help at Discharge: Family;Available 24 hours/day;Other (Comment) (daughter, son in law, grandson) Type of Home: House Home Access: Stairs to enter   [employer] of Steps: 1 step at side entrance   Home Layout: Two level;Able to  live on main level with bedroom/bathroom (main level + basement) Home Equipment: Shower seat - built in;Shower seat;Grab bars - Chartered loss adjuster (2 wheels);Cane - single point Additional Comments: Pt lives with daughter, son in law, and grandson.    Prior Function Prior Level of Function  : Needs assist             Mobility Comments: Pt started using RW for last 2 months due to hip pain, used walking stick prior. 1 fall in the last year, attending classes at the rec center 2x/week, still driving. ADLs Comments: mod I     Extremity/Trunk Assessment   Upper Extremity Assessment Upper Extremity Assessment: Overall WFL for tasks assessed    Lower Extremity Assessment Lower Extremity Assessment: Generalized weakness (reports chronic LLE pain, & "bone on bone")       Communication   Communication Communication: No apparent difficulties    Cognition Arousal: Alert Behavior During Therapy: WFL for tasks assessed/performed   PT - Cognitive impairments: No apparent impairments                         Following commands: Intact       Cueing Cueing Techniques: Verbal cues     General Comments General comments (skin integrity, edema, etc.): Pt with continent void & BM, performs peri hygiene without assistance. HR 79-93 bpm during session, c/o slight dizziness 1 time during session.    Exercises Other Exercises Other Exercises: Pt performed 5x sit<>stand with BUE support with supervision for BLE & generalized strengthening.   Assessment/Plan    PT Assessment Patient needs continued PT services  PT Problem List Decreased strength;Decreased activity tolerance;Decreased balance;Decreased mobility       PT Treatment Interventions DME instruction;Balance training;Gait training;Neuromuscular re-education;Stair training;Functional mobility training;Therapeutic activities;Therapeutic exercise;Patient/family education    PT Goals (Current goals can be found in the Care Plan section)  Acute Rehab PT Goals Patient Stated Goal: go home PT Goal Formulation: With patient Time For Goal Achievement: 05/10/24 Potential to Achieve Goals: Good    Frequency Min 2X/week     Co-evaluation               AM-PAC PT "6 Clicks" Mobility  Outcome Measure  Help needed turning from your back to your side while in a flat bed without using bedrails?: None Help needed moving from lying on your back to sitting on the side of a flat bed without using bedrails?: A Little Help needed moving to and from a bed to a chair (including a wheelchair)?: A Little Help needed standing up from a chair using your arms (e.g., wheelchair or bedside chair)?: A Little Help needed to walk in hospital room?: A Little Help needed climbing 3-5 steps with a railing? : A Little 6 Click Score: 19    End of Session   Activity Tolerance: Patient tolerated treatment well Patient left: in chair;with call bell/phone within reach Nurse Communication: Mobility status PT Visit Diagnosis: Muscle weakness (generalized) (M62.81);Other abnormalities of gait and mobility (R26.89)    Time: 1324-4010 PT Time Calculation (min) (ACUTE ONLY): 20 min   Charges:   PT Evaluation $PT Eval Low Complexity: 1 Low   PT General Charges $$ ACUTE PT VISIT: 1 Visit         Emaline Handsome, PT, DPT 04/26/24, 1:39 PM   Venetta Gill 04/26/2024, 1:38 PM

## 2024-04-27 ENCOUNTER — Telehealth: Payer: Self-pay

## 2024-04-27 NOTE — Transitions of Care (Post Inpatient/ED Visit) (Signed)
 04/27/2024  Name: Candice Brewer MRN: [id number] DOB: 07-19-1932  Today's TOC FU Call Status: Today's TOC FU Call Status:: Successful TOC FU Call Completed TOC FU Call Complete Date: 04/27/24 Patient's Name and Date of Birth confirmed.  Transition Care Management Follow-up Telephone Call Date of Discharge: 04/26/24 Discharge Facility: [facility]) Type of Discharge: Inpatient Admission Primary Inpatient Discharge Diagnosis:: Syncope How have you been since you were released from the hospital?: Better Any questions or concerns?: No  Items Reviewed: Did you receive and understand the discharge instructions provided?: Yes Medications obtained,verified, and reconciled?: Yes (Medications Reviewed) Any new allergies since your discharge?: No Dietary orders reviewed?: Yes Type of Diet Ordered:: low sodium heart healthy Do you have support at home?: Yes People in Home [RPT]: child(ren), adult Name of Support/Comfort Primary Source: adult daugther, Pam lives with her - Pam's husband and adult son are present  Medications Reviewed Today: Medications Reviewed Today     Reviewed by Sharmaine Dearth, RN (Registered Nurse) on 04/27/24 at [phone]  Med List Status: <None>   Medication Order Taking? Sig Documenting Provider Last Dose Status Informant  aspirin  EC 81 MG tablet [id number] Yes Take 81 mg by mouth daily. [provider] Taking Active Self, Pharmacy Records, Other  brimonidine -timolol  (COMBIGAN ) 0.2-0.5 % ophthalmic solution [id number] Yes Place 1 drop into both eyes 2 (two) times daily.  [provider] Taking Active Self, Pharmacy Records, Other  cholecalciferol (VITAMIN D ) 1000 units tablet [id number] Yes Take 1,000 Units by mouth daily. [provider] Taking Active Self, Pharmacy Records, Other  empagliflozin  (JARDIANCE ) 10 MG TABS tablet [id number] Yes Take 10 mg by mouth daily. [provider] Taking Active   hydrochlorothiazide  (HYDRODIURIL ) 25  MG tablet [id number] Yes TAKE ONE TABLET EVERY DAY Rakes, Georgeann Kindred, FNP Taking Active Self, Pharmacy Records, Other  meclizine (ANTIVERT) 25 MG tablet [id number] Yes Take 1 tablet (25 mg total) by mouth 3 (three) times daily as needed for dizziness. Kommor, [place], MD Taking Active Self, Pharmacy Records, Other  Multiple Vitamin (MULTIVITAMIN WITH MINERALS) TABS tablet [id number] Yes Take 1 tablet by mouth daily. [provider] Taking Active Self, Pharmacy Records, Other  OVER THE COUNTER MEDICATION [id number] Yes Apply 1 Application topically as needed (pain). "Amish gel" [provider] Taking Active Self, Pharmacy Records, Other  Polyethyl Glycol-Propyl Glycol (SYSTANE) 0.4-0.3 % GEL ophthalmic gel [id number] No Place 1 Application into both eyes at bedtime.  Patient not taking: Reported on 04/27/2024   [provider] Not Taking Consider Medication Status and Discontinue (No longer needed (for PRN medications)) Self, Pharmacy Records, Other  Polyethyl Glycol-Propyl Glycol (SYSTANE) 0.4-0.3 % SOLN [id number] Yes Place 1 drop into both eyes as needed (dry eyes). [provider] Taking Active Self, Pharmacy Records, Other  valACYclovir (VALTREX) 1000 MG tablet [id number] Yes Take 1,000 mg by mouth 2 (two) times daily. Daughter reports patient taking 500mg  once daily - will discuss with PCP 05/02/24 [provider] Taking Active Self, Pharmacy Records, Other  VYZULTA 0.024 % SOLN [id number] Yes Apply 1 drop to eye at bedtime. [provider] Taking Active Self, Pharmacy Records, Other            Home Care and Equipment/Supplies: Were Home Health Services Ordered?: Yes Name of Home Health Agency:: [facility] Has Agency set up a time to come to your home?: No (daughter wants to call herself) Any new equipment or medical supplies ordered?: No  Functional Questionnaire: Do you need assistance  with bathing/showering or dressing?: Yes  (daughter stays in room during bath currently) Do you need assistance with meal preparation?: Yes (daughter currently preparing meals) Do you need assistance with eating?: No Do you have difficulty maintaining continence: No Do you need assistance with getting out of bed/getting out of a chair/moving?: No Do you have difficulty managing or taking your medications?: No  Follow up appointments reviewed: PCP Follow-up appointment confirmed?: Yes Date of PCP follow-up appointment?: 05/02/24 Follow-up Provider: Hyla Maillard Follow-up appointment confirmed?: Yes Date of Specialist follow-up appointment?: 05/28/24 Follow-Up Specialty Provider:: Creighton Doffing Do you need transportation to your follow-up appointment?: No (family drives patient) Do you understand care options if your condition(s) worsen?: Yes-patient verbalized understanding  SDOH Interventions Today    Flowsheet Row Most Recent Value  SDOH Interventions   Food Insecurity Interventions Intervention Not Indicated  Housing Interventions Intervention Not Indicated  Transportation Interventions Intervention Not Indicated  Utilities Interventions Intervention Not Indicated       Goals Addressed             This Visit's Progress    VBCI Transitions of Care (TOC) Care Plan       Problems:  Recent Hospitalization for treatment of Syncope Functional/Safety concern: Patient had syncopal episode resulting in her falling and hitting her head  Goal:  Over the next 30 days, the patient will not experience hospital readmission  Interventions:  Transitions of Care: Doctor Visits  - discussed the importance of doctor visits  Diabetes Interventions: Assessed patient's understanding of A1c goal: <6.5% Reviewed medications with patient and discussed importance of medication adherence Discussed plans with patient for ongoing care management follow up and provided patient with direct contact information for care  management team Reviewed scheduled/upcoming provider appointments including: PCP hospital follow up 05/02/24  Screening for signs and symptoms of depression related to chronic disease state  Assessed social determinant of health barriers Reviewed Integumentary: daughter states during hospitalization it was noticed the tips of her bilateral great toe were red and sensitive to touch and will discuss with PCP 05/03/23 - patient sees podiatrist    Reviewed CKD 3 and daughter reports patient has appointment tomorrow, 04/29/24 with hypertensive kidney specialist   Lab Results  Component Value Date   HGBA1C 8.4 (H) 04/26/2024    Falls Interventions: Reviewed medications and discussed potential side effects of medications such as dizziness and frequent urination Advised patient of importance of notifying provider of falls Assessed working status of life alert bracelet and patient adherence- patient reports having diabetic alert bracelet and life alert necklace Patient confirmed Holter monitor for 2 weeks, follow EP clinic as scheduled. Zio to be mailed to patient - Dr Lawana Pray to read - will call with any questions   Patient Self Care Activities:  Attend all scheduled provider appointments Call pharmacy for medication refills 3-7 days in advance of running out of medications Call provider office for new concerns or questions  Notify RN Care Manager of [facility] call rescheduling needs Participate in Transition of Care Program/Attend TOC scheduled calls Take medications as prescribed   keep appointment with eye doctor check feet daily for cuts, sores or redness wash and dry feet carefully every day wear comfortable, cotton socks wear comfortable, well-fitting shoes  Plan:  Telephone follow up appointment with care management team member scheduled for:  05/01/24 3pm with Primary TOC RN Belton Boy  The patient has been provided with contact information for the care management team and has been advised to  call with any health related questions or concerns.          Tonia Frankel RN, CCM [place]  VBCI-Population Health RN Care Manager [phone]

## 2024-04-27 NOTE — Patient Instructions (Signed)
 Visit Information  Thank you for taking time to visit with me today. Please don't hesitate to contact me if I can be of assistance to you before our next scheduled telephone appointment.  Our next appointment is by telephone on 05/01/24  at 3pm with primary [facility] RN Cecilie Coffee  Following is a copy of your care plan:   Goals Addressed             This Visit's Progress    VBCI Transitions of Care (TOC) Care Plan       Problems:  Recent Hospitalization for treatment of Syncope Functional/Safety concern: Patient had syncopal episode resulting in her falling and hitting her head  Goal:  Over the next 30 days, the patient will not experience hospital readmission  Interventions:  Transitions of Care: Doctor Visits  - discussed the importance of doctor visits  Diabetes Interventions: Assessed patient's understanding of A1c goal: <6.5% Reviewed medications with patient and discussed importance of medication adherence Discussed plans with patient for ongoing care management follow up and provided patient with direct contact information for care management team Reviewed scheduled/upcoming provider appointments including: PCP hospital follow up 05/02/24  Screening for signs and symptoms of depression related to chronic disease state  Assessed social determinant of health barriers Reviewed Integumentary: daughter states during hospitalization it was noticed the tips of her bilateral great toe were red and sensitive to touch and will discuss with PCP 05/03/23 - patient sees podiatrist    Reviewed CKD 3 and daughter reports patient has appointment tomorrow, 04/29/24 with hypertensive kidney specialist   Lab Results  Component Value Date   HGBA1C 8.4 (H) 04/26/2024    Falls Interventions: Reviewed medications and discussed potential side effects of medications such as dizziness and frequent urination Advised patient of importance of notifying provider of falls Assessed working status of life  alert bracelet and patient adherence- patient reports having diabetic alert bracelet and life alert necklace Patient confirmed Holter monitor for 2 weeks, follow EP clinic as scheduled. Zio to be mailed to patient - Dr Lawana Pray to read - will call with any questions   Patient Self Care Activities:  Attend all scheduled provider appointments Call pharmacy for medication refills 3-7 days in advance of running out of medications Call provider office for new concerns or questions  Notify RN Care Manager of [facility] call rescheduling needs Participate in Transition of Care Program/Attend TOC scheduled calls Take medications as prescribed   keep appointment with eye doctor check feet daily for cuts, sores or redness wash and dry feet carefully every day wear comfortable, cotton socks wear comfortable, well-fitting shoes  Plan:  Telephone follow up appointment with care management team member scheduled for:  05/01/24 3pm with Primary TOC RN Belton Boy  The patient has been provided with contact information for the care management team and has been advised to call with any health related questions or concerns.         Patient verbalizes understanding of instructions and care plan provided today and agrees to view in MyChart. Active MyChart status and patient understanding of how to access instructions and care plan via MyChart confirmed with patient.     Telephone follow up appointment with care management team member scheduled for: 05/01/24 with primary [facility] RN, Cecilie Coffee The patient has been provided with contact information for the care management team and has been advised to call with any health related questions or concerns.   Please call the care guide team at [phone]  if you need to cancel or reschedule your appointment.   Please call the Suicide and Crisis Lifeline: 988 call 1-800-273-TALK (toll free, 24 hour hotline) call 911 if you are experiencing a Mental Health or Behavioral  Health Crisis or need someone to talk to.  Tonia Frankel RN, CCM [place]  VBCI-Population Health RN Care Manager [phone]

## 2024-04-28 DIAGNOSIS — L89891 Pressure ulcer of other site, stage 1: Secondary | ICD-10-CM | POA: Diagnosis not present

## 2024-04-28 DIAGNOSIS — I10 Essential (primary) hypertension: Secondary | ICD-10-CM | POA: Diagnosis not present

## 2024-04-28 DIAGNOSIS — Z9181 History of falling: Secondary | ICD-10-CM | POA: Diagnosis not present

## 2024-04-28 DIAGNOSIS — E1122 Type 2 diabetes mellitus with diabetic chronic kidney disease: Secondary | ICD-10-CM | POA: Diagnosis not present

## 2024-04-28 DIAGNOSIS — Z7984 Long term (current) use of oral hypoglycemic drugs: Secondary | ICD-10-CM | POA: Diagnosis not present

## 2024-04-28 DIAGNOSIS — E785 Hyperlipidemia, unspecified: Secondary | ICD-10-CM | POA: Diagnosis not present

## 2024-04-28 DIAGNOSIS — I700 Atherosclerosis of aorta: Secondary | ICD-10-CM | POA: Diagnosis not present

## 2024-04-28 DIAGNOSIS — G2581 Restless legs syndrome: Secondary | ICD-10-CM | POA: Diagnosis not present

## 2024-04-28 DIAGNOSIS — N1832 Chronic kidney disease, stage 3b: Secondary | ICD-10-CM | POA: Diagnosis not present

## 2024-04-28 DIAGNOSIS — R339 Retention of urine, unspecified: Secondary | ICD-10-CM | POA: Diagnosis not present

## 2024-04-28 DIAGNOSIS — I129 Hypertensive chronic kidney disease with stage 1 through stage 4 chronic kidney disease, or unspecified chronic kidney disease: Secondary | ICD-10-CM | POA: Diagnosis not present

## 2024-04-28 DIAGNOSIS — E1159 Type 2 diabetes mellitus with other circulatory complications: Secondary | ICD-10-CM | POA: Diagnosis not present

## 2024-04-28 DIAGNOSIS — M47812 Spondylosis without myelopathy or radiculopathy, cervical region: Secondary | ICD-10-CM | POA: Diagnosis not present

## 2024-04-28 DIAGNOSIS — I152 Hypertension secondary to endocrine disorders: Secondary | ICD-10-CM | POA: Diagnosis not present

## 2024-04-28 DIAGNOSIS — E559 Vitamin D deficiency, unspecified: Secondary | ICD-10-CM | POA: Diagnosis not present

## 2024-04-28 DIAGNOSIS — H409 Unspecified glaucoma: Secondary | ICD-10-CM | POA: Diagnosis not present

## 2024-04-28 DIAGNOSIS — H04123 Dry eye syndrome of bilateral lacrimal glands: Secondary | ICD-10-CM | POA: Diagnosis not present

## 2024-04-28 DIAGNOSIS — K222 Esophageal obstruction: Secondary | ICD-10-CM | POA: Diagnosis not present

## 2024-04-28 DIAGNOSIS — Z7982 Long term (current) use of aspirin: Secondary | ICD-10-CM | POA: Diagnosis not present

## 2024-04-30 ENCOUNTER — Telehealth: Payer: Self-pay | Admitting: Family Medicine

## 2024-04-30 NOTE — Telephone Encounter (Signed)
 Verbal orders provided. LS

## 2024-04-30 NOTE — Telephone Encounter (Signed)
 Copied from CRM [id number]. Topic: Clinical - Home Health Verbal Orders >> Apr 30, 2024 10:09 AM Crispin Dolphin wrote: Caller/Agency: [facility] Number: [phone] Service Requested: Physical Therapy but would like to add Social worker, Nurse and Occupational Therapist  Frequency: PT - 1 time a week for 5 weeks Any new concerns about the patient? Yes - pressure sore foot (nurse) - vascular evaluation, vertigo

## 2024-05-01 ENCOUNTER — Other Ambulatory Visit: Payer: Self-pay | Admitting: *Deleted

## 2024-05-01 ENCOUNTER — Encounter: Payer: Self-pay | Admitting: *Deleted

## 2024-05-01 DIAGNOSIS — H409 Unspecified glaucoma: Secondary | ICD-10-CM | POA: Diagnosis not present

## 2024-05-01 DIAGNOSIS — K222 Esophageal obstruction: Secondary | ICD-10-CM | POA: Diagnosis not present

## 2024-05-01 DIAGNOSIS — R339 Retention of urine, unspecified: Secondary | ICD-10-CM | POA: Diagnosis not present

## 2024-05-01 DIAGNOSIS — I152 Hypertension secondary to endocrine disorders: Secondary | ICD-10-CM | POA: Diagnosis not present

## 2024-05-01 DIAGNOSIS — Z7982 Long term (current) use of aspirin: Secondary | ICD-10-CM | POA: Diagnosis not present

## 2024-05-01 DIAGNOSIS — E559 Vitamin D deficiency, unspecified: Secondary | ICD-10-CM | POA: Diagnosis not present

## 2024-05-01 DIAGNOSIS — E1159 Type 2 diabetes mellitus with other circulatory complications: Secondary | ICD-10-CM | POA: Diagnosis not present

## 2024-05-01 DIAGNOSIS — I700 Atherosclerosis of aorta: Secondary | ICD-10-CM | POA: Diagnosis not present

## 2024-05-01 DIAGNOSIS — I10 Essential (primary) hypertension: Secondary | ICD-10-CM | POA: Diagnosis not present

## 2024-05-01 DIAGNOSIS — E785 Hyperlipidemia, unspecified: Secondary | ICD-10-CM | POA: Diagnosis not present

## 2024-05-01 DIAGNOSIS — N1832 Chronic kidney disease, stage 3b: Secondary | ICD-10-CM | POA: Diagnosis not present

## 2024-05-01 DIAGNOSIS — E1122 Type 2 diabetes mellitus with diabetic chronic kidney disease: Secondary | ICD-10-CM | POA: Diagnosis not present

## 2024-05-01 DIAGNOSIS — G2581 Restless legs syndrome: Secondary | ICD-10-CM | POA: Diagnosis not present

## 2024-05-01 DIAGNOSIS — H04123 Dry eye syndrome of bilateral lacrimal glands: Secondary | ICD-10-CM | POA: Diagnosis not present

## 2024-05-01 DIAGNOSIS — M47812 Spondylosis without myelopathy or radiculopathy, cervical region: Secondary | ICD-10-CM | POA: Diagnosis not present

## 2024-05-01 DIAGNOSIS — L89891 Pressure ulcer of other site, stage 1: Secondary | ICD-10-CM | POA: Diagnosis not present

## 2024-05-01 DIAGNOSIS — Z7984 Long term (current) use of oral hypoglycemic drugs: Secondary | ICD-10-CM | POA: Diagnosis not present

## 2024-05-01 DIAGNOSIS — Z9181 History of falling: Secondary | ICD-10-CM | POA: Diagnosis not present

## 2024-05-01 NOTE — Patient Outreach (Signed)
 Transition of Care week 2  Visit Note  05/01/2024  Name: Candice Brewer MRN: [id number]          DOB: [birth year]  Situation: Patient enrolled in [facility] 30-day program. Visit completed with patient by telephone.   Background:    Past Medical History:  Diagnosis Date   Arthritis    Chest pain, unspecified    Constipation    DM (diabetes mellitus) (HCC)    Dry eyes    Esophageal stricture 09/2011   schatzki's ring   Fibroids    uterus   Glaucoma    Helicobacter pylori gastritis 2012   Treated   HTN (hypertension)    Hyperlipidemia    Restless legs syndrome (RLS)    Vitamin D  deficiency     Assessment: Patient Reported Symptoms: Cognitive Cognitive Status: Able to follow simple commands, Alert and oriented to person, place, and time, Normal speech and language skills   Health Maintenance Behaviors: Annual physical exam Healing Pattern: Unsure Health Facilitated by: Healthy diet, Rest  Neurological Neurological Review of Symptoms: Dizziness Neurological Management Strategies: Medication therapy Neurological Self-Management Outcome: 3 (uncertain) Neurological Comment: pt has intermittent dizziness, she is working with her doctor, taking meclizine , wearing heart monitor  HEENT HEENT Symptoms Reported: No symptoms reported HEENT Comment: left eye partial corneal implant 2024- follows with [facility]    Cardiovascular Cardiovascular Symptoms Reported: No symptoms reported Does patient have uncontrolled Hypertension?: No Cardiovascular Comment: denies dizziness at present, had dizziness this morning- taking meclinzine      wearing heart monitor  Respiratory Respiratory Symptoms Reported: No symptoms reported    Endocrine Patient reports the following symptoms related to hypoglycemia or hyperglycemia : No symptoms reported Is patient diabetic?: Yes Is patient checking blood sugars at home?: No (pt not interested) Endocrine Self-Management Outcome: 3 (uncertain)   Gastrointestinal Gastrointestinal Symptoms Reported: No symptoms reported   Nutrition Risk Screen (CP): No indicators present  Genitourinary Genitourinary Symptoms Reported: No symptoms reported    Integumentary Integumentary Symptoms Reported: No symptoms reported    Musculoskeletal Musculoskelatal Symptoms Reviewed: No symptoms reported        Psychosocial Psychosocial Symptoms Reported: No symptoms reported         There were no vitals filed for this visit.  Medications Reviewed Today     Reviewed by Daralyn Earl, RN (Registered Nurse) on 05/01/24 at 1504  Med List Status: <None>   Medication Order Taking? Sig Documenting Provider Last Dose Status Informant  aspirin  EC 81 MG tablet [id number] No Take 81 mg by mouth daily. [provider] Taking Active Self, Pharmacy Records, Other  brimonidine -timolol  (COMBIGAN ) 0.2-0.5 % ophthalmic solution [id number] No Place 1 drop into both eyes 2 (two) times daily.  [provider] Taking Active Self, Pharmacy Records, Other  cholecalciferol (VITAMIN D ) 1000 units tablet [id number] No Take 1,000 Units by mouth daily. [provider] Taking Active Self, Pharmacy Records, Other  empagliflozin  (JARDIANCE ) 10 MG TABS tablet [id number] No Take 10 mg by mouth daily. [provider] Taking Active   hydrochlorothiazide  (HYDRODIURIL ) 25 MG tablet [id number] No TAKE ONE TABLET EVERY DAY Rakes, Georgeann Kindred, FNP Taking Active Self, Pharmacy Records, Other  meclizine  (ANTIVERT ) 25 MG tablet [id number] No Take 1 tablet (25 mg total) by mouth 3 (three) times daily as needed for dizziness. Kommor, [place], MD Taking Active Self, Pharmacy Records, Other  Multiple Vitamin (MULTIVITAMIN WITH MINERALS) TABS tablet [id number] No Take 1 tablet by mouth daily. [provider] Taking Active  Self, Pharmacy Records, Other  OVER THE COUNTER MEDICATION [id number] No Apply 1 Application topically as needed (pain). "Amish gel" [provider] Taking Active Self, Pharmacy Records, Other  Polyethyl Glycol-Propyl Glycol (SYSTANE) 0.4-0.3 % GEL ophthalmic gel [id number] No Place 1 Application into both eyes at bedtime.  Patient not taking: Reported on 04/27/2024   [provider] Not Taking Active Self, Pharmacy Records, Other  Polyethyl Glycol-Propyl Glycol (SYSTANE) 0.4-0.3 % SOLN [id number] No Place 1 drop into both eyes as needed (dry eyes). [provider] Taking Active Self, Pharmacy Records, Other  valACYclovir  (VALTREX ) 1000 MG tablet [id number] No Take 1,000 mg by mouth 2 (two) times daily. Daughter reports patient taking 500mg  once daily - will discuss with PCP 05/02/24 [provider] Taking Active Self, Pharmacy Records, Other  VYZULTA  0.024 % SOLN [id number] No Apply 1 drop to eye at bedtime. [provider] Taking Active Self, Pharmacy Records, Other            Recommendation:   PCP Follow-up~05/02/24  Follow Up Plan:   Telephone follow-up 05/09/24 @ 1015 am  Cecilie Coffee [facility], BSN RN Care Manager/ Transition of Care [place]/ [facility] [phone]

## 2024-05-01 NOTE — Patient Instructions (Signed)
 Visit Information  Thank you for taking time to visit with me today. Please don't hesitate to contact me if I can be of assistance to you before our next scheduled telephone appointment.  Our next appointment is by telephone on 05/09/24 at 1015 am  Following is a copy of your care plan:   Goals Addressed             This Visit's Progress    [facility] Transitions of Care (TOC) Care Plan       Problems:  Recent Hospitalization for treatment of Syncope Functional/Safety concern: Hospitalization related to - Patient had syncopal episode resulting in her falling and hitting her head Patient states she has "some dizziness at times", varies throughout the day, no syncopal episodes since discharge from hospital, taking meclizine , pt states " this does help".  Pt is wearing heart monitor, pt states she does not check CBG, not interested, she does check blood pressure but is unable to state any readings  Goal:  Over the next 30 days, the patient will not experience hospital readmission  Interventions:  Transitions of Care: Doctor Visits  - discussed the importance of doctor visits  Diabetes Interventions: Assessed patient's understanding of A1c goal: <6.5% Reviewed medications with patient and discussed importance of medication adherence Counseled on importance of regular laboratory monitoring as prescribed Discussed plans with patient for ongoing care management follow up and provided patient with direct contact information for care management team Reviewed scheduled/upcoming provider appointments including: PCP hospital follow up 05/02/24  Reviewed Integumentary: daughter states during hospitalization it was noticed the tips of her bilateral great toe were red and sensitive to touch and will discuss with PCP 05/03/23 - patient sees podiatrist    Reviewed importance of not ambulating if dizzy, always asking for assistance as needed Reviewed importance of checking CBG regularly, pt states she has not  been checking and most likely will not  Lab Results  Component Value Date   HGBA1C 8.4 (H) 04/26/2024    Falls Interventions: Reviewed medications and discussed potential side effects of medications such as dizziness and frequent urination Advised patient of importance of notifying provider of falls Assessed working status of life alert bracelet and patient adherence- patient reports having diabetic alert bracelet and life alert necklace Patient confirmed Holter monitor for 2 weeks, follow EP clinic as scheduled. Zio to be mailed to patient - Dr Lawana Pray to read - will call with any questions  Reviewed safety precautions  Patient Self Care Activities:  Attend all scheduled provider appointments Call pharmacy for medication refills 3-7 days in advance of running out of medications Call provider office for new concerns or questions  Notify RN Care Manager of [facility] call rescheduling needs Participate in Transition of Care Program/Attend TOC scheduled calls Take medications as prescribed   keep appointment with eye doctor check feet daily for cuts, sores or redness wash and dry feet carefully every day wear comfortable, cotton socks wear comfortable, well-fitting shoes Do not ambulate if you are dizzy, always ask for help if needed Please consider checking your blood sugar at home  Plan:  Telephone follow up appointment with care management team member scheduled for:  05/09/24 @ 1015 am  The patient has been provided with contact information for the care management team and has been advised to call with any health related questions or concerns.         Patient verbalizes understanding of instructions and care plan provided today and agrees to view in MyChart.  Active MyChart status and patient understanding of how to access instructions and care plan via MyChart confirmed with patient.     Telephone follow up appointment with care management team member scheduled for: 05/09/24 @ 1015  am  Please call the care guide team at [phone] if you need to cancel or reschedule your appointment.   Please call the Suicide and Crisis Lifeline: 988 call the [place]  National Suicide Prevention Lifeline: 813-225-3911 or TTY: 845-144-7592 TTY (670)389-4446) to talk to a trained counselor call 1-800-273-TALK (toll free, 24 hour hotline) go to [facility] [address], [place] [phone]) call the [facility] Crisis Line: 303-246-0986 call 911 if you are experiencing a Mental Health or Behavioral Health Crisis or need someone to talk to.  Cecilie Coffee [facility], BSN RN Care Manager/ Transition of Care [place]/ [facility] [phone]

## 2024-05-02 ENCOUNTER — Encounter: Payer: Self-pay | Admitting: Family Medicine

## 2024-05-02 ENCOUNTER — Ambulatory Visit: Admitting: Family Medicine

## 2024-05-02 VITALS — BP 116/65 | HR 67 | Temp 96.2°F | Ht 66.0 in | Wt 128.8 lb

## 2024-05-02 DIAGNOSIS — M79605 Pain in left leg: Secondary | ICD-10-CM

## 2024-05-02 DIAGNOSIS — N183 Chronic kidney disease, stage 3 unspecified: Secondary | ICD-10-CM | POA: Diagnosis not present

## 2024-05-02 DIAGNOSIS — Z09 Encounter for follow-up examination after completed treatment for conditions other than malignant neoplasm: Secondary | ICD-10-CM

## 2024-05-02 DIAGNOSIS — E1122 Type 2 diabetes mellitus with diabetic chronic kidney disease: Secondary | ICD-10-CM | POA: Diagnosis not present

## 2024-05-02 DIAGNOSIS — E1169 Type 2 diabetes mellitus with other specified complication: Secondary | ICD-10-CM

## 2024-05-02 DIAGNOSIS — R55 Syncope and collapse: Secondary | ICD-10-CM

## 2024-05-02 DIAGNOSIS — E1159 Type 2 diabetes mellitus with other circulatory complications: Secondary | ICD-10-CM

## 2024-05-02 DIAGNOSIS — I152 Hypertension secondary to endocrine disorders: Secondary | ICD-10-CM

## 2024-05-02 LAB — CBC WITH DIFFERENTIAL/PLATELET
Basophils Absolute: 0 10*3/uL (ref 0.0–0.2)
Basos: 0 %
EOS (ABSOLUTE): 0.1 10*3/uL (ref 0.0–0.4)
Eos: 1 %
Hematocrit: 40.2 % (ref 34.0–46.6)
Hemoglobin: 12.7 g/dL (ref 11.1–15.9)
Immature Grans (Abs): 0 10*3/uL (ref 0.0–0.1)
Immature Granulocytes: 0 %
Lymphocytes Absolute: 1.4 10*3/uL (ref 0.7–3.1)
Lymphs: 15 %
MCH: 33.7 pg — ABNORMAL HIGH (ref 26.6–33.0)
MCHC: 31.6 g/dL (ref 31.5–35.7)
MCV: 107 fL — ABNORMAL HIGH (ref 79–97)
Monocytes Absolute: 0.5 10*3/uL (ref 0.1–0.9)
Monocytes: 6 %
Neutrophils Absolute: 7.3 10*3/uL — ABNORMAL HIGH (ref 1.4–7.0)
Neutrophils: 78 %
Platelets: 250 10*3/uL (ref 150–450)
RBC: 3.77 x10E6/uL (ref 3.77–5.28)
RDW: 12.3 % (ref 11.7–15.4)
WBC: 9.3 10*3/uL (ref 3.4–10.8)

## 2024-05-02 LAB — BMP8+EGFR
BUN/Creatinine Ratio: 38 — ABNORMAL HIGH (ref 12–28)
BUN: 57 mg/dL — ABNORMAL HIGH (ref 10–36)
CO2: 20 mmol/L (ref 20–29)
Calcium: 9.9 mg/dL (ref 8.7–10.3)
Chloride: 95 mmol/L — ABNORMAL LOW (ref 96–106)
Creatinine, Ser: 1.5 mg/dL — ABNORMAL HIGH (ref 0.57–1.00)
Glucose: 265 mg/dL — ABNORMAL HIGH (ref 70–99)
Potassium: 4.4 mmol/L (ref 3.5–5.2)
Sodium: 135 mmol/L (ref 134–144)
eGFR: 33 mL/min/{1.73_m2} — ABNORMAL LOW (ref >59)

## 2024-05-02 MED ORDER — FREESTYLE LIBRE 3 READER DEVI: 1.0000 | Freq: Four times a day (QID) | 11 refills | Status: DC

## 2024-05-02 MED ORDER — FREESTYLE LIBRE 3 SENSOR MISC: 1.0000 | Freq: Four times a day (QID) | 11 refills | Status: DC

## 2024-05-02 MED ORDER — GABAPENTIN 100 MG PO CAPS: 100.0000 mg | ORAL_CAPSULE | Freq: Every day | ORAL | 0 refills | Status: DC

## 2024-05-02 NOTE — Progress Notes (Signed)
 Subjective:  Patient ID: Candice Brewer, female    DOB: 1932/02/13, 89 y.o.   MRN: [id number]  Patient Care Team: Galvin Jules, FNP as PCP - General (Family Medicine) Daralyn Earl, RN as [facility] Care Management   Chief Complaint:  Hospitalization Follow-up (04/24/2024 - 04/26/2024 (2 days)/[facility]- syncope )   HPI: Candice Brewer is a 89 y.o. female presenting on 05/02/2024 for Hospitalization Follow-up (04/24/2024 - 04/26/2024 (2 days)/[facility]- syncope )   Candice Brewer is a 89 year old female who presents with dizziness and recent falls.  She experiences dizziness described as 'the ceiling is coming down and everything's running around,' leading to a fall in the bathroom. She had a blackout and was found by her grandson, who called emergency services. She was taken to the hospital, underwent cardiac evaluations and imaging, including a head CT, and was discharged with a heart monitor. Dizziness persists, especially with positional changes, but no further blackouts have occurred. She manages symptoms by sitting down when dizziness arises. Meclizine  was prescribed at urgent care prior to the blackout for dizziness.  She has chronic left leg pain, described as debilitating and not relieved by Tylenol  or topical Amish cream. Acupuncture and chiropractic sessions have not provided significant relief. The pain is described as 'terrible' and impacts daily activities.  She has experienced significant weight loss, from 141 pounds in November to 128 pounds currently, despite a good appetite. Her daughter assists with meals, ensuring increased protein intake. She uses protein powders.  She has a history of hypertension and was recently seen by a hypertensive kidney specialist who adjusted her medications, including the removal of enalapril . Her blood pressure has been stable since the adjustment.  She reports cold feet and redness in her  toes, with some improvement in circulation noted recently. She receives home health services, including nursing and physical therapy, to monitor her condition and assist with mobility.  She uses a walker for mobility and is considering a wheelchair for longer distances. Her daughter is involved in evaluating and adjusting her mobility aids to ensure safety.          Relevant past medical, surgical, family, and social history reviewed and updated as indicated.  Allergies and medications reviewed and updated. Data reviewed: Chart in Epic.   Past Medical History:  Diagnosis Date   Arthritis    Chest pain, unspecified    Constipation    DM (diabetes mellitus) (HCC)    Dry eyes    Esophageal stricture 09/2011   schatzki's ring   Fibroids    uterus   Glaucoma    Helicobacter pylori gastritis 2012   Treated   HTN (hypertension)    Hyperlipidemia    Restless legs syndrome (RLS)    Vitamin D  deficiency     Past Surgical History:  Procedure Laterality Date   APPENDECTOMY     BREAST BIOPSY Left 03/20/2018   Procedure: BREAST BIOPSY WITH NEEDLE LOCALIZATION;  Surgeon: Awilda Bogus, MD;  Location: [facility] ORS;  Service: General;  Laterality: Left;   BREAST EXCISIONAL BIOPSY Left 2019   Atypical hyperplasia removed   CARDIOVASCULAR STRESS TEST  2009   CATARACT EXTRACTION  2010   right eye   CATARACT EXTRACTION  2012   left eye   COLONOSCOPY  08/2006   Dr. Davonna Estes   COLONOSCOPY  10/13/2011   Normal. Next TCS 09/2016.   Procedure: COLONOSCOPY;  Surgeon: Windsor Hatcher  Rourk, MD;  Location: [facility] ENDO SUITE;  Service: Endoscopy;  Laterality: N/A;  8:15   COLONOSCOPY N/A 03/10/2015   RMR: External and anal canal hemorrhoids likely source of hematochezia. Redundant colon. Single sigmoid polyp removed ad described above.    ESOPHAGOGASTRODUODENOSCOPY  06/2008   Dr. Felicie Horning ring s/p disruption, erosive reflux esophagitis, hh.    ESOPHAGOGASTRODUODENOSCOPY  09/2011   Schatki's  ring s/p dilation, multiple 1-92mm antral and bulbar erosions, bx positive for H.Pylori s/ treatment   TUBAL LIGATION      Social History   Socioeconomic History   Marital status: Divorced    Spouse name: Not on file   Number of children: 5   Years of education: Not on file   Highest education level: Associate degree: occupational, Scientist, product/process development, or vocational program  Occupational History   Occupation: retired Psychologist, occupational  Tobacco Use   Smoking status: Never   Smokeless tobacco: Never   Tobacco comments:    quit about 35+ yrs  Vaping Use   Vaping status: Never Used  Substance and Sexual Activity   Alcohol  use: No    Alcohol /week: 0.0 standard drinks of alcohol    Drug use: No   Sexual activity: Not Currently  Other Topics Concern   Not on file  Social History Narrative   Not on file   Social Drivers of Health   Financial Resource Strain: Patient Declined (02/16/2024)   Overall Financial Resource Strain (CARDIA)    Difficulty of Paying Living Expenses: Patient declined  Food Insecurity: No Food Insecurity (04/27/2024)   Hunger Vital Sign    Worried About Running Out of Food in the Last Year: Never true    Ran Out of Food in the Last Year: Never true  Transportation Needs: No Transportation Needs (04/27/2024)   PRAPARE - Administrator, Civil Service (Medical): No    Lack of Transportation (Non-Medical): No  Physical Activity: Sufficiently Active (02/16/2024)   Exercise Vital Sign    Days of Exercise per Week: 7 days    Minutes of Exercise per Session: 60 min  Stress: No Stress Concern Present (02/16/2024)   [employer] of Occupational Health - Occupational Stress Questionnaire    Feeling of Stress : Not at all  Social Connections: Moderately Integrated (04/24/2024)   Social Connection and Isolation Panel [NHANES]    Frequency of Communication with Friends and Family: More than three times a week    Frequency of Social Gatherings with Friends and Family: More than  three times a week    Attends Religious Services: More than 4 times per year    Active Member of [employer] or Organizations: Yes    Attends Engineer, structural: More than 4 times per year    Marital Status: Divorced  Intimate Partner Violence: Not At Risk (04/27/2024)   Humiliation, Afraid, Rape, and Kick questionnaire    Fear of Current or Ex-Partner: No    Emotionally Abused: No    Physically Abused: No    Sexually Abused: No    Outpatient Encounter Medications as of 05/02/2024  Medication Sig   aspirin  EC 81 MG tablet Take 81 mg by mouth daily.   brimonidine -timolol  (COMBIGAN ) 0.2-0.5 % ophthalmic solution Place 1 drop into both eyes 2 (two) times daily.    cholecalciferol (VITAMIN D ) 1000 units tablet Take 1,000 Units by mouth daily.   Continuous Glucose Receiver (FREESTYLE LIBRE 3 READER) DEVI 1 Device by Does not apply route in the morning, at noon, in the evening,  and at bedtime.   Continuous Glucose Sensor (FREESTYLE LIBRE 3 SENSOR) MISC 1 Device by Does not apply route in the morning, at noon, in the evening, and at bedtime. Place 1 sensor on the skin every 14 days. Use to check glucose continuously   empagliflozin  (JARDIANCE ) 10 MG TABS tablet Take 10 mg by mouth daily.   gabapentin (NEURONTIN) 100 MG capsule Take 1 capsule (100 mg total) by mouth at bedtime.   hydrochlorothiazide  (HYDRODIURIL ) 25 MG tablet TAKE ONE TABLET EVERY DAY   meclizine  (ANTIVERT ) 25 MG tablet Take 1 tablet (25 mg total) by mouth 3 (three) times daily as needed for dizziness.   Multiple Vitamin (MULTIVITAMIN WITH MINERALS) TABS tablet Take 1 tablet by mouth daily.   OVER THE COUNTER MEDICATION Apply 1 Application topically as needed (pain). "Amish gel"   Polyethyl Glycol-Propyl Glycol (SYSTANE) 0.4-0.3 % SOLN Place 1 drop into both eyes as needed (dry eyes).   valACYclovir  (VALTREX ) 1000 MG tablet Take 500 mg by mouth daily. Daughter reports patient taking 500mg  once daily - will discuss with PCP  05/02/24   VYZULTA  0.024 % SOLN Apply 1 drop to eye at bedtime.   [DISCONTINUED] Polyethyl Glycol-Propyl Glycol (SYSTANE) 0.4-0.3 % GEL ophthalmic gel Place 1 Application into both eyes at bedtime.   No facility-administered encounter medications on file as of 05/02/2024.    Allergies  Allergen Reactions   Benzalkonium Other (See Comments)    Unknown    Benzalkonium Chloride Other (See Comments)    Unknown   Metformin And Related Other (See Comments)    Contraindicated due to chronic kidney disease GFR 30s   Neomy-Bacit-Polymyx-Pramoxine Rash   Neomycin-Bacitracin Zn-Polymyx Rash    "Severe skin rash"   Pravastatin Sodium Other (See Comments)    "Funny feeling and leg pain"   Rosuvastatin Rash    Pertinent ROS per HPI, otherwise unremarkable      Objective:  BP 116/65   Pulse 67   Temp (!) 96.2 F (35.7 C)   Ht 5\' 6"  (1.676 m)   Wt 128 lb 12.8 oz (58.4 kg)   SpO2 100%   BMI 20.79 kg/m    Wt Readings from Last 3 Encounters:  05/02/24 128 lb 12.8 oz (58.4 kg)  04/24/24 120 lb (54.4 kg)  04/22/24 133 lb 9.6 oz (60.6 kg)    Physical Exam Vitals and nursing note reviewed.  Constitutional:      General: She is not in acute distress.    Appearance: Normal appearance. She is not ill-appearing, toxic-appearing or diaphoretic.  HENT:     Head: Normocephalic and atraumatic.     Right Ear: Tympanic membrane, ear canal and external ear normal.     Left Ear: Tympanic membrane, ear canal and external ear normal.     Nose: Nose normal.     Mouth/Throat:     Mouth: Mucous membranes are moist.  Eyes:     Conjunctiva/sclera: Conjunctivae normal.     Pupils: Pupils are equal, round, and reactive to light.  Cardiovascular:     Rate and Rhythm: Normal rate and regular rhythm.     Pulses:          Dorsalis pedis pulses are 2+ on the right side and 2+ on the left side.       Posterior tibial pulses are 2+ on the right side and 2+ on the left side.     Heart sounds: Normal heart  sounds.  Pulmonary:     Effort: Pulmonary effort  is normal.     Breath sounds: Normal breath sounds.  Musculoskeletal:     Cervical back: Neck supple.     Lumbar back: Normal.     Left hip: Tenderness present. No deformity.     Left upper leg: Tenderness present. No deformity.     Left knee: Normal.     Right lower leg: No edema.     Left lower leg: No edema.       Feet:  Feet:     Right foot:     Skin integrity: No ulcer, blister, skin breakdown, erythema, warmth, callus, dry skin or fissure.     Left foot:     Skin integrity: Skin breakdown present. No ulcer, blister, erythema, warmth, callus, dry skin or fissure.  Skin:    General: Skin is warm and dry.     Capillary Refill: Capillary refill takes less than 2 seconds.  Neurological:     General: No focal deficit present.     Mental Status: She is alert and oriented to person, place, and time.     Cranial Nerves: No cranial nerve deficit.     Gait: Gait abnormal (using walker).  Psychiatric:        Mood and Affect: Mood normal.        Behavior: Behavior normal.        Thought Content: Thought content normal.        Judgment: Judgment normal.     Results for orders placed or performed during the hospital encounter of 04/24/24  Basic metabolic panel   Collection Time: 04/24/24 10:46 AM  Result Value Ref Range   Sodium 138 135 - 145 mmol/L   Potassium 4.6 3.5 - 5.1 mmol/L   Chloride 102 98 - 111 mmol/L   CO2 23 22 - 32 mmol/L   Glucose, Bld 221 (H) 70 - 99 mg/dL   BUN 37 (H) 8 - 23 mg/dL   Creatinine, Ser 8.41 (H) 0.44 - 1.00 mg/dL   Calcium 32.4 8.9 - 40.1 mg/dL   GFR, Estimated 34 (L) >60 mL/min   Anion gap 13 5 - 15  CBC   Collection Time: 04/24/24 10:46 AM  Result Value Ref Range   WBC 8.1 4.0 - 10.5 K/uL   RBC 3.95 3.87 - 5.11 MIL/uL   Hemoglobin 13.6 12.0 - 15.0 g/dL   HCT 02.7 25.3 - 66.4 %   MCV 102.3 (H) 80.0 - 100.0 fL   MCH 34.4 (H) 26.0 - 34.0 pg   MCHC 33.7 30.0 - 36.0 g/dL   RDW 40.3 47.4 - 25.9  %   Platelets 250 150 - 400 K/uL   nRBC 0.0 0.0 - 0.2 %  Troponin I (High Sensitivity)   Collection Time: 04/24/24 10:46 AM  Result Value Ref Range   Troponin I (High Sensitivity) 8 <18 ng/L  CBG monitoring, ED   Collection Time: 04/24/24 11:01 AM  Result Value Ref Range   Glucose-Capillary 196 (H) 70 - 99 mg/dL  TSH   Collection Time: 04/24/24 12:32 PM  Result Value Ref Range   TSH 1.574 0.350 - 4.500 uIU/mL  Magnesium    Collection Time: 04/24/24 12:32 PM  Result Value Ref Range   Magnesium  1.9 1.7 - 2.4 mg/dL  Troponin I (High Sensitivity)   Collection Time: 04/24/24 12:32 PM  Result Value Ref Range   Troponin I (High Sensitivity) 12 <18 ng/L  CBG monitoring, ED   Collection Time: 04/24/24  1:39 PM  Result Value Ref Range  Glucose-Capillary 202 (H) 70 - 99 mg/dL  Urinalysis, w/ Reflex to Culture (Infection Suspected) -Urine, Unspecified Source   Collection Time: 04/24/24  4:03 PM  Result Value Ref Range   Specimen Source URINE, UNSPE    Color, Urine STRAW (A) YELLOW   APPearance CLEAR CLEAR   Specific Gravity, Urine 1.014 1.005 - 1.030   pH 7.0 5.0 - 8.0   Glucose, UA >=500 (A) NEGATIVE mg/dL   Hgb urine dipstick NEGATIVE NEGATIVE   Bilirubin Urine NEGATIVE NEGATIVE   Ketones, ur NEGATIVE NEGATIVE mg/dL   Protein, ur NEGATIVE NEGATIVE mg/dL   Nitrite NEGATIVE NEGATIVE   Leukocytes,Ua NEGATIVE NEGATIVE   RBC / HPF 0-5 0 - 5 RBC/hpf   WBC, UA 0-5 0 - 5 WBC/hpf   Bacteria, UA NONE SEEN NONE SEEN   Squamous Epithelial / HPF 0-5 0 - 5 /HPF  CBG monitoring, ED   Collection Time: 04/24/24  8:00 PM  Result Value Ref Range   Glucose-Capillary 307 (H) 70 - 99 mg/dL  Glucose, capillary   Collection Time: 04/25/24 12:08 AM  Result Value Ref Range   Glucose-Capillary 176 (H) 70 - 99 mg/dL  Basic metabolic panel   Collection Time: 04/25/24  5:57 AM  Result Value Ref Range   Sodium 141 135 - 145 mmol/L   Potassium 3.6 3.5 - 5.1 mmol/L   Chloride 108 98 - 111 mmol/L    CO2 24 22 - 32 mmol/L   Glucose, Bld 136 (H) 70 - 99 mg/dL   BUN 31 (H) 8 - 23 mg/dL   Creatinine, Ser 9.93 (H) 0.44 - 1.00 mg/dL   Calcium 9.8 8.9 - 71.6 mg/dL   GFR, Estimated 36 (L) >60 mL/min   Anion gap 9 5 - 15  CBC   Collection Time: 04/25/24  5:57 AM  Result Value Ref Range   WBC 8.6 4.0 - 10.5 K/uL   RBC 3.48 (L) 3.87 - 5.11 MIL/uL   Hemoglobin 11.8 (L) 12.0 - 15.0 g/dL   HCT 96.7 (L) 89.3 - 81.0 %   MCV 101.7 (H) 80.0 - 100.0 fL   MCH 33.9 26.0 - 34.0 pg   MCHC 33.3 30.0 - 36.0 g/dL   RDW 17.5 10.2 - 58.5 %   Platelets 219 150 - 400 K/uL   nRBC 0.0 0.0 - 0.2 %  Glucose, capillary   Collection Time: 04/25/24  6:08 AM  Result Value Ref Range   Glucose-Capillary 141 (H) 70 - 99 mg/dL  MRSA Next Gen by PCR, Nasal   Collection Time: 04/25/24  9:57 AM   Specimen: Nasal Mucosa; Nasal Swab  Result Value Ref Range   MRSA by PCR Next Gen NOT DETECTED NOT DETECTED  Glucose, capillary   Collection Time: 04/25/24  1:02 PM  Result Value Ref Range   Glucose-Capillary 292 (H) 70 - 99 mg/dL  Glucose, capillary   Collection Time: 04/25/24  5:57 PM  Result Value Ref Range   Glucose-Capillary 124 (H) 70 - 99 mg/dL  Hemoglobin I7P   Collection Time: 04/26/24  7:26 AM  Result Value Ref Range   Hgb A1c MFr Bld 8.4 (H) 4.8 - 5.6 %   Mean Plasma Glucose 194.38 mg/dL  Glucose, capillary   Collection Time: 04/26/24  7:41 AM  Result Value Ref Range   Glucose-Capillary 140 (H) 70 - 99 mg/dL  ECHOCARDIOGRAM COMPLETE   Collection Time: 04/26/24 10:51 AM  Result Value Ref Range   Weight 1,920 oz   Height 66 in  BP 123/59 mmHg   Single Plane A2C EF 72.3 %   Single Plane A4C EF 70.1 %   Calc EF 70.2 %   S' Lateral 3.00 cm   AR max vel 2.34 cm2   AV Area VTI 2.61 cm2   AV Mean grad 2.0 mmHg   AV Peak grad 4.3 mmHg   Ao pk vel 1.04 m/s   Area-P 1/2 3.60 cm2   AV Area mean vel 2.38 cm2   Est EF 60 - 65%   Glucose, capillary   Collection Time: 04/26/24 12:12 PM  Result Value  Ref Range   Glucose-Capillary 194 (H) 70 - 99 mg/dL       Pertinent labs & imaging results that were available during my care of the patient were reviewed by me and considered in my medical decision making.  Assessment & Plan:  Sharaya was seen today for hospitalization follow-up.  Diagnoses and all orders for this visit:  Hospital discharge follow-up -     BMP8+EGFR -     CBC with Differential/Platelet -     DME Wheelchair manual  Syncope and collapse -     BMP8+EGFR -     CBC with Differential/Platelet -     Continuous Glucose Sensor (FREESTYLE LIBRE 3 SENSOR) MISC; 1 Device by Does not apply route in the morning, at noon, in the evening, and at bedtime. Place 1 sensor on the skin every 14 days. Use to check glucose continuously -     Continuous Glucose Receiver (FREESTYLE LIBRE 3 READER) DEVI; 1 Device by Does not apply route in the morning, at noon, in the evening, and at bedtime. -     DME Wheelchair manual  Type 2 diabetes mellitus with other specified complication, without long-term current use of insulin  (HCC) -     BMP8+EGFR -     CBC with Differential/Platelet -     Continuous Glucose Sensor (FREESTYLE LIBRE 3 SENSOR) MISC; 1 Device by Does not apply route in the morning, at noon, in the evening, and at bedtime. Place 1 sensor on the skin every 14 days. Use to check glucose continuously -     Continuous Glucose Receiver (FREESTYLE LIBRE 3 READER) DEVI; 1 Device by Does not apply route in the morning, at noon, in the evening, and at bedtime. -     DME Wheelchair manual  Hypertension associated with diabetes (HCC) -     BMP8+EGFR -     CBC with Differential/Platelet  CKD stage 3 due to type 2 diabetes mellitus (HCC) -     BMP8+EGFR -     CBC with Differential/Platelet  Pain of left lower extremity -     gabapentin (NEURONTIN) 100 MG capsule; Take 1 capsule (100 mg total) by mouth at bedtime. -     DME Wheelchair manual      Vertigo She experienced severe vertigo  leading to a fall and hospitalization, described as the ceiling coming down and everything spinning, with episodes of darkness and near-syncope. The vertigo mimics seizure-like activity during positional changes. A heart monitor is in place to evaluate for potential arrhythmias. Imaging, including a head CT, was unremarkable. Meclizine  has been prescribed for symptom management. Vertigo rehabilitation exercises were discussed as beneficial. Vertigo can be caused by crystal buildup in the vestibules, affecting equilibrium. - Continue using heart monitor until June 14 to evaluate for arrhythmias - Encourage vertigo rehabilitation exercises with physical therapy - Monitor symptoms and report any worsening or new episodes  Chronic Left Leg Pain She has chronic debilitating pain in the left leg, likely due to osteoarthritis, described as bone-on-bone. The pain is severe and not relieved by acetaminophen  or topical treatments. Gabapentin was discussed for nerve pain, with dizziness as a potential side effect. The plan is to start with a low dose at bedtime to minimize side effects. Gabapentin may help with vertigo but could also cause dizziness. - Prescribe gabapentin 100 mg at bedtime for 30 days - Consider over-the-counter Nervive for nerve pain - Monitor for side effects, especially dizziness, and discontinue if significant  Peripheral Circulation Issues She has peripheral circulation issues, evidenced by red and blistering toes, which have shown some improvement. She is under home health care for monitoring and management. Proper footwear is emphasized to prevent further issues. - Continue weekly home health nurse visits to monitor circulation - Ensure proper footwear to prevent pressure sores and blisters  Diabetes Management Her blood sugar levels need monitoring, especially in the context of dizziness episodes. She has difficulty with finger sticks due to poor circulation and arthritis. A  continuous glucose monitor was discussed as a solution for easier monitoring. - Order a continuous glucose monitor [facility]) for easier blood sugar monitoring - Monitor blood sugar levels regularly  Hypertension Her blood pressure is well-controlled after the removal of enalapril , with recent readings showing improvement. She has seen a hypertensive kidney specialist who adjusted her medications and provided education on the relationship between kidney function and blood pressure. - Continue monitoring blood pressure - Follow up with hypertensive kidney specialist as scheduled  Weight Loss She has experienced significant weight loss from 141 lbs in November to 128 lbs currently, despite a good appetite. Muscle mass loss due to aging is suspected. Increasing protein intake and caloric intake is recommended. No medications are identified as contributing to the weight loss. - Increase protein intake with meals - Consider protein supplements like Ensure or protein powders - Engage in light resistan ce exercises to maintain muscle mass  General Health Maintenance She is receiving home health services, including physical and occupational therapy, to assist with mobility and daily activities. Metamucil is advised for bowel health and cholesterol management. - Continue home health services with physical and occupational therapy - Use Metamucil as needed for bowel health  Follow-up She has several follow-up appointments and ongoing monitoring plans in place. - Follow up with cardiology on June 30 - Attend appointment with Dr. Abbey Abbe on June 5 - Recheck renal function and blood count with lab work - Order a wheelchair for mobility assistance      Today's visit was for Transitional Care Management.  The patient was discharged from [facility] on 04/26/2024 with a primary diagnosis of syncope.   Contact with the patient and/or caregiver, by a clinical staff member, was made on 04/27/2024 and was  documented as a telephone encounter within the EMR.  Through chart review and discussion with the patient I have determined that management of their condition is of high complexity.          Continue all other maintenance medications.  Follow up plan: Return in about 3 months (around 08/02/2024), or if symptoms worsen or fail to improve, for DM.   Continue healthy lifestyle choices, including diet (rich in fruits, vegetables, and lean proteins, and low in salt and simple carbohydrates) and exercise (at least 30 minutes of moderate physical activity daily).  Educational handout given for vertigo  The above assessment and management plan was discussed with  the patient. The patient verbalized understanding of and has agreed to the management plan. Patient is aware to call the clinic if they develop any new symptoms or if symptoms persist or worsen. Patient is aware when to return to the clinic for a follow-up visit. Patient educated on when it is appropriate to go to the emergency department.   Kattie Parrot, FNP-C [facility] [phone]

## 2024-05-03 ENCOUNTER — Ambulatory Visit: Payer: Self-pay | Admitting: Family Medicine

## 2024-05-04 DIAGNOSIS — Z7982 Long term (current) use of aspirin: Secondary | ICD-10-CM | POA: Diagnosis not present

## 2024-05-04 DIAGNOSIS — G2581 Restless legs syndrome: Secondary | ICD-10-CM | POA: Diagnosis not present

## 2024-05-04 DIAGNOSIS — L89891 Pressure ulcer of other site, stage 1: Secondary | ICD-10-CM | POA: Diagnosis not present

## 2024-05-04 DIAGNOSIS — I10 Essential (primary) hypertension: Secondary | ICD-10-CM | POA: Diagnosis not present

## 2024-05-04 DIAGNOSIS — E785 Hyperlipidemia, unspecified: Secondary | ICD-10-CM | POA: Diagnosis not present

## 2024-05-04 DIAGNOSIS — Z7984 Long term (current) use of oral hypoglycemic drugs: Secondary | ICD-10-CM | POA: Diagnosis not present

## 2024-05-04 DIAGNOSIS — E1159 Type 2 diabetes mellitus with other circulatory complications: Secondary | ICD-10-CM | POA: Diagnosis not present

## 2024-05-04 DIAGNOSIS — K222 Esophageal obstruction: Secondary | ICD-10-CM | POA: Diagnosis not present

## 2024-05-04 DIAGNOSIS — E1122 Type 2 diabetes mellitus with diabetic chronic kidney disease: Secondary | ICD-10-CM | POA: Diagnosis not present

## 2024-05-04 DIAGNOSIS — Z9181 History of falling: Secondary | ICD-10-CM | POA: Diagnosis not present

## 2024-05-04 DIAGNOSIS — N1832 Chronic kidney disease, stage 3b: Secondary | ICD-10-CM | POA: Diagnosis not present

## 2024-05-04 DIAGNOSIS — E559 Vitamin D deficiency, unspecified: Secondary | ICD-10-CM | POA: Diagnosis not present

## 2024-05-04 DIAGNOSIS — I152 Hypertension secondary to endocrine disorders: Secondary | ICD-10-CM | POA: Diagnosis not present

## 2024-05-04 DIAGNOSIS — H409 Unspecified glaucoma: Secondary | ICD-10-CM | POA: Diagnosis not present

## 2024-05-04 DIAGNOSIS — I700 Atherosclerosis of aorta: Secondary | ICD-10-CM | POA: Diagnosis not present

## 2024-05-04 DIAGNOSIS — R339 Retention of urine, unspecified: Secondary | ICD-10-CM | POA: Diagnosis not present

## 2024-05-04 DIAGNOSIS — M47812 Spondylosis without myelopathy or radiculopathy, cervical region: Secondary | ICD-10-CM | POA: Diagnosis not present

## 2024-05-04 DIAGNOSIS — H04123 Dry eye syndrome of bilateral lacrimal glands: Secondary | ICD-10-CM | POA: Diagnosis not present

## 2024-05-07 ENCOUNTER — Telehealth: Payer: Self-pay

## 2024-05-07 ENCOUNTER — Other Ambulatory Visit (HOSPITAL_COMMUNITY): Payer: Self-pay

## 2024-05-07 NOTE — Telephone Encounter (Signed)
 Pharmacy Patient Advocate Encounter   Received notification from CoverMyMeds that prior authorization for FreeStyle Libre 3 Reader device  is required/requested.   Insurance verification completed.   The patient is insured through [facility] .   Per test claim: PA required; PA submitted to above mentioned insurance via CoverMyMeds Key/confirmation #/EOC [id number] Status is pending

## 2024-05-07 NOTE — Telephone Encounter (Signed)
 Pharmacy Patient Advocate Encounter  Received notification from [employer] that Prior Authorization for  FreeStyle Arthur 3 Reader device  has been DENIED.  Full denial letter will be uploaded to the media tab. See denial reason below.    PA #/Case ID/Reference #: [id number]

## 2024-05-08 ENCOUNTER — Telehealth: Payer: Self-pay | Admitting: Family Medicine

## 2024-05-08 DIAGNOSIS — E1159 Type 2 diabetes mellitus with other circulatory complications: Secondary | ICD-10-CM | POA: Diagnosis not present

## 2024-05-08 DIAGNOSIS — M47812 Spondylosis without myelopathy or radiculopathy, cervical region: Secondary | ICD-10-CM | POA: Diagnosis not present

## 2024-05-08 DIAGNOSIS — G2581 Restless legs syndrome: Secondary | ICD-10-CM | POA: Diagnosis not present

## 2024-05-08 DIAGNOSIS — L89891 Pressure ulcer of other site, stage 1: Secondary | ICD-10-CM | POA: Diagnosis not present

## 2024-05-08 DIAGNOSIS — N1832 Chronic kidney disease, stage 3b: Secondary | ICD-10-CM | POA: Diagnosis not present

## 2024-05-08 DIAGNOSIS — K222 Esophageal obstruction: Secondary | ICD-10-CM | POA: Diagnosis not present

## 2024-05-08 DIAGNOSIS — E1122 Type 2 diabetes mellitus with diabetic chronic kidney disease: Secondary | ICD-10-CM | POA: Diagnosis not present

## 2024-05-08 DIAGNOSIS — H409 Unspecified glaucoma: Secondary | ICD-10-CM | POA: Diagnosis not present

## 2024-05-08 DIAGNOSIS — I700 Atherosclerosis of aorta: Secondary | ICD-10-CM | POA: Diagnosis not present

## 2024-05-08 DIAGNOSIS — Z7982 Long term (current) use of aspirin: Secondary | ICD-10-CM | POA: Diagnosis not present

## 2024-05-08 DIAGNOSIS — E785 Hyperlipidemia, unspecified: Secondary | ICD-10-CM | POA: Diagnosis not present

## 2024-05-08 DIAGNOSIS — I10 Essential (primary) hypertension: Secondary | ICD-10-CM | POA: Diagnosis not present

## 2024-05-08 DIAGNOSIS — Z7984 Long term (current) use of oral hypoglycemic drugs: Secondary | ICD-10-CM | POA: Diagnosis not present

## 2024-05-08 DIAGNOSIS — E559 Vitamin D deficiency, unspecified: Secondary | ICD-10-CM | POA: Diagnosis not present

## 2024-05-08 DIAGNOSIS — R339 Retention of urine, unspecified: Secondary | ICD-10-CM | POA: Diagnosis not present

## 2024-05-08 DIAGNOSIS — H04123 Dry eye syndrome of bilateral lacrimal glands: Secondary | ICD-10-CM | POA: Diagnosis not present

## 2024-05-08 DIAGNOSIS — I152 Hypertension secondary to endocrine disorders: Secondary | ICD-10-CM | POA: Diagnosis not present

## 2024-05-08 DIAGNOSIS — Z9181 History of falling: Secondary | ICD-10-CM | POA: Diagnosis not present

## 2024-05-08 NOTE — Telephone Encounter (Signed)
 Patient aware.

## 2024-05-08 NOTE — Telephone Encounter (Signed)
 Copied from CRM [id number]. Topic: Clinical - Home Health Verbal Orders >> May 08, 2024 10:41 AM [name] wrote: Caller/Agency: Tomas Fountain Callback Number: [phone]  Service Requested: N/A Frequency: N/A Any new concerns about the patient? Just finished evaluation with the patient, she does not need further occupational therapy. She is independent. Evaluation only.

## 2024-05-09 ENCOUNTER — Other Ambulatory Visit: Payer: Self-pay | Admitting: *Deleted

## 2024-05-09 DIAGNOSIS — Z9181 History of falling: Secondary | ICD-10-CM | POA: Diagnosis not present

## 2024-05-09 DIAGNOSIS — H409 Unspecified glaucoma: Secondary | ICD-10-CM | POA: Diagnosis not present

## 2024-05-09 DIAGNOSIS — E1122 Type 2 diabetes mellitus with diabetic chronic kidney disease: Secondary | ICD-10-CM | POA: Diagnosis not present

## 2024-05-09 DIAGNOSIS — I700 Atherosclerosis of aorta: Secondary | ICD-10-CM | POA: Diagnosis not present

## 2024-05-09 DIAGNOSIS — G2581 Restless legs syndrome: Secondary | ICD-10-CM | POA: Diagnosis not present

## 2024-05-09 DIAGNOSIS — L89891 Pressure ulcer of other site, stage 1: Secondary | ICD-10-CM | POA: Diagnosis not present

## 2024-05-09 DIAGNOSIS — E1159 Type 2 diabetes mellitus with other circulatory complications: Secondary | ICD-10-CM | POA: Diagnosis not present

## 2024-05-09 DIAGNOSIS — I10 Essential (primary) hypertension: Secondary | ICD-10-CM | POA: Diagnosis not present

## 2024-05-09 DIAGNOSIS — Z7982 Long term (current) use of aspirin: Secondary | ICD-10-CM | POA: Diagnosis not present

## 2024-05-09 DIAGNOSIS — E785 Hyperlipidemia, unspecified: Secondary | ICD-10-CM | POA: Diagnosis not present

## 2024-05-09 DIAGNOSIS — E559 Vitamin D deficiency, unspecified: Secondary | ICD-10-CM | POA: Diagnosis not present

## 2024-05-09 DIAGNOSIS — M47812 Spondylosis without myelopathy or radiculopathy, cervical region: Secondary | ICD-10-CM | POA: Diagnosis not present

## 2024-05-09 DIAGNOSIS — R339 Retention of urine, unspecified: Secondary | ICD-10-CM | POA: Diagnosis not present

## 2024-05-09 DIAGNOSIS — N1832 Chronic kidney disease, stage 3b: Secondary | ICD-10-CM | POA: Diagnosis not present

## 2024-05-09 DIAGNOSIS — K222 Esophageal obstruction: Secondary | ICD-10-CM | POA: Diagnosis not present

## 2024-05-09 DIAGNOSIS — Z7984 Long term (current) use of oral hypoglycemic drugs: Secondary | ICD-10-CM | POA: Diagnosis not present

## 2024-05-09 DIAGNOSIS — I152 Hypertension secondary to endocrine disorders: Secondary | ICD-10-CM | POA: Diagnosis not present

## 2024-05-09 DIAGNOSIS — H04123 Dry eye syndrome of bilateral lacrimal glands: Secondary | ICD-10-CM | POA: Diagnosis not present

## 2024-05-09 NOTE — Patient Outreach (Signed)
 Transition of Care week 3  Visit Note  05/09/2024  Name: Candice Brewer MRN: [id number]          DOB: August 04, 1932  Situation: Patient enrolled in [facility] 30-day program. Visit completed with patient by telephone.   Background:    Past Medical History:  Diagnosis Date   Arthritis    Chest pain, unspecified    Constipation    DM (diabetes mellitus) (HCC)    Dry eyes    Esophageal stricture 09/2011   schatzki's ring   Fibroids    uterus   Glaucoma    Helicobacter pylori gastritis 2012   Treated   HTN (hypertension)    Hyperlipidemia    Restless legs syndrome (RLS)    Vitamin D  deficiency     Assessment: Patient Reported Symptoms: Cognitive Cognitive Status: Able to follow simple commands, Alert and oriented to person, place, and time, Normal speech and language skills      Neurological Neurological Review of Symptoms: Dizziness Neurological Management Strategies: Medication therapy, Routine screening Neurological Self-Management Outcome: 3 (uncertain) Neurological Comment: pt continues to have intermittent dizziness, she continues working closely with her provider, wearing heart monitor  HEENT HEENT Symptoms Reported: No symptoms reported      Cardiovascular Cardiovascular Symptoms Reported: No symptoms reported Does patient have uncontrolled Hypertension?: No    Respiratory Respiratory Symptoms Reported: No symptoms reported    Endocrine Patient reports the following symptoms related to hypoglycemia or hyperglycemia : No symptoms reported Is patient diabetic?: Yes Is patient checking blood sugars at home?: No (pt states due to arthritis she is unable to check CBG) Endocrine Self-Management Outcome: 3 (uncertain) Endocrine Comment: Pt in process of getting Freestyle Libre 3, insurance will not reimburse because pt is not on insulin , pt states she is discussing with family and may pay out of pocket  Gastrointestinal Gastrointestinal Symptoms Reported: No symptoms  reported      Genitourinary Genitourinary Symptoms Reported: No symptoms reported    Integumentary Integumentary Symptoms Reported: No symptoms reported    Musculoskeletal Musculoskelatal Symptoms Reviewed: No symptoms reported        Psychosocial Psychosocial Symptoms Reported: No symptoms reported         There were no vitals filed for this visit.  Medications Reviewed Today     Reviewed by Daralyn Earl, RN (Registered Nurse) on 05/09/24 at 1030  Med List Status: <None>   Medication Order Taking? Sig Documenting Provider Last Dose Status Informant  aspirin  EC 81 MG tablet [id number] No Take 81 mg by mouth daily. [provider] Taking Active Self, Pharmacy Records, Other  brimonidine -timolol  (COMBIGAN ) 0.2-0.5 % ophthalmic solution [id number] No Place 1 drop into both eyes 2 (two) times daily.  [provider] Taking Active Self, Pharmacy Records, Other  cholecalciferol (VITAMIN D ) 1000 units tablet [id number] No Take 1,000 Units by mouth daily. [provider] Taking Active Self, Pharmacy Records, Other  Continuous Glucose Receiver (FREESTYLE LIBRE 3 READER) DEVI [id number]  1 Device by Does not apply route in the morning, at noon, in the evening, and at bedtime. Galvin Jules, FNP  Active   Continuous Glucose Sensor (FREESTYLE LIBRE 3 SENSOR) [place] [id number]  1 Device by Does not apply route in the morning, at noon, in the evening, and at bedtime. Place 1 sensor on the skin every 14 days. Use to check glucose continuously Galvin Jules, FNP  Active   empagliflozin  (JARDIANCE ) 10 MG TABS tablet [id number] No Take 10 mg by mouth daily.  [provider] Taking Active   gabapentin  (NEURONTIN ) 100 MG capsule [id number]  Take 1 capsule (100 mg total) by mouth at bedtime. Galvin Jules, FNP  Active   hydrochlorothiazide  (HYDRODIURIL ) 25 MG tablet [id number] No TAKE ONE TABLET EVERY DAY Rakes, Georgeann Kindred, FNP Taking Active Self, Pharmacy Records, Other   meclizine  (ANTIVERT ) 25 MG tablet [id number] No Take 1 tablet (25 mg total) by mouth 3 (three) times daily as needed for dizziness. Kommor, [place], MD Taking Active Self, Pharmacy Records, Other  Multiple Vitamin (MULTIVITAMIN WITH MINERALS) TABS tablet [id number] No Take 1 tablet by mouth daily. [provider] Taking Active Self, Pharmacy Records, Other  OVER THE COUNTER MEDICATION [id number] No Apply 1 Application topically as needed (pain). Amish gel [provider] Taking Active Self, Pharmacy Records, Other  Polyethyl Glycol-Propyl Glycol (SYSTANE) 0.4-0.3 % SOLN [id number] No Place 1 drop into both eyes as needed (dry eyes). [provider] Taking Active Self, Pharmacy Records, Other  valACYclovir  (VALTREX ) 1000 MG tablet [id number] No Take 500 mg by mouth daily. Daughter reports patient taking 500mg  once daily - will discuss with PCP 05/02/24 [provider] Taking Active Self, Pharmacy Records, Other  VYZULTA  0.024 % SOLN [id number] No Apply 1 drop to eye at bedtime. [provider] Taking Active Self, Pharmacy Records, Other            Goals Addressed             This Visit's Progress    VBCI Transitions of Care (TOC) Care Plan       Problems:  Recent Hospitalization for treatment of Syncope Functional/Safety concern: Hospitalization related to - Patient had syncopal episode resulting in her falling and hitting her head Patient states she has some dizziness at times, varies throughout the day, no syncopal episodes since discharge from hospital, taking meclizine , pt states  this does help.  Pt is wearing heart monitor, pt states she does not check CBG, previously not interested and has arthritis making it difficult, may pay out of pocket for Freestyle Libre 3 due to insurance will not reimburse because pt is not on insulin , she does check blood pressure with today's reading 142/78, pt states she can start checking orthostatic BP and will  ask PT to assist when they see her in the home, pt is working with home health PT/ OT  Goal:  Over the next 30 days, the patient will not experience hospital readmission  Interventions:  Transitions of Care: Doctor Visits  - discussed the importance of doctor visits  Diabetes Interventions: Assessed patient's understanding of A1c goal: <6.5% Reviewed medications with patient and discussed importance of medication adherence Counseled on importance of regular laboratory monitoring as prescribed Discussed plans with patient for ongoing care management follow up and provided patient with direct contact information for care management team Reviewed scheduled/upcoming provider appointments including: Primary care provider   Reinforced importance of not ambulating if dizzy, always asking for assistance as needed Reviewed importance of checking CBG regularly, pt states she has not been checking and most likely will not  Lab Results  Component Value Date   HGBA1C 8.4 (H) 04/26/2024    Falls Interventions: Reviewed medications and discussed potential side effects of medications such as dizziness and frequent urination Advised patient of importance of notifying provider of falls Assessed working status of life alert bracelet and patient adherence- patient reports having diabetic alert bracelet and life alert necklace Patient confirmed Holter monitor for 2 weeks, follow EP clinic as  scheduled. Zio to be mailed to patient - Dr Lawana Pray to read - will call with any questions  Reinforced safety precautions Reviewed checking orthostatic BP and recording  Patient Self Care Activities:  Attend all scheduled provider appointments Call pharmacy for medication refills 3-7 days in advance of running out of medications Call provider office for new concerns or questions  Notify RN Care Manager of [facility] call rescheduling needs Participate in Transition of Care Program/Attend TOC scheduled calls Take  medications as prescribed   keep appointment with eye doctor check feet daily for cuts, sores or redness wash and dry feet carefully every day wear comfortable, cotton socks wear comfortable, well-fitting shoes Do not ambulate if you are dizzy, always ask for help if needed Please consider checking your blood sugar at home Please check your blood pressure sitting and standing and keep a log  Plan:  Telephone follow up appointment with care management team member scheduled for:  05/16/24 @ 1015 with Katheryn Pandy RN  The patient has been provided with contact information for the care management team and has been advised to call with any health related questions or concerns.         Recommendation:   PCP Follow-up  Follow Up Plan:   Telephone follow-up 05/16/24 @ 1015 with Katheryn Pandy RN  Cecilie Coffee [employer], BSN RN Care Manager/ Transition of Care / [facility] [phone]

## 2024-05-09 NOTE — Patient Instructions (Signed)
 Visit Information  Thank you for taking time to visit with me today. Please don't hesitate to contact me if I can be of assistance to you before our next scheduled telephone appointment.  Our next appointment is by telephone on 05/16/24 @ 1015 am Following is a copy of your care plan:   Goals Addressed             This Visit's Progress    VBCI Transitions of Care (TOC) Care Plan       Problems:  Recent Hospitalization for treatment of Syncope Functional/Safety concern: Hospitalization related to - Patient had syncopal episode resulting in her falling and hitting her head Patient states she has some dizziness at times, varies throughout the day, no syncopal episodes since discharge from hospital, taking meclizine , pt states  this does help.  Pt is wearing heart monitor, pt states she does not check CBG, previously not interested and has arthritis making it difficult, may pay out of pocket for Freestyle Libre 3 due to insurance will not reimburse because pt is not on insulin , she does check blood pressure with today's reading 142/78, pt states she can start checking orthostatic BP and will ask PT to assist when they see her in the home, pt is working with home health PT/ OT  Goal:  Over the next 30 days, the patient will not experience hospital readmission  Interventions:  Transitions of Care: Doctor Visits  - discussed the importance of doctor visits  Diabetes Interventions: Assessed patient's understanding of A1c goal: <6.5% Reviewed medications with patient and discussed importance of medication adherence Counseled on importance of regular laboratory monitoring as prescribed Discussed plans with patient for ongoing care management follow up and provided patient with direct contact information for care management team Reviewed scheduled/upcoming provider appointments including: Primary care provider   Reinforced importance of not ambulating if dizzy, always asking for assistance as  needed Reviewed importance of checking CBG regularly, pt states she has not been checking and most likely will not  Lab Results  Component Value Date   HGBA1C 8.4 (H) 04/26/2024    Falls Interventions: Reviewed medications and discussed potential side effects of medications such as dizziness and frequent urination Advised patient of importance of notifying provider of falls Assessed working status of life alert bracelet and patient adherence- patient reports having diabetic alert bracelet and life alert necklace Patient confirmed Holter monitor for 2 weeks, follow EP clinic as scheduled. Zio to be mailed to patient - Dr Lawana Pray to read - will call with any questions  Reinforced safety precautions Reviewed checking orthostatic BP and recording  Patient Self Care Activities:  Attend all scheduled provider appointments Call pharmacy for medication refills 3-7 days in advance of running out of medications Call provider office for new concerns or questions  Notify RN Care Manager of [facility] call rescheduling needs Participate in Transition of Care Program/Attend TOC scheduled calls Take medications as prescribed   keep appointment with eye doctor check feet daily for cuts, sores or redness wash and dry feet carefully every day wear comfortable, cotton socks wear comfortable, well-fitting shoes Do not ambulate if you are dizzy, always ask for help if needed Please consider checking your blood sugar at home Please check your blood pressure sitting and standing and keep a log  Plan:  Telephone follow up appointment with care management team member scheduled for:  05/16/24 @ 1015 with Katheryn Pandy RN  The patient has been provided with contact information for the care management  team and has been advised to call with any health related questions or concerns.         Patient verbalizes understanding of instructions and care plan provided today and agrees to view in MyChart. Active MyChart  status and patient understanding of how to access instructions and care plan via MyChart confirmed with patient.     Telephone follow up appointment with care management team member scheduled for: 05/16/24 @ 1015 am  Please call the care guide team at [phone] if you need to cancel or reschedule your appointment.   Please call the Suicide and Crisis Lifeline: 988 call the [place]  National Suicide Prevention Lifeline: 239-647-9583 or TTY: 351-135-1125 TTY 413-379-9304) to talk to a trained counselor call 1-800-273-TALK (toll free, 24 hour hotline) go to [facility] [address], [place] ([phone]) call the [facility] Crisis Line: (780) [phone] call 911 if you are experiencing a Mental Health or Behavioral Health Crisis or need someone to talk to.  Cecilie Coffee [facility], BSN RN Care Manager/ Transition of Care [place]/ [facility] [phone]

## 2024-05-10 ENCOUNTER — Other Ambulatory Visit: Payer: Self-pay | Admitting: Family Medicine

## 2024-05-10 ENCOUNTER — Ambulatory Visit

## 2024-05-10 ENCOUNTER — Telehealth: Payer: Self-pay

## 2024-05-10 DIAGNOSIS — E559 Vitamin D deficiency, unspecified: Secondary | ICD-10-CM

## 2024-05-10 DIAGNOSIS — Z7982 Long term (current) use of aspirin: Secondary | ICD-10-CM | POA: Diagnosis not present

## 2024-05-10 DIAGNOSIS — L89891 Pressure ulcer of other site, stage 1: Secondary | ICD-10-CM | POA: Diagnosis not present

## 2024-05-10 DIAGNOSIS — M79675 Pain in left toe(s): Secondary | ICD-10-CM | POA: Diagnosis not present

## 2024-05-10 DIAGNOSIS — E1122 Type 2 diabetes mellitus with diabetic chronic kidney disease: Secondary | ICD-10-CM | POA: Diagnosis not present

## 2024-05-10 DIAGNOSIS — Z7984 Long term (current) use of oral hypoglycemic drugs: Secondary | ICD-10-CM | POA: Diagnosis not present

## 2024-05-10 DIAGNOSIS — I152 Hypertension secondary to endocrine disorders: Secondary | ICD-10-CM

## 2024-05-10 DIAGNOSIS — Z9181 History of falling: Secondary | ICD-10-CM | POA: Diagnosis not present

## 2024-05-10 DIAGNOSIS — K222 Esophageal obstruction: Secondary | ICD-10-CM | POA: Diagnosis not present

## 2024-05-10 DIAGNOSIS — R339 Retention of urine, unspecified: Secondary | ICD-10-CM | POA: Diagnosis not present

## 2024-05-10 DIAGNOSIS — N1832 Chronic kidney disease, stage 3b: Secondary | ICD-10-CM | POA: Diagnosis not present

## 2024-05-10 DIAGNOSIS — E785 Hyperlipidemia, unspecified: Secondary | ICD-10-CM | POA: Diagnosis not present

## 2024-05-10 DIAGNOSIS — M47812 Spondylosis without myelopathy or radiculopathy, cervical region: Secondary | ICD-10-CM | POA: Diagnosis not present

## 2024-05-10 DIAGNOSIS — B351 Tinea unguium: Secondary | ICD-10-CM | POA: Diagnosis not present

## 2024-05-10 DIAGNOSIS — M79674 Pain in right toe(s): Secondary | ICD-10-CM | POA: Diagnosis not present

## 2024-05-10 DIAGNOSIS — I700 Atherosclerosis of aorta: Secondary | ICD-10-CM | POA: Diagnosis not present

## 2024-05-10 DIAGNOSIS — L84 Corns and callosities: Secondary | ICD-10-CM | POA: Diagnosis not present

## 2024-05-10 DIAGNOSIS — E1142 Type 2 diabetes mellitus with diabetic polyneuropathy: Secondary | ICD-10-CM | POA: Diagnosis not present

## 2024-05-10 DIAGNOSIS — H04123 Dry eye syndrome of bilateral lacrimal glands: Secondary | ICD-10-CM | POA: Diagnosis not present

## 2024-05-10 DIAGNOSIS — E1159 Type 2 diabetes mellitus with other circulatory complications: Secondary | ICD-10-CM

## 2024-05-10 DIAGNOSIS — G2581 Restless legs syndrome: Secondary | ICD-10-CM

## 2024-05-10 DIAGNOSIS — H409 Unspecified glaucoma: Secondary | ICD-10-CM | POA: Diagnosis not present

## 2024-05-10 DIAGNOSIS — I10 Essential (primary) hypertension: Secondary | ICD-10-CM | POA: Diagnosis not present

## 2024-05-10 NOTE — Telephone Encounter (Deleted)
 sent

## 2024-05-10 NOTE — Telephone Encounter (Signed)
 Closing call - this was took care in another encounter regarding med refills

## 2024-05-10 NOTE — Telephone Encounter (Signed)
 Copied from CRM [id number]. Topic: Clinical - Medication Refill >> May 10, 2024 12:38 PM Sophia H wrote: Medication: meclizine  (ANTIVERT ) 25 MG tablet **Medication was prescribed in the ER for dizziness and daughter is calling in on behalf of the patient to see if FNP Evalyn Hillier is able to fill it for her.   Has the patient contacted their pharmacy? Yes   This is the patient's preferred pharmacy:  [facility] [place], [place] - [address] [place] [address] Phone: [phone] Fax: [phone]  Is this the correct pharmacy for this prescription? Yes If no, delete pharmacy and type the correct one.   Has the prescription been filled recently? Yes  Is the patient out of the medication? Yes  Has the patient been seen for an appointment in the last year OR does the patient have an upcoming appointment? Yes  Can we respond through MyChart? Yes  Agent: Please be advised that Rx refills may take up to 3 business days. We ask that you follow-up with your pharmacy.

## 2024-05-15 DIAGNOSIS — H04122 Dry eye syndrome of left lacrimal gland: Secondary | ICD-10-CM | POA: Diagnosis not present

## 2024-05-15 DIAGNOSIS — H401131 Primary open-angle glaucoma, bilateral, mild stage: Secondary | ICD-10-CM | POA: Diagnosis not present

## 2024-05-16 ENCOUNTER — Other Ambulatory Visit: Payer: Self-pay

## 2024-05-16 DIAGNOSIS — I152 Hypertension secondary to endocrine disorders: Secondary | ICD-10-CM | POA: Diagnosis not present

## 2024-05-16 DIAGNOSIS — R339 Retention of urine, unspecified: Secondary | ICD-10-CM | POA: Diagnosis not present

## 2024-05-16 DIAGNOSIS — N1832 Chronic kidney disease, stage 3b: Secondary | ICD-10-CM | POA: Diagnosis not present

## 2024-05-16 DIAGNOSIS — H409 Unspecified glaucoma: Secondary | ICD-10-CM | POA: Diagnosis not present

## 2024-05-16 DIAGNOSIS — M47812 Spondylosis without myelopathy or radiculopathy, cervical region: Secondary | ICD-10-CM | POA: Diagnosis not present

## 2024-05-16 DIAGNOSIS — E785 Hyperlipidemia, unspecified: Secondary | ICD-10-CM | POA: Diagnosis not present

## 2024-05-16 DIAGNOSIS — E559 Vitamin D deficiency, unspecified: Secondary | ICD-10-CM | POA: Diagnosis not present

## 2024-05-16 DIAGNOSIS — L89891 Pressure ulcer of other site, stage 1: Secondary | ICD-10-CM | POA: Diagnosis not present

## 2024-05-16 DIAGNOSIS — H04123 Dry eye syndrome of bilateral lacrimal glands: Secondary | ICD-10-CM | POA: Diagnosis not present

## 2024-05-16 DIAGNOSIS — I10 Essential (primary) hypertension: Secondary | ICD-10-CM | POA: Diagnosis not present

## 2024-05-16 DIAGNOSIS — K222 Esophageal obstruction: Secondary | ICD-10-CM | POA: Diagnosis not present

## 2024-05-16 DIAGNOSIS — G2581 Restless legs syndrome: Secondary | ICD-10-CM | POA: Diagnosis not present

## 2024-05-16 DIAGNOSIS — E1122 Type 2 diabetes mellitus with diabetic chronic kidney disease: Secondary | ICD-10-CM | POA: Diagnosis not present

## 2024-05-16 DIAGNOSIS — Z7982 Long term (current) use of aspirin: Secondary | ICD-10-CM | POA: Diagnosis not present

## 2024-05-16 DIAGNOSIS — Z7984 Long term (current) use of oral hypoglycemic drugs: Secondary | ICD-10-CM | POA: Diagnosis not present

## 2024-05-16 DIAGNOSIS — E1159 Type 2 diabetes mellitus with other circulatory complications: Secondary | ICD-10-CM | POA: Diagnosis not present

## 2024-05-16 DIAGNOSIS — I700 Atherosclerosis of aorta: Secondary | ICD-10-CM | POA: Diagnosis not present

## 2024-05-16 DIAGNOSIS — Z9181 History of falling: Secondary | ICD-10-CM | POA: Diagnosis not present

## 2024-05-16 NOTE — Transitions of Care (Post Inpatient/ED Visit) (Signed)
 Transition of Care week 4  Visit Note  05/16/2024  Name: Candice Brewer MRN: [id number]          DOB: [birth year]  Situation: Patient enrolled in [facility] 30-day program. Visit completed with patient and daughter by telephone.   Background:   Initial Transition Care Management Follow-up Telephone Call Discharge Facility: [facility]) Type of Discharge: Inpatient Admission Primary Inpatient Discharge Diagnosis:: Syncope Any questions or concerns?: No  Past Medical History:  Diagnosis Date   Arthritis    Chest pain, unspecified    Constipation    DM (diabetes mellitus) (HCC)    Dry eyes    Esophageal stricture 09/2011   schatzki's ring   Fibroids    uterus   Glaucoma    Helicobacter pylori gastritis 2012   Treated   HTN (hypertension)    Hyperlipidemia    Restless legs syndrome (RLS)    Vitamin D  deficiency     Assessment: Patient Reported Symptoms: Cognitive Cognitive Status: Able to follow simple commands, Alert and oriented to person, place, and time, Normal speech and language skills      Neurological Neurological Review of Symptoms: Dizziness Neurological Management Strategies: Adequate rest, Medication therapy, Medical device, Coping strategies (Physical Therapy) Neurological Self-Management Outcome: 4 (good) Neurological Comment: Patient states she is better but still have some dizziness. No falls or black outs reported when asked by RN CM. PT working with her on this as well as the side to side turning of the head patient stated.  HEENT HEENT Symptoms Reported: Dryness (Dry eyes, lower lids were red, went to [facility] on 05/15/24.) HEENT Management Strategies: Medication therapy HEENT Self-Management Outcome: 4 (good) HEENT Comment: Systane gel at Bedtime per [facility] appointment on 6/17 for dry eyes per daughter.    Cardiovascular Cardiovascular Symptoms Reported: Dizziness (Better but still having some dizziness/? cardiac related per DC notes.) Does  patient have uncontrolled Hypertension?: No Cardiovascular Management Strategies: Weight management, Coping strategies, Medication therapy, Medical device Do You Have a Working Readable Scale?: Yes Weight:  (Patient stated she has not weigh but she had not gained, and she is eating good.) Cardiovascular Comment: Heart monitor was mailed back last Saturday per daughter.  Respiratory Respiratory Symptoms Reported: No symptoms reported    Endocrine Patient reports the following symptoms related to hypoglycemia or hyperglycemia : No symptoms reported Is patient diabetic?: Yes Is patient checking blood sugars at home?: No (Trying to get [facility] through [facility] pharmacy, not on insulin ) Endocrine Management Strategies: Medical device, Routine screening Endocrine Self-Management Outcome: 3 (uncertain)  Gastrointestinal Gastrointestinal Symptoms Reported: No symptoms reported   Nutrition Risk Screen (CP):  (To watch her weight as she lost 12 lbs per hospital notes. She states she is eating Good but had not weight self.)  Genitourinary Genitourinary Symptoms Reported: No symptoms reported    Integumentary   Skin Management Strategies: Medication therapy Skin Comment: Patient had an appointment with podiatirist and was put on oral antibiotic and ointment for bilaterla big toes. F/u appointment 8/21, 2025.  Musculoskeletal   Musculoskeletal Conditions: Joint pain Musculoskeletal Management Strategies: Adequate rest, Coping strategies, Routine screening Musculoskeletal Comment: Daughter is going to obtain referral to [facility] via PCP for evaluation of ongoing LEFT hip pain. Falls in the past year?: Yes Number of falls in past year: 1 or less Was there an injury with Fall?: Yes Fall Risk Category Calculator: 2 Patient Fall Risk Level: Moderate Fall Risk Patient at Risk for Falls  Due to: History of fall(s) (No Falls in the past week.) Fall risk Follow up: Falls  prevention discussed  Psychosocial Psychosocial Symptoms Reported: No symptoms reported     Quality of Family Relationships: supportive   Vitals:   05/16/24 1107  BP: 124/60    Medications Reviewed Today     Reviewed by Areta Beer, RN (Case Manager) on 05/16/24 at 1101  Med List Status: <None>   Medication Order Taking? Sig Documenting Provider Last Dose Status Informant  aspirin  EC 81 MG tablet [id number] Yes Take 81 mg by mouth daily. [provider]  Active Self, Pharmacy Records, Other  brimonidine -timolol  (COMBIGAN ) 0.2-0.5 % ophthalmic solution [id number] Yes Place 1 drop into both eyes 2 (two) times daily.  [provider]  Active Self, Pharmacy Records, Other  cephALEXin (KEFLEX) 500 MG capsule [id number] Yes Take 500 mg by mouth 3 (three) times daily. Take until completed. [provider]  Active   cholecalciferol (VITAMIN D ) 1000 units tablet [id number] Yes Take 1,000 Units by mouth daily. [provider]  Active Self, Pharmacy Records, Other  Continuous Glucose Receiver (FREESTYLE LIBRE 3 READER) DEVI [id number]  1 Device by Does not apply route in the morning, at noon, in the evening, and at bedtime.  Patient not taking: Reported on 05/16/2024   Galvin Jules, FNP  Active   Continuous Glucose Sensor (FREESTYLE LIBRE 3 [place]) [place] [id number]  1 Device by Does not apply route in the morning, at noon, in the evening, and at bedtime. Place 1 sensor on the skin every 14 days. Use to check glucose continuously  Patient not taking: Reported on 05/16/2024   Galvin Jules, FNP  Active   empagliflozin  (JARDIANCE ) 10 MG TABS tablet [id number] Yes Take 10 mg by mouth daily. [provider]  Active   gabapentin  (NEURONTIN ) 100 MG capsule [id number] Yes Take 1 capsule (100 mg total) by mouth at bedtime. Galvin Jules, FNP  Active   hydrochlorothiazide  (HYDRODIURIL ) 25 MG tablet [id number] Yes TAKE ONE TABLET EVERY DAY Rakes, Georgeann Kindred, FNP  Active  Self, Pharmacy Records, Other  hypromellose (SYSTANE NIGHT) 0.3 % GEL ophthalmic ointment [id number] Yes Place 1 Application into both eyes at bedtime. [provider]  Active   meclizine  (ANTIVERT ) 25 MG tablet [id number] Yes Take 1 tablet (25 mg total) by mouth 3 (three) times daily as needed for dizziness. Kommor, [place], MD  Active Self, Pharmacy Records, Other  Multiple Vitamin (MULTIVITAMIN WITH MINERALS) TABS tablet [id number] Yes Take 1 tablet by mouth daily. [provider]  Active Self, Pharmacy Records, Other  mupirocin  cream (BACTROBAN ) 2 % [id number] Yes Apply 1 Application topically 2 (two) times daily. [provider]  Active   OVER THE COUNTER MEDICATION [id number]  Apply 1 Application topically as needed (pain). Amish gel [provider]  Active Self, Pharmacy Records, Other  Polyethyl Glycol-Propyl Glycol (SYSTANE) 0.4-0.3 % SOLN [id number] Yes Place 1 drop into both eyes as needed (dry eyes). [provider]  Active Self, Pharmacy Records, Other  valACYclovir  (VALTREX ) 1000 MG tablet [id number] Yes Take 500 mg by mouth daily. Daughter reports patient taking 500mg  once daily - will discuss with PCP 05/02/24 [provider]  Active Self, Pharmacy Records, Other  VYZULTA  0.024 % SOLN [id number] Yes Apply 1 drop to eye at bedtime. [provider]  Active Self, Pharmacy Records, Other            Goals Addressed  This Visit's Progress    VBCI Transitions of Care (TOC) Care Plan   On track    Problems: (Reviewed 05/16/24 TOC week 4) Recent Hospitalization for treatment of Syncope Functional/Safety concern: Hospitalization related to - Patient had syncopal episode resulting in her falling and hitting her head Patient states she has some dizziness at times, varies throughout the day, no syncopal episodes since discharge from hospital, taking meclizine , pt states  this does help.  Pt is wearing heart monitor, pt  states she does not check CBG, previously not interested and has arthritis making it difficult, may pay out of pocket for Freestyle Libre 3 due to insurance will not reimburse because pt is not on insulin , she does check blood pressure with today's reading 142/78, pt states she can start checking orthostatic BP and will ask PT to assist when they see her in the home, pt is working with home health PT/ OT  Goal:  Over the next 30 days, the patient will not experience hospital readmission  Interventions: (Reviewed 05/16/24 TOC week 4) Transitions of Care: Doctor Visits  - discussed the importance of doctor visits  Diabetes Interventions: (Reviewed 05/16/24 TOC week 4) Assessed patient's understanding of A1c goal: <6.5% Reviewed medications with patient and discussed importance of medication adherence Counseled on importance of regular laboratory monitoring as prescribed Discussed plans with patient for ongoing care management follow up and provided patient with direct contact information for care management team Reviewed scheduled/upcoming provider appointments including: Primary care provider   Reinforced importance of not ambulating if dizzy, always asking for assistance as needed.  Reviewed importance of checking CBG regularly, pt states she has not been checking and most likely will not: 05/16/24, Daughter working on obtaining [employer] or other options via [employer].   Lab Results  Component Value Date   HGBA1C 8.4 (H) 04/26/2024    Falls Interventions: (Reviewed 05/16/24 TOC week 4) Reviewed medications and discussed potential side effects of medications such as dizziness and frequent urination Advised patient of importance of notifying provider of falls. No falls reported on 05/16/24 week 4 TOC call.  Reviewed 05/16/24: working status of life alert bracelet and patient adherence- patient reports having diabetic alert bracelet and life alert necklace 05/16/24: Monitor mailed  back last Saturday per daughter: follow EP clinic as scheduled. Dr Lawana Pray to read - will call with any questions  Reinforced safety precautions Reviewed checking orthostatic BP and recording,  Patient has a blood pressure reading that [facility] took last week but has not been taking it herself 05/16/24 week 4 TOC call.   Patient Self Care Activities: (Reviewed and updated 05/16/24 TOC week 4) Attend all scheduled provider appointments Call pharmacy for medication refills 3-7 days in advance of running out of medications Call provider office for new concerns or questions  Notify RN Care Manager of TOC call rescheduling needs Participate in Transition of Care Program/Attend TOC scheduled calls Take medications as prescribed including new medications from eye provider and podiatrist.   check feet daily for cuts, sores or redness wash and dry feet carefully every day wear comfortable, cotton socks wear comfortable, well-fitting shoes Had podiatry appointment last week  Visited [facility] 05/15/24.  Do not ambulate if you are dizzy, always ask for help if needed, discussed safety plan for dizziness 05/16/24.  Please consider checking your blood sugar at home Please consider checking your blood pressure sitting and standing and keep a log, ask daughter to assist.  05/16/24: Weigh self weekly  to monitor and record: Encouraged patient to to this but unlikely to follow through. States she is eating good.   Plan: Continue with plan of care. (Reviewed 05/16/24 TOC week 4) Telephone follow up appointment with care management team member scheduled for:  05/23/24 @ 1015 with Cecilie Coffee RN CM The patient has been provided with contact information for the care management team and has been advised to call with any health related questions or concerns.         Recommendation:   Continue Current Plan of Care Daughter wishes to obtain an orthopaedic referral for chronic LEFT hip pain with [facility] in  [facility] via Call to PCP.   Follow Up Plan:   Telephone follow-up in 1 week 05/23/24 with Cecilie Coffee RN CM at 1015 am  Check on status of ortho referral.    Katheryn Pandy MSN, RN RN Case Manager [facility]  VBCI-Population Health Office Hours M-F [phone] Direct Dial: [phone] Main Phone [phone]  Fax: [phone] [email or web address]

## 2024-05-16 NOTE — Patient Instructions (Signed)
 Visit Information  Thank you for taking time to visit with me today. Please don't hesitate to contact me if I can be of assistance to you before our next scheduled telephone appointment.  Our next appointment is by telephone on 05/23/24 at 1015 am   Following is a copy of your care plan:   Goals Addressed             This Visit's Progress    VBCI Transitions of Care (TOC) Care Plan   On track    Problems: (Reviewed 05/16/24 TOC week 4) Recent Hospitalization for treatment of Syncope Functional/Safety concern: Hospitalization related to - Patient had syncopal episode resulting in her falling and hitting her head Patient states she has some dizziness at times, varies throughout the day, no syncopal episodes since discharge from hospital, taking meclizine , pt states  this does help.  Pt is wearing heart monitor, pt states she does not check CBG, previously not interested and has arthritis making it difficult, may pay out of pocket for Freestyle Libre 3 due to insurance will not reimburse because pt is not on insulin , she does check blood pressure with today's reading 142/78, pt states she can start checking orthostatic BP and will ask PT to assist when they see her in the home, pt is working with home health PT/ OT  Goal:  Over the next 30 days, the patient will not experience hospital readmission  Interventions: (Reviewed 05/16/24 TOC week 4) Transitions of Care: Doctor Visits  - discussed the importance of doctor visits  Diabetes Interventions: (Reviewed 05/16/24 TOC week 4) Assessed patient's understanding of A1c goal: <6.5% Reviewed medications with patient and discussed importance of medication adherence Counseled on importance of regular laboratory monitoring as prescribed Discussed plans with patient for ongoing care management follow up and provided patient with direct contact information for care management team Reviewed scheduled/upcoming provider appointments including:  Primary care provider   Reinforced importance of not ambulating if dizzy, always asking for assistance as needed.  Reviewed importance of checking CBG regularly, pt states she has not been checking and most likely will not: 05/16/24, Daughter working on obtaining [employer] or other options via [employer].   Lab Results  Component Value Date   HGBA1C 8.4 (H) 04/26/2024    Falls Interventions: (Reviewed 05/16/24 TOC week 4) Reviewed medications and discussed potential side effects of medications such as dizziness and frequent urination Advised patient of importance of notifying provider of falls. No falls reported on 05/16/24 week 4 TOC call.  Reviewed 05/16/24: working status of life alert bracelet and patient adherence- patient reports having diabetic alert bracelet and life alert necklace 05/16/24: Monitor mailed back last Saturday per daughter: follow EP clinic as scheduled. Dr Lawana Pray to read - will call with any questions  Reinforced safety precautions Reviewed checking orthostatic BP and recording,  Patient has a blood pressure reading that [facility] took last week but has not been taking it herself 05/16/24 week 4 TOC call.   Patient Self Care Activities: (Reviewed and updated 05/16/24 TOC week 4) Attend all scheduled provider appointments Call pharmacy for medication refills 3-7 days in advance of running out of medications Call provider office for new concerns or questions  Notify RN Care Manager of TOC call rescheduling needs Participate in Transition of Care Program/Attend TOC scheduled calls Take medications as prescribed including new medications from eye provider and podiatrist.   check feet daily for cuts, sores or redness wash and dry feet carefully every day  wear comfortable, cotton socks wear comfortable, well-fitting shoes Had podiatry appointment last week  Visited [facility] 05/15/24.  Do not ambulate if you are dizzy, always ask for help if needed, discussed  safety plan for dizziness 05/16/24.  Please consider checking your blood sugar at home Please consider checking your blood pressure sitting and standing and keep a log, ask daughter to assist.  05/16/24: Weigh self weekly to monitor and record: Encouraged patient to to this but unlikely to follow through. States she is eating good.   Plan: Continue with plan of care. (Reviewed 05/16/24 TOC week 4) Telephone follow up appointment with care management team member scheduled for:  05/23/24 @ 1015 with Cecilie Coffee RN CM The patient has been provided with contact information for the care management team and has been advised to call with any health related questions or concerns.         Patient verbalizes understanding of instructions and care plan provided today and agrees to view in MyChart. Active MyChart status and patient understanding of how to access instructions and care plan via MyChart confirmed with patient.     Follow up with provider re: Ortho referral for LEFT hip pain per daughter request.   Please call the care guide team at [phone] if you need to cancel or reschedule your appointment.   Please call the [place]  National Suicide Prevention Lifeline: 423-261-1052 or TTY: (539) 318-2511 TTY (936)405-2851) to talk to a trained counselor call 1-800-273-TALK (toll free, 24 hour hotline) if you are experiencing a Mental Health or Behavioral Health Crisis or need someone to talk to.   Katheryn Pandy MSN, RN RN Case Sales executive Health  VBCI-Population Health Office Hours M-F [phone] Direct Dial: [phone] Main Phone [phone]  Fax: [phone] [email or web address]

## 2024-05-17 DIAGNOSIS — G2581 Restless legs syndrome: Secondary | ICD-10-CM | POA: Diagnosis not present

## 2024-05-17 DIAGNOSIS — R339 Retention of urine, unspecified: Secondary | ICD-10-CM | POA: Diagnosis not present

## 2024-05-17 DIAGNOSIS — E559 Vitamin D deficiency, unspecified: Secondary | ICD-10-CM | POA: Diagnosis not present

## 2024-05-17 DIAGNOSIS — H409 Unspecified glaucoma: Secondary | ICD-10-CM | POA: Diagnosis not present

## 2024-05-17 DIAGNOSIS — K222 Esophageal obstruction: Secondary | ICD-10-CM | POA: Diagnosis not present

## 2024-05-17 DIAGNOSIS — H04123 Dry eye syndrome of bilateral lacrimal glands: Secondary | ICD-10-CM | POA: Diagnosis not present

## 2024-05-17 DIAGNOSIS — I10 Essential (primary) hypertension: Secondary | ICD-10-CM | POA: Diagnosis not present

## 2024-05-17 DIAGNOSIS — E1122 Type 2 diabetes mellitus with diabetic chronic kidney disease: Secondary | ICD-10-CM | POA: Diagnosis not present

## 2024-05-17 DIAGNOSIS — Z7984 Long term (current) use of oral hypoglycemic drugs: Secondary | ICD-10-CM | POA: Diagnosis not present

## 2024-05-17 DIAGNOSIS — Z9181 History of falling: Secondary | ICD-10-CM | POA: Diagnosis not present

## 2024-05-17 DIAGNOSIS — E785 Hyperlipidemia, unspecified: Secondary | ICD-10-CM | POA: Diagnosis not present

## 2024-05-17 DIAGNOSIS — E1159 Type 2 diabetes mellitus with other circulatory complications: Secondary | ICD-10-CM | POA: Diagnosis not present

## 2024-05-17 DIAGNOSIS — Z7982 Long term (current) use of aspirin: Secondary | ICD-10-CM | POA: Diagnosis not present

## 2024-05-17 DIAGNOSIS — N1832 Chronic kidney disease, stage 3b: Secondary | ICD-10-CM | POA: Diagnosis not present

## 2024-05-17 DIAGNOSIS — L89891 Pressure ulcer of other site, stage 1: Secondary | ICD-10-CM | POA: Diagnosis not present

## 2024-05-17 DIAGNOSIS — I152 Hypertension secondary to endocrine disorders: Secondary | ICD-10-CM | POA: Diagnosis not present

## 2024-05-17 DIAGNOSIS — I700 Atherosclerosis of aorta: Secondary | ICD-10-CM | POA: Diagnosis not present

## 2024-05-17 DIAGNOSIS — M47812 Spondylosis without myelopathy or radiculopathy, cervical region: Secondary | ICD-10-CM | POA: Diagnosis not present

## 2024-05-21 DIAGNOSIS — R339 Retention of urine, unspecified: Secondary | ICD-10-CM | POA: Diagnosis not present

## 2024-05-21 DIAGNOSIS — K222 Esophageal obstruction: Secondary | ICD-10-CM | POA: Diagnosis not present

## 2024-05-21 DIAGNOSIS — H409 Unspecified glaucoma: Secondary | ICD-10-CM | POA: Diagnosis not present

## 2024-05-21 DIAGNOSIS — M47812 Spondylosis without myelopathy or radiculopathy, cervical region: Secondary | ICD-10-CM | POA: Diagnosis not present

## 2024-05-21 DIAGNOSIS — N1832 Chronic kidney disease, stage 3b: Secondary | ICD-10-CM | POA: Diagnosis not present

## 2024-05-21 DIAGNOSIS — I700 Atherosclerosis of aorta: Secondary | ICD-10-CM | POA: Diagnosis not present

## 2024-05-21 DIAGNOSIS — G2581 Restless legs syndrome: Secondary | ICD-10-CM | POA: Diagnosis not present

## 2024-05-21 DIAGNOSIS — E559 Vitamin D deficiency, unspecified: Secondary | ICD-10-CM | POA: Diagnosis not present

## 2024-05-21 DIAGNOSIS — H04123 Dry eye syndrome of bilateral lacrimal glands: Secondary | ICD-10-CM | POA: Diagnosis not present

## 2024-05-21 DIAGNOSIS — Z9181 History of falling: Secondary | ICD-10-CM | POA: Diagnosis not present

## 2024-05-21 DIAGNOSIS — I152 Hypertension secondary to endocrine disorders: Secondary | ICD-10-CM | POA: Diagnosis not present

## 2024-05-21 DIAGNOSIS — Z7984 Long term (current) use of oral hypoglycemic drugs: Secondary | ICD-10-CM | POA: Diagnosis not present

## 2024-05-21 DIAGNOSIS — E1159 Type 2 diabetes mellitus with other circulatory complications: Secondary | ICD-10-CM | POA: Diagnosis not present

## 2024-05-21 DIAGNOSIS — L89891 Pressure ulcer of other site, stage 1: Secondary | ICD-10-CM | POA: Diagnosis not present

## 2024-05-21 DIAGNOSIS — Z7982 Long term (current) use of aspirin: Secondary | ICD-10-CM | POA: Diagnosis not present

## 2024-05-21 DIAGNOSIS — E785 Hyperlipidemia, unspecified: Secondary | ICD-10-CM | POA: Diagnosis not present

## 2024-05-21 DIAGNOSIS — I10 Essential (primary) hypertension: Secondary | ICD-10-CM | POA: Diagnosis not present

## 2024-05-21 DIAGNOSIS — E1122 Type 2 diabetes mellitus with diabetic chronic kidney disease: Secondary | ICD-10-CM | POA: Diagnosis not present

## 2024-05-22 ENCOUNTER — Other Ambulatory Visit (HOSPITAL_COMMUNITY): Payer: Self-pay | Admitting: Family Medicine

## 2024-05-22 ENCOUNTER — Telehealth: Payer: Self-pay

## 2024-05-22 DIAGNOSIS — G8929 Other chronic pain: Secondary | ICD-10-CM

## 2024-05-22 DIAGNOSIS — Z1231 Encounter for screening mammogram for malignant neoplasm of breast: Secondary | ICD-10-CM

## 2024-05-22 NOTE — Telephone Encounter (Signed)
 Copied from CRM [id number]. Topic: Referral - Request for Referral >> May 22, 2024  4:27 PM Nathanel BROCKS wrote: Did the patient discuss referral with their provider in the last year? Yes (If No - schedule appointment) (If Yes - send message)  Appointment offered? Yes  Type of order/referral and detailed reason for visit:  Orthopedic Surgeon   Preference of office, provider, location:  [facility] in [place] [address]  [phone]  If referral order, have you been seen by this specialty before? Yes (If Yes, this issue or another issue? When? Where?Not for what she is needing this time  Can we respond through MyChart? No

## 2024-05-23 DIAGNOSIS — G2581 Restless legs syndrome: Secondary | ICD-10-CM | POA: Diagnosis not present

## 2024-05-23 DIAGNOSIS — R55 Syncope and collapse: Secondary | ICD-10-CM | POA: Diagnosis not present

## 2024-05-23 DIAGNOSIS — I700 Atherosclerosis of aorta: Secondary | ICD-10-CM | POA: Diagnosis not present

## 2024-05-23 DIAGNOSIS — E1122 Type 2 diabetes mellitus with diabetic chronic kidney disease: Secondary | ICD-10-CM | POA: Diagnosis not present

## 2024-05-23 DIAGNOSIS — Z7982 Long term (current) use of aspirin: Secondary | ICD-10-CM | POA: Diagnosis not present

## 2024-05-23 DIAGNOSIS — L89891 Pressure ulcer of other site, stage 1: Secondary | ICD-10-CM | POA: Diagnosis not present

## 2024-05-23 DIAGNOSIS — I152 Hypertension secondary to endocrine disorders: Secondary | ICD-10-CM | POA: Diagnosis not present

## 2024-05-23 DIAGNOSIS — N1832 Chronic kidney disease, stage 3b: Secondary | ICD-10-CM | POA: Diagnosis not present

## 2024-05-23 DIAGNOSIS — H409 Unspecified glaucoma: Secondary | ICD-10-CM | POA: Diagnosis not present

## 2024-05-23 DIAGNOSIS — Z9181 History of falling: Secondary | ICD-10-CM | POA: Diagnosis not present

## 2024-05-23 DIAGNOSIS — E785 Hyperlipidemia, unspecified: Secondary | ICD-10-CM | POA: Diagnosis not present

## 2024-05-23 DIAGNOSIS — E559 Vitamin D deficiency, unspecified: Secondary | ICD-10-CM | POA: Diagnosis not present

## 2024-05-23 DIAGNOSIS — Z7984 Long term (current) use of oral hypoglycemic drugs: Secondary | ICD-10-CM | POA: Diagnosis not present

## 2024-05-23 DIAGNOSIS — K222 Esophageal obstruction: Secondary | ICD-10-CM | POA: Diagnosis not present

## 2024-05-23 DIAGNOSIS — H04123 Dry eye syndrome of bilateral lacrimal glands: Secondary | ICD-10-CM | POA: Diagnosis not present

## 2024-05-23 DIAGNOSIS — R339 Retention of urine, unspecified: Secondary | ICD-10-CM | POA: Diagnosis not present

## 2024-05-23 DIAGNOSIS — M47812 Spondylosis without myelopathy or radiculopathy, cervical region: Secondary | ICD-10-CM | POA: Diagnosis not present

## 2024-05-23 DIAGNOSIS — E1159 Type 2 diabetes mellitus with other circulatory complications: Secondary | ICD-10-CM | POA: Diagnosis not present

## 2024-05-23 DIAGNOSIS — I10 Essential (primary) hypertension: Secondary | ICD-10-CM | POA: Diagnosis not present

## 2024-05-23 NOTE — Addendum Note (Signed)
 Addended by: OLENA HARLENE BROCKS on: 05/23/2024 04:53 PM   Modules accepted: Orders

## 2024-05-23 NOTE — Telephone Encounter (Signed)
 Pt returned call, Chronic left hip pain. Not getting any better, pain meds are not effective. Mother would like a consultation to see if she can get her left hip replaced. Clemens on this hip December 2023. Has been in pain ever since. Very interested in hip replacement.

## 2024-05-23 NOTE — Telephone Encounter (Signed)
 Referral placed.

## 2024-05-23 NOTE — Telephone Encounter (Signed)
 Lmtcb When patient calls back as the reason needed for referral

## 2024-05-25 DIAGNOSIS — R55 Syncope and collapse: Secondary | ICD-10-CM

## 2024-05-26 NOTE — Progress Notes (Unsigned)
  Electrophysiology Office Note:   Date:  05/28/2024  ID:  Candice Brewer, DOB [birth year], MRN [id number]  Primary Cardiologist: None Primary Heart Failure: None Electrophysiologist: Will Gladis Norton, MD      History of Present Illness:   Candice Brewer is a 89 y.o. female with h/o syncope, HTN, HLD, Dysphagia / Schatzki's ring, CKD 3, DM II  seen today for post hospital visit due to syncope.    She was seen in hospital for syncopal episode after going to the bathroom. Thought to be orthostatic hypotension +/- micturition syncope.  She had no further episodes of bradycardia while in hospital.  Discharged with a cardiac monitor.  Monitor showed 1 non-sustained SVT lasting 8 beats, symptom trigger episodes corresponded to normal sinus rhythm.  Patient reports doing well since her admission. No episodes of syncope. She reports she is still dealing with vertigo and her hip pain (pending an Ortho eval).  She states she she moves her head she will at times get dizzy - she sits still and closes her eyes and it goes away.   She denies chest pain, palpitations, dyspnea, PND, orthopnea, nausea, vomiting, syncope, edema, weight gain, or early satiety.   Review of systems complete and found to be negative unless listed in HPI.   EP Information / Studies Reviewed:    EKG is not ordered today. EKG from 04/24/24 reviewed which showed SB 40 bpm      Studies:  ECHO 04/26/24 > LVEF 60-65%, G1DD Cardiac Monitor 05/24/24 > 1 non-sustained SVT lasting 8 beats, no AF, rare supraventricular ectopy, rare ventricular ectopy, no arrhythmias, symptom trigger episodes correspond to NSR    Risk Assessment/Calculations:              Physical Exam:   VS:  BP 122/60 (BP Location: Right Arm)   Pulse 70   Ht 5' 6 (1.676 m)   Wt 120 lb (54.4 kg)   SpO2 97%   BMI 19.37 kg/m    Wt Readings from Last 3 Encounters:  05/28/24 120 lb (54.4 kg)  05/02/24 128 lb 12.8 oz (58.4 kg)  04/24/24 120 lb (54.4 kg)      GEN: Well nourished elderly female, well developed in no acute distress NECK: No JVD; No carotid bruits CARDIAC: Regular rate and rhythm, no murmurs, rubs, gallops RESPIRATORY:  Clear to auscultation without rales, wheezing or rhonchi  ABDOMEN: Soft, non-tender, non-distended EXTREMITIES:  No edema; No deformity   ASSESSMENT AND PLAN:    Syncope  Vertigo Appears to have been orthostatic in nature.  No further events while inpatient on tele. Cardiac monitor post admit did not show arrhythmias.   -encourage proper hydration  -compression stockings, shorts recommended  -avoid AV nodal blockers  -slow position changes  -reviewed no indication for device at this time -discussed consideration for loop if further symptoms    Follow up with Dr. Norton as needed   Signed, Daphne Barrack, NP-C, AGACNP-BC [facility] - Electrophysiology  05/28/2024, 9:47 AM

## 2024-05-28 ENCOUNTER — Encounter: Payer: Self-pay | Admitting: Pulmonary Disease

## 2024-05-28 ENCOUNTER — Ambulatory Visit: Attending: Pulmonary Disease | Admitting: Pulmonary Disease

## 2024-05-28 VITALS — BP 122/60 | HR 70 | Ht 66.0 in | Wt 120.0 lb

## 2024-05-28 DIAGNOSIS — R55 Syncope and collapse: Secondary | ICD-10-CM | POA: Diagnosis not present

## 2024-05-28 NOTE — Patient Instructions (Signed)
 Medication Instructions:  Your physician recommends that you continue on your current medications as directed. Please refer to the Current Medication list given to you today.  *If you need a refill on your cardiac medications before your next appointment, please call your pharmacy*  Lab Work: None ordered If you have labs (blood work) drawn today and your tests are completely normal, you will receive your results only by: MyChart Message (if you have MyChart) OR A paper copy in the mail If you have any lab test that is abnormal or we need to change your treatment, we will call you to review the results.  Follow-Up: At [facility], you and your health needs are our priority.  As part of our continuing mission to provide you with exceptional heart care, our providers are all part of one team.  This team includes your primary Cardiologist (physician) and Advanced Practice Providers or APPs (Physician Assistants and Nurse Practitioners) who all work together to provide you with the care you need, when you need it.  Your next appointment:   As needed

## 2024-05-29 ENCOUNTER — Telehealth: Payer: Self-pay | Admitting: Pharmacy Technician

## 2024-05-29 DIAGNOSIS — H409 Unspecified glaucoma: Secondary | ICD-10-CM | POA: Diagnosis not present

## 2024-05-29 DIAGNOSIS — E559 Vitamin D deficiency, unspecified: Secondary | ICD-10-CM | POA: Diagnosis not present

## 2024-05-29 DIAGNOSIS — Z9181 History of falling: Secondary | ICD-10-CM | POA: Diagnosis not present

## 2024-05-29 DIAGNOSIS — Z7982 Long term (current) use of aspirin: Secondary | ICD-10-CM | POA: Diagnosis not present

## 2024-05-29 DIAGNOSIS — M47812 Spondylosis without myelopathy or radiculopathy, cervical region: Secondary | ICD-10-CM | POA: Diagnosis not present

## 2024-05-29 DIAGNOSIS — G2581 Restless legs syndrome: Secondary | ICD-10-CM | POA: Diagnosis not present

## 2024-05-29 DIAGNOSIS — E785 Hyperlipidemia, unspecified: Secondary | ICD-10-CM | POA: Diagnosis not present

## 2024-05-29 DIAGNOSIS — I10 Essential (primary) hypertension: Secondary | ICD-10-CM | POA: Diagnosis not present

## 2024-05-29 DIAGNOSIS — E1122 Type 2 diabetes mellitus with diabetic chronic kidney disease: Secondary | ICD-10-CM | POA: Diagnosis not present

## 2024-05-29 DIAGNOSIS — H04123 Dry eye syndrome of bilateral lacrimal glands: Secondary | ICD-10-CM | POA: Diagnosis not present

## 2024-05-29 DIAGNOSIS — Z7984 Long term (current) use of oral hypoglycemic drugs: Secondary | ICD-10-CM | POA: Diagnosis not present

## 2024-05-29 DIAGNOSIS — E1159 Type 2 diabetes mellitus with other circulatory complications: Secondary | ICD-10-CM | POA: Diagnosis not present

## 2024-05-29 DIAGNOSIS — I152 Hypertension secondary to endocrine disorders: Secondary | ICD-10-CM | POA: Diagnosis not present

## 2024-05-29 DIAGNOSIS — K222 Esophageal obstruction: Secondary | ICD-10-CM | POA: Diagnosis not present

## 2024-05-29 DIAGNOSIS — N1832 Chronic kidney disease, stage 3b: Secondary | ICD-10-CM | POA: Diagnosis not present

## 2024-05-29 DIAGNOSIS — I700 Atherosclerosis of aorta: Secondary | ICD-10-CM | POA: Diagnosis not present

## 2024-05-29 DIAGNOSIS — R339 Retention of urine, unspecified: Secondary | ICD-10-CM | POA: Diagnosis not present

## 2024-05-29 DIAGNOSIS — L89891 Pressure ulcer of other site, stage 1: Secondary | ICD-10-CM | POA: Diagnosis not present

## 2024-05-29 NOTE — Progress Notes (Signed)
   05/29/2024 Name: Candice Brewer MRN: [id number] DOB: 1932/06/26  Patient is appearing on a report for [facility] [place] [facility] and last engaged with the clinical pharmacist to discuss diabetes on 12/30/2023. Contacted patient today to discuss diabetes management and completed medication review.   Diabetes Plan from last clinical pharmacist appointment  Diabetes: - Currently uncontrolled--reports no side effects to farxiga              No aware of Bgs as someone is checking & recording for her; will touch base with patient at PCP visit in Feb; hoping better controlled at that time; Farxiga  monotherapy-continue  - Reviewed long term cardiovascular and renal outcomes of uncontrolled blood sugar - Reviewed goal A1c, goal fasting, and goal 2 hour post prandial glucose - Recommend to continue Farxiga   - Patient denies personal or family history of multiple endocrine neoplasia type 2, medullary thyroid  cancer; personal history of pancreatitis or gallbladder disease. - Recommend to check glucose daily (fasting) or if symptomatic  -Follow Up Plan: PCP 01/25/24(copy/paste from last note)   Medication Adherence Barriers Identified:  Patient made recommended medication changes per plan: Yes Patient was having side effects to Farxiga  so MD changed to Jardiance . Patient has been adherent to Jardiance  as per Dr Annemarie. Kandra dates as follow of 2/26,3/29,4/22,5/29 and 6/28 for 30 days supply Access issues with any new medication or testing device: Yes Patient informs the CGM costs are cost prohibitive. She informs the cash price for the reader and 2 weeks of sensors are greater than $200 combined. She informs the PA with insurance was denied. CPhT noted on 05/27/24 PA denied due to patient not being on insulin . MD would prefer patient have CGM due to increased dizzy spells and patient has difficulty with with fingersticks due to poor circulation and arthritis. Patient is checking blood sugars as prescribed: No As  mentioned above, patient has difficulty with fingersticks due to poor circulation and arthritis and therefore does NOT check blood sugars. Patient informs she can NOT afford CGM since insurance denied PA> Patient informs she is still experiencing dizziness though may not be as bad as previous episodes. She changes positions slowly and will sit down if she feels a dizzy spell starting to come on. She informs she was having some constipation but has added a fiber supplement that seems to have resolved the constipation.  Medication Adherence Barriers Addressed/Actions Taken:  Reviewed medication changes per plan from last clinical pharmacist note Medication Access for CGM Will discuss medication access concerns with pharmacist Collaborated with Patient Advocate team regarding prior authorization PA was denied and the cash price is cost prohibitive. Educated patient to contact pharmacy regarding new prescriptions  Reviewed instructions for monitoring blood sugars at home and reminded patient to keep a written log to review with pharmacist Reminded patient of date/time of upcoming clinical pharmacist follow up and any upcoming PCP/specialists visits. Patient denies transportation barriers to the appointment. Yes  Next clinical pharmacist appointment is scheduled for: TBD   Kate Caddy, CPhT [facility] Office: [phone] Email: [email or web address]

## 2024-06-04 DIAGNOSIS — R339 Retention of urine, unspecified: Secondary | ICD-10-CM | POA: Diagnosis not present

## 2024-06-04 DIAGNOSIS — Z7982 Long term (current) use of aspirin: Secondary | ICD-10-CM | POA: Diagnosis not present

## 2024-06-04 DIAGNOSIS — M47812 Spondylosis without myelopathy or radiculopathy, cervical region: Secondary | ICD-10-CM | POA: Diagnosis not present

## 2024-06-04 DIAGNOSIS — E1159 Type 2 diabetes mellitus with other circulatory complications: Secondary | ICD-10-CM | POA: Diagnosis not present

## 2024-06-04 DIAGNOSIS — I700 Atherosclerosis of aorta: Secondary | ICD-10-CM | POA: Diagnosis not present

## 2024-06-04 DIAGNOSIS — H04123 Dry eye syndrome of bilateral lacrimal glands: Secondary | ICD-10-CM | POA: Diagnosis not present

## 2024-06-04 DIAGNOSIS — K222 Esophageal obstruction: Secondary | ICD-10-CM | POA: Diagnosis not present

## 2024-06-04 DIAGNOSIS — E785 Hyperlipidemia, unspecified: Secondary | ICD-10-CM | POA: Diagnosis not present

## 2024-06-04 DIAGNOSIS — Z9181 History of falling: Secondary | ICD-10-CM | POA: Diagnosis not present

## 2024-06-04 DIAGNOSIS — I152 Hypertension secondary to endocrine disorders: Secondary | ICD-10-CM | POA: Diagnosis not present

## 2024-06-04 DIAGNOSIS — Z7984 Long term (current) use of oral hypoglycemic drugs: Secondary | ICD-10-CM | POA: Diagnosis not present

## 2024-06-04 DIAGNOSIS — G2581 Restless legs syndrome: Secondary | ICD-10-CM | POA: Diagnosis not present

## 2024-06-04 DIAGNOSIS — E559 Vitamin D deficiency, unspecified: Secondary | ICD-10-CM | POA: Diagnosis not present

## 2024-06-04 DIAGNOSIS — L89891 Pressure ulcer of other site, stage 1: Secondary | ICD-10-CM | POA: Diagnosis not present

## 2024-06-04 DIAGNOSIS — E1122 Type 2 diabetes mellitus with diabetic chronic kidney disease: Secondary | ICD-10-CM | POA: Diagnosis not present

## 2024-06-04 DIAGNOSIS — H409 Unspecified glaucoma: Secondary | ICD-10-CM | POA: Diagnosis not present

## 2024-06-04 DIAGNOSIS — I10 Essential (primary) hypertension: Secondary | ICD-10-CM | POA: Diagnosis not present

## 2024-06-04 DIAGNOSIS — N1832 Chronic kidney disease, stage 3b: Secondary | ICD-10-CM | POA: Diagnosis not present

## 2024-06-05 ENCOUNTER — Encounter: Payer: Self-pay | Admitting: *Deleted

## 2024-06-05 DIAGNOSIS — Z947 Corneal transplant status: Secondary | ICD-10-CM | POA: Diagnosis not present

## 2024-06-05 LAB — HM DIABETES EYE EXAM

## 2024-06-05 NOTE — Transitions of Care (Post Inpatient/ED Visit) (Signed)
 06/05/2024  Patient ID: Candice Brewer English, female   DOB: 1931/12/14, 89 y.o.   MRN: [id number]  Case closure- Goals/ Targets met  Mliss Creed [facility], BSN RN Care Manager/ Transition of Care [place]/ [facility] [phone]

## 2024-06-11 DIAGNOSIS — N1832 Chronic kidney disease, stage 3b: Secondary | ICD-10-CM | POA: Diagnosis not present

## 2024-06-11 DIAGNOSIS — Z9181 History of falling: Secondary | ICD-10-CM | POA: Diagnosis not present

## 2024-06-11 DIAGNOSIS — E785 Hyperlipidemia, unspecified: Secondary | ICD-10-CM | POA: Diagnosis not present

## 2024-06-11 DIAGNOSIS — I10 Essential (primary) hypertension: Secondary | ICD-10-CM | POA: Diagnosis not present

## 2024-06-11 DIAGNOSIS — M47812 Spondylosis without myelopathy or radiculopathy, cervical region: Secondary | ICD-10-CM | POA: Diagnosis not present

## 2024-06-11 DIAGNOSIS — I700 Atherosclerosis of aorta: Secondary | ICD-10-CM | POA: Diagnosis not present

## 2024-06-11 DIAGNOSIS — E559 Vitamin D deficiency, unspecified: Secondary | ICD-10-CM | POA: Diagnosis not present

## 2024-06-11 DIAGNOSIS — G2581 Restless legs syndrome: Secondary | ICD-10-CM | POA: Diagnosis not present

## 2024-06-11 DIAGNOSIS — L89891 Pressure ulcer of other site, stage 1: Secondary | ICD-10-CM | POA: Diagnosis not present

## 2024-06-11 DIAGNOSIS — Z7982 Long term (current) use of aspirin: Secondary | ICD-10-CM | POA: Diagnosis not present

## 2024-06-11 DIAGNOSIS — E1159 Type 2 diabetes mellitus with other circulatory complications: Secondary | ICD-10-CM | POA: Diagnosis not present

## 2024-06-11 DIAGNOSIS — Z7984 Long term (current) use of oral hypoglycemic drugs: Secondary | ICD-10-CM | POA: Diagnosis not present

## 2024-06-11 DIAGNOSIS — E1122 Type 2 diabetes mellitus with diabetic chronic kidney disease: Secondary | ICD-10-CM | POA: Diagnosis not present

## 2024-06-11 DIAGNOSIS — I152 Hypertension secondary to endocrine disorders: Secondary | ICD-10-CM | POA: Diagnosis not present

## 2024-06-11 DIAGNOSIS — R339 Retention of urine, unspecified: Secondary | ICD-10-CM | POA: Diagnosis not present

## 2024-06-11 DIAGNOSIS — H04123 Dry eye syndrome of bilateral lacrimal glands: Secondary | ICD-10-CM | POA: Diagnosis not present

## 2024-06-11 DIAGNOSIS — K222 Esophageal obstruction: Secondary | ICD-10-CM | POA: Diagnosis not present

## 2024-06-11 DIAGNOSIS — H409 Unspecified glaucoma: Secondary | ICD-10-CM | POA: Diagnosis not present

## 2024-06-18 ENCOUNTER — Other Ambulatory Visit: Payer: Self-pay | Admitting: Family Medicine

## 2024-06-18 DIAGNOSIS — I10 Essential (primary) hypertension: Secondary | ICD-10-CM

## 2024-06-19 ENCOUNTER — Other Ambulatory Visit

## 2024-06-21 DIAGNOSIS — H04123 Dry eye syndrome of bilateral lacrimal glands: Secondary | ICD-10-CM | POA: Diagnosis not present

## 2024-06-21 DIAGNOSIS — H409 Unspecified glaucoma: Secondary | ICD-10-CM | POA: Diagnosis not present

## 2024-06-21 DIAGNOSIS — K222 Esophageal obstruction: Secondary | ICD-10-CM | POA: Diagnosis not present

## 2024-06-21 DIAGNOSIS — R339 Retention of urine, unspecified: Secondary | ICD-10-CM | POA: Diagnosis not present

## 2024-06-21 DIAGNOSIS — M47812 Spondylosis without myelopathy or radiculopathy, cervical region: Secondary | ICD-10-CM | POA: Diagnosis not present

## 2024-06-21 DIAGNOSIS — I10 Essential (primary) hypertension: Secondary | ICD-10-CM | POA: Diagnosis not present

## 2024-06-21 DIAGNOSIS — E1122 Type 2 diabetes mellitus with diabetic chronic kidney disease: Secondary | ICD-10-CM | POA: Diagnosis not present

## 2024-06-21 DIAGNOSIS — N1832 Chronic kidney disease, stage 3b: Secondary | ICD-10-CM | POA: Diagnosis not present

## 2024-06-21 DIAGNOSIS — Z7982 Long term (current) use of aspirin: Secondary | ICD-10-CM | POA: Diagnosis not present

## 2024-06-21 DIAGNOSIS — Z9181 History of falling: Secondary | ICD-10-CM | POA: Diagnosis not present

## 2024-06-21 DIAGNOSIS — E785 Hyperlipidemia, unspecified: Secondary | ICD-10-CM | POA: Diagnosis not present

## 2024-06-21 DIAGNOSIS — E1159 Type 2 diabetes mellitus with other circulatory complications: Secondary | ICD-10-CM | POA: Diagnosis not present

## 2024-06-21 DIAGNOSIS — G2581 Restless legs syndrome: Secondary | ICD-10-CM | POA: Diagnosis not present

## 2024-06-21 DIAGNOSIS — I152 Hypertension secondary to endocrine disorders: Secondary | ICD-10-CM | POA: Diagnosis not present

## 2024-06-21 DIAGNOSIS — Z7984 Long term (current) use of oral hypoglycemic drugs: Secondary | ICD-10-CM | POA: Diagnosis not present

## 2024-06-21 DIAGNOSIS — E559 Vitamin D deficiency, unspecified: Secondary | ICD-10-CM | POA: Diagnosis not present

## 2024-06-21 DIAGNOSIS — L89891 Pressure ulcer of other site, stage 1: Secondary | ICD-10-CM | POA: Diagnosis not present

## 2024-06-21 DIAGNOSIS — I700 Atherosclerosis of aorta: Secondary | ICD-10-CM | POA: Diagnosis not present

## 2024-06-22 DIAGNOSIS — M1612 Unilateral primary osteoarthritis, left hip: Secondary | ICD-10-CM | POA: Diagnosis not present

## 2024-06-22 DIAGNOSIS — M25552 Pain in left hip: Secondary | ICD-10-CM | POA: Diagnosis not present

## 2024-06-27 DIAGNOSIS — M1612 Unilateral primary osteoarthritis, left hip: Secondary | ICD-10-CM | POA: Diagnosis not present

## 2024-07-02 DIAGNOSIS — R778 Other specified abnormalities of plasma proteins: Secondary | ICD-10-CM | POA: Diagnosis not present

## 2024-07-02 DIAGNOSIS — D631 Anemia in chronic kidney disease: Secondary | ICD-10-CM | POA: Diagnosis not present

## 2024-07-02 DIAGNOSIS — N189 Chronic kidney disease, unspecified: Secondary | ICD-10-CM | POA: Diagnosis not present

## 2024-07-02 DIAGNOSIS — R809 Proteinuria, unspecified: Secondary | ICD-10-CM | POA: Diagnosis not present

## 2024-07-06 ENCOUNTER — Other Ambulatory Visit

## 2024-07-06 DIAGNOSIS — Z7984 Long term (current) use of oral hypoglycemic drugs: Secondary | ICD-10-CM

## 2024-07-06 DIAGNOSIS — E119 Type 2 diabetes mellitus without complications: Secondary | ICD-10-CM

## 2024-07-06 NOTE — Progress Notes (Signed)
 07/06/2024 Name: Candice Brewer MRN: [id number] DOB: 04/17/1932  Chief Complaint  Patient presents with   Diabetes    Candice Brewer is a 89 y.o. year old female who presented for a telephone visit.   They were referred to the pharmacist by their PCP for assistance in managing diabetes.    Subjective:  Care Team: Primary Care Provider: Severa Rock HERO, FNP ; Next Scheduled Visit: 08/08/2024   Medication Access/Adherence  Current Pharmacy:  [facility] [place], [place] - [address] [place] [address] Phone: [phone] Fax: [phone]   Patient reports affordability concerns with their medications: No  Patient reports access/transportation concerns to their pharmacy: No  Patient reports adherence concerns with their medications:  No     Diabetes:  Current medications: Jardiance  10mg  Medications tried in the past: metformin, glipizide, janumet , Farxiga  (swelling per  notes) Medication Adherence Barriers Identified:   Patient made recommended medication changes per plan: Yes Patient was having side effects to Farxiga  so MD changed to Jardiance . Patient has been adherent to Jardiance  as per Dr Annemarie. Kandra dates as follow of 2/26,3/29,4/22,5/29 and 6/28 for 30 days supply Access issues with any new medication or testing device: Yes Patient informs the CGM costs are cost prohibitive. She informs the cash price for the reader and 2 weeks of sensors are greater than $200 combined. She informs the PA with insurance was denied. CPhT noted on 05/27/24 PA denied due to patient not being on insulin . MD would prefer patient have CGM due to increased dizzy spells and patient has difficulty with with fingersticks due to poor circulation and arthritis. Patient is checking blood sugars as prescribed: No As mentioned above, patient has difficulty with fingersticks due to poor circulation and arthritis and therefore does NOT check blood sugars. Patient  informs she can NOT afford CGM since insurance denied PA> Patient informs she is still experiencing dizziness though may not be as bad as previous episodes. She changes positions slowly and will sit down if she feels a dizzy spell starting to come on. She informs she was having some constipation but has added a fiber supplement that seems to have resolved the constipation.  Current glucose readings: n/a Unable to get CGM due to cost--$200  Current physical activity: limited due to age  Current medication access support: [employer] medicare   Objective:  Lab Results  Component Value Date   HGBA1C 8.4 (H) 04/26/2024    Lab Results  Component Value Date   CREATININE 1.50 (H) 05/02/2024   BUN 57 (H) 05/02/2024   NA 135 05/02/2024   K 4.4 05/02/2024   CL 95 (L) 05/02/2024   CO2 20 05/02/2024    Lab Results  Component Value Date   CHOL 206 (H) 10/21/2023   HDL 99 10/21/2023   LDLCALC 82 10/21/2023   TRIG 151 (H) 10/21/2023   CHOLHDL 2.1 10/21/2023    Medications Reviewed Today     Reviewed by Billee Mliss BIRCH, [facility] (Pharmacist) on 07/11/24 at 2245  Med List Status: <None>   Medication Order Taking? Sig Documenting Provider Last Dose Status Informant  aspirin  EC 81 MG tablet [id number]  Take 81 mg by mouth daily. [provider]  Active Self, Pharmacy Records, Other  brimonidine -timolol  (COMBIGAN ) 0.2-0.5 % ophthalmic solution [id number]  Place 1 drop into both eyes 2 (two) times daily.  [provider]  Active Self, Pharmacy Records, Other  cephALEXin (KEFLEX) 500 MG capsule  [id number]  Take 500 mg by mouth 3 (three) times daily. Take until completed.  Patient not taking: Reported on 05/28/2024   [provider]  Active   cholecalciferol (VITAMIN D ) 1000 units tablet [id number]  Take 1,000 Units by mouth daily. [provider]  Active Self, Pharmacy Records, Other  empagliflozin  (JARDIANCE ) 10 MG TABS tablet [id number]  Take 10 mg by mouth daily.  [provider]  Active   gabapentin  (NEURONTIN ) 100 MG capsule [id number]  Take 1 capsule (100 mg total) by mouth at bedtime. Severa Rock HERO, FNP  Active   hydrochlorothiazide  (HYDRODIURIL ) 25 MG tablet [id number]  TAKE ONE TABLET ONCE DAILY Rakes, Rock HERO, FNP  Active   hypromellose (SYSTANE NIGHT) 0.3 % GEL ophthalmic ointment [id number]  Place 1 Application into both eyes at bedtime. [provider]  Active   meclizine  (ANTIVERT ) 25 MG tablet [id number]  Take 1 tablet (25 mg total) by mouth 3 (three) times daily as needed for dizziness. Kommor, Madison, MD  Active Self, Pharmacy Records, Other  Multiple Vitamin (MULTIVITAMIN WITH MINERALS) TABS tablet [id number]  Take 1 tablet by mouth daily. [provider]  Active Self, Pharmacy Records, Other  mupirocin  cream (BACTROBAN ) 2 % [id number]  Apply 1 Application topically 2 (two) times daily. [provider]  Active   OVER THE COUNTER MEDICATION [id number]  Apply 1 Application topically as needed (pain). Amish gel [provider]  Active Self, Pharmacy Records, Other  Polyethyl Glycol-Propyl Glycol (SYSTANE) 0.4-0.3 % SOLN [id number]  Place 1 drop into both eyes as needed (dry eyes). [provider]  Active Self, Pharmacy Records, Other  valACYclovir  (VALTREX ) 1000 MG tablet [id number]  Take 500 mg by mouth daily. Daughter reports patient taking 500mg  once daily - will discuss with PCP 05/02/24 [provider]  Active Self, Pharmacy Records, Other  VYZULTA  0.024 % SOLN [id number]  Apply 1 drop to eye at bedtime. [provider]  Active Self, Pharmacy Records, Other             Assessment/Plan:   Diabetes: - Currently uncontrolled - Reviewed long term cardiovascular and renal outcomes of uncontrolled blood sugar - Reviewed goal A1c, goal fasting, and goal 2 hour post prandial glucose - Recommend to continue current therapy (limited based on past trials/failures) - Patient denies  personal or family history of multiple endocrine neoplasia type 2, medullary thyroid  cancer; personal history of pancreatitis or gallbladder disease. - Recommend to check glucose daily (fasting) or if symptomatic (have family assist checking blood sugars) -statin intolerance noted    Follow Up Plan: as needed, PCP as scheduled  Mliss Tarry Griffin, PharmD, BCACP, CPP Clinical Pharmacist, [facility]

## 2024-07-09 ENCOUNTER — Ambulatory Visit (HOSPITAL_COMMUNITY)
Admission: RE | Admit: 2024-07-09 | Discharge: 2024-07-09 | Disposition: A | Discharge status: Home / Self Care | Source: Ambulatory Visit | Attending: Family Medicine | Admitting: Family Medicine

## 2024-07-09 ENCOUNTER — Encounter (HOSPITAL_COMMUNITY): Payer: Self-pay

## 2024-07-09 DIAGNOSIS — Z1231 Encounter for screening mammogram for malignant neoplasm of breast: Secondary | ICD-10-CM | POA: Insufficient documentation

## 2024-07-10 ENCOUNTER — Ambulatory Visit: Payer: Self-pay | Admitting: Family Medicine

## 2024-07-13 ENCOUNTER — Telehealth: Payer: Self-pay

## 2024-07-13 NOTE — Telephone Encounter (Signed)
 Copied from CRM #[id number]. Topic: Clinical - Prescription Issue >> Jul 13, 2024  1:18 PM Zebedee SAUNDERS wrote: Reason for CRM:Pt wants Rock Bruns to know that pt stopped taking empagliflozin  (JARDIANCE ) 10 MG TABS tablet and started taking over the counter golden Ditature Glucose please call pt at [phone].

## 2024-07-19 DIAGNOSIS — E1142 Type 2 diabetes mellitus with diabetic polyneuropathy: Secondary | ICD-10-CM | POA: Diagnosis not present

## 2024-07-19 DIAGNOSIS — L84 Corns and callosities: Secondary | ICD-10-CM | POA: Diagnosis not present

## 2024-07-19 DIAGNOSIS — B351 Tinea unguium: Secondary | ICD-10-CM | POA: Diagnosis not present

## 2024-07-19 DIAGNOSIS — M79675 Pain in left toe(s): Secondary | ICD-10-CM | POA: Diagnosis not present

## 2024-07-19 DIAGNOSIS — M79674 Pain in right toe(s): Secondary | ICD-10-CM | POA: Diagnosis not present

## 2024-07-30 DIAGNOSIS — E1122 Type 2 diabetes mellitus with diabetic chronic kidney disease: Secondary | ICD-10-CM | POA: Diagnosis not present

## 2024-07-30 DIAGNOSIS — N1832 Chronic kidney disease, stage 3b: Secondary | ICD-10-CM | POA: Diagnosis not present

## 2024-07-30 DIAGNOSIS — I129 Hypertensive chronic kidney disease with stage 1 through stage 4 chronic kidney disease, or unspecified chronic kidney disease: Secondary | ICD-10-CM | POA: Diagnosis not present

## 2024-08-08 ENCOUNTER — Ambulatory Visit (INDEPENDENT_AMBULATORY_CARE_PROVIDER_SITE_OTHER): Admitting: Family Medicine

## 2024-08-08 ENCOUNTER — Encounter: Payer: Self-pay | Admitting: Family Medicine

## 2024-08-08 ENCOUNTER — Ambulatory Visit: Payer: Self-pay | Admitting: Family Medicine

## 2024-08-08 VITALS — BP 137/71 | HR 71 | Temp 96.6°F | Ht 66.0 in | Wt 131.2 lb

## 2024-08-08 DIAGNOSIS — E1169 Type 2 diabetes mellitus with other specified complication: Secondary | ICD-10-CM | POA: Diagnosis not present

## 2024-08-08 DIAGNOSIS — E1159 Type 2 diabetes mellitus with other circulatory complications: Secondary | ICD-10-CM

## 2024-08-08 DIAGNOSIS — E785 Hyperlipidemia, unspecified: Secondary | ICD-10-CM | POA: Diagnosis not present

## 2024-08-08 DIAGNOSIS — I152 Hypertension secondary to endocrine disorders: Secondary | ICD-10-CM

## 2024-08-08 DIAGNOSIS — Z7984 Long term (current) use of oral hypoglycemic drugs: Secondary | ICD-10-CM

## 2024-08-08 DIAGNOSIS — M25552 Pain in left hip: Secondary | ICD-10-CM | POA: Diagnosis not present

## 2024-08-08 DIAGNOSIS — D472 Monoclonal gammopathy: Secondary | ICD-10-CM | POA: Insufficient documentation

## 2024-08-08 DIAGNOSIS — J301 Allergic rhinitis due to pollen: Secondary | ICD-10-CM | POA: Diagnosis not present

## 2024-08-08 DIAGNOSIS — I10 Essential (primary) hypertension: Secondary | ICD-10-CM | POA: Diagnosis not present

## 2024-08-08 LAB — CMP14+EGFR
ALT: 18 IU/L (ref 0–32)
AST: 19 IU/L (ref 0–40)
Albumin: 4.1 g/dL (ref 3.6–4.6)
Alkaline Phosphatase: 109 IU/L (ref 44–121)
BUN/Creatinine Ratio: 30 — ABNORMAL HIGH (ref 12–28)
BUN: 44 mg/dL — ABNORMAL HIGH (ref 10–36)
Bilirubin Total: 0.4 mg/dL (ref 0.0–1.2)
CO2: 23 mmol/L (ref 20–29)
Calcium: 10 mg/dL (ref 8.7–10.3)
Chloride: 101 mmol/L (ref 96–106)
Creatinine, Ser: 1.46 mg/dL — ABNORMAL HIGH (ref 0.57–1.00)
Globulin, Total: 2.6 g/dL (ref 1.5–4.5)
Glucose: 167 mg/dL — ABNORMAL HIGH (ref 70–99)
Potassium: 4.6 mmol/L (ref 3.5–5.2)
Sodium: 140 mmol/L (ref 134–144)
Total Protein: 6.7 g/dL (ref 6.0–8.5)
eGFR: 34 mL/min/1.73 — ABNORMAL LOW (ref >59)

## 2024-08-08 LAB — CBC WITH DIFFERENTIAL/PLATELET
Basophils Absolute: 0 x10E3/uL (ref 0.0–0.2)
Basos: 1 %
EOS (ABSOLUTE): 0.1 x10E3/uL (ref 0.0–0.4)
Eos: 1 %
Hematocrit: 37.7 % (ref 34.0–46.6)
Hemoglobin: 12 g/dL (ref 11.1–15.9)
Immature Grans (Abs): 0 x10E3/uL (ref 0.0–0.1)
Immature Granulocytes: 0 %
Lymphocytes Absolute: 1.5 x10E3/uL (ref 0.7–3.1)
Lymphs: 17 %
MCH: 32.4 pg (ref 26.6–33.0)
MCHC: 31.8 g/dL (ref 31.5–35.7)
MCV: 102 fL — ABNORMAL HIGH (ref 79–97)
Monocytes Absolute: 0.5 x10E3/uL (ref 0.1–0.9)
Monocytes: 5 %
Neutrophils Absolute: 6.6 x10E3/uL (ref 1.4–7.0)
Neutrophils: 76 %
Platelets: 222 x10E3/uL (ref 150–450)
RBC: 3.7 x10E6/uL — ABNORMAL LOW (ref 3.77–5.28)
RDW: 12.2 % (ref 11.7–15.4)
WBC: 8.7 x10E3/uL (ref 3.4–10.8)

## 2024-08-08 LAB — BAYER DCA HB A1C WAIVED: HB A1C (BAYER DCA - WAIVED): 7.7 % — ABNORMAL HIGH (ref 4.8–5.6)

## 2024-08-08 MED ORDER — HYDROCHLOROTHIAZIDE 25 MG PO TABS: 25.0000 mg | ORAL_TABLET | Freq: Every day | ORAL | 3 refills | Status: AC

## 2024-08-08 NOTE — Progress Notes (Signed)
 Subjective:  Patient ID: Candice Brewer English, female    DOB: May 04, 1932, 89 y.o.   MRN: [id number]  Patient Care Team: Severa Rock HERO, FNP as PCP - General (Family Medicine) Inocencio Soyla Lunger, MD as PCP - Electrophysiology (Cardiology)   Chief Complaint:  Diabetes (3 month follow up )   HPI: Candice Brewer is a 89 y.o. female presenting on 08/08/2024 for Diabetes (3 month follow up )    Candice Brewer is a 89 year old female who presents with a sore throat and nasal congestion.  She began experiencing a sore throat last night, describing it as sore. She attempted to alleviate the discomfort by gargling with a mixture of peroxide and water . This morning, she started coughing up phlegm and noticed her nose began running yesterday. She has a history of seasonal allergies, which typically cause her nose to run in the spring or fall. She has been taking Claritin, as recommended by her daughter-in-law, to manage these symptoms.  She has diabetes and was previously taking Jardiance  but stopped on the 15th of last month. Since then, she has not been taking any prescribed diabetes medication, only vitamins and a product called Golden Vita Pure. Her most recent A1c is 7.7; a previous value was 8.4.  She has a history of chronic left hip pain and has seen an orthopedic specialist for this issue. She is awaiting further evaluation for monoclonal gammopathy of undetermined significance (MGUS).  She reports swelling in her left foot, which has been present for an unspecified duration. She is not currently wearing compression socks. No issues with her bunion at present.          Relevant past medical, surgical, family, and social history reviewed and updated as indicated.  Allergies and medications reviewed and updated. Data reviewed: Chart in Epic.   Past Medical History:  Diagnosis Date   Arthritis    Chest pain, unspecified    Constipation    DM (diabetes mellitus) (HCC)    Dry eyes     Esophageal stricture 09/2011   schatzki's ring   Fibroids    uterus   Glaucoma    Helicobacter pylori gastritis 2012   Treated   HTN (hypertension)    Hyperlipidemia    Restless legs syndrome (RLS)    Vitamin D  deficiency     Past Surgical History:  Procedure Laterality Date   APPENDECTOMY     BREAST BIOPSY Left 03/20/2018   Procedure: BREAST BIOPSY WITH NEEDLE LOCALIZATION;  Surgeon: Kallie Manuelita BROCKS, MD;  Location: [facility];  Service: General;  Laterality: Left;   BREAST EXCISIONAL BIOPSY Left 2019   Atypical hyperplasia removed   CARDIOVASCULAR STRESS TEST  2009   CATARACT EXTRACTION  2010   right eye   CATARACT EXTRACTION  2012   left eye   COLONOSCOPY  08/2006   Dr. Carleene   COLONOSCOPY  10/13/2011   Normal. Next TCS 09/2016.   Procedure: COLONOSCOPY;  Surgeon: Lamar HERO Hollingshead, MD;  Location: [facility];  Service: Endoscopy;  Laterality: N/A;  8:15   COLONOSCOPY N/A 03/10/2015   RMR: External and anal canal hemorrhoids likely source of hematochezia. Redundant colon. Single sigmoid polyp removed ad described above.    ESOPHAGOGASTRODUODENOSCOPY  06/2008   Dr. Belen ring s/p disruption, erosive reflux esophagitis, hh.    ESOPHAGOGASTRODUODENOSCOPY  09/2011   Schatki's ring s/p dilation, multiple 1-52mm antral and bulbar erosions, bx positive for H.Pylori s/ treatment   TUBAL LIGATION  Social History   Socioeconomic History   Marital status: Divorced    Spouse name: Not on file   Number of children: 5   Years of education: Not on file   Highest education level: Never attended school  Occupational History   Occupation: retired Psychologist, occupational  Tobacco Use   Smoking status: Never   Smokeless tobacco: Never   Tobacco comments:    quit about 35+ yrs  Vaping Use   Vaping status: Never Used  Substance and Sexual Activity   Alcohol  use: No    Alcohol /week: 0.0 standard drinks of alcohol    Drug use: No   Sexual activity: Not Currently  Other Topics  Concern   Not on file  Social History Narrative   Not on file   Social Drivers of Health   Financial Resource Strain: Low Risk  (08/05/2024)   Overall Financial Resource Strain (CARDIA)    Difficulty of Paying Living Expenses: Not very hard  Food Insecurity: No Food Insecurity (08/05/2024)   Hunger Vital Sign    Worried About Running Out of Food in the Last Year: Never true    Ran Out of Food in the Last Year: Never true  Transportation Needs: No Transportation Needs (08/05/2024)   PRAPARE - Administrator, Civil Service (Medical): No    Lack of Transportation (Non-Medical): No  Physical Activity: Insufficiently Active (08/05/2024)   Exercise Vital Sign    Days of Exercise per Week: 5 days    Minutes of Exercise per Session: 10 min  Stress: No Stress Concern Present (08/05/2024)   [employer] of Occupational Health - Occupational Stress Questionnaire    Feeling of Stress: Not at all  Social Connections: Moderately Integrated (08/05/2024)   Social Connection and Isolation Panel    Frequency of Communication with Friends and Family: More than three times a week    Frequency of Social Gatherings with Friends and Family: More than three times a week    Attends Religious Services: More than 4 times per year    Active Member of [employer] or Organizations: Yes    Attends Engineer, structural: More than 4 times per year    Marital Status: Divorced  Intimate Partner Violence: Not At Risk (04/27/2024)   Humiliation, Afraid, Rape, and Kick questionnaire    Fear of Current or Ex-Partner: No    Emotionally Abused: No    Physically Abused: No    Sexually Abused: No    Outpatient Encounter Medications as of 08/08/2024  Medication Sig   aspirin  EC 81 MG tablet Take 81 mg by mouth daily.   brimonidine -timolol  (COMBIGAN ) 0.2-0.5 % ophthalmic solution Place 1 drop into both eyes 2 (two) times daily.    cholecalciferol (VITAMIN D ) 1000 units tablet Take 1,000 Units by mouth daily.    hypromellose (SYSTANE NIGHT) 0.3 % GEL ophthalmic ointment Place 1 Application into both eyes at bedtime.   Multiple Vitamin (MULTIVITAMIN WITH MINERALS) TABS tablet Take 1 tablet by mouth daily.   OVER THE COUNTER MEDICATION Apply 1 Application topically as needed (pain). Amish gel   Polyethyl Glycol-Propyl Glycol (SYSTANE) 0.4-0.3 % SOLN Place 1 drop into both eyes as needed (dry eyes).   valACYclovir  (VALTREX ) 500 MG tablet Take 500 mg by mouth daily.   VYZULTA  0.024 % SOLN Apply 1 drop to eye at bedtime.   [DISCONTINUED] empagliflozin  (JARDIANCE ) 10 MG TABS tablet Take 10 mg by mouth daily.   [DISCONTINUED] hydrochlorothiazide  (HYDRODIURIL ) 25 MG tablet TAKE ONE TABLET ONCE  DAILY   [DISCONTINUED] valACYclovir  (VALTREX ) 1000 MG tablet Take 500 mg by mouth daily. Daughter reports patient taking 500mg  once daily - will discuss with PCP 05/02/24   hydrochlorothiazide  (HYDRODIURIL ) 25 MG tablet Take 1 tablet (25 mg total) by mouth daily.   [DISCONTINUED] cephALEXin (KEFLEX) 500 MG capsule Take 500 mg by mouth 3 (three) times daily. Take until completed. (Patient not taking: Reported on 05/28/2024)   [DISCONTINUED] gabapentin  (NEURONTIN ) 100 MG capsule Take 1 capsule (100 mg total) by mouth at bedtime.   [DISCONTINUED] meclizine  (ANTIVERT ) 25 MG tablet Take 1 tablet (25 mg total) by mouth 3 (three) times daily as needed for dizziness.   [DISCONTINUED] mupirocin  cream (BACTROBAN ) 2 % Apply 1 Application topically 2 (two) times daily.   No facility-administered encounter medications on file as of 08/08/2024.    Allergies  Allergen Reactions   Pravastatin Sodium Hives and Other (See Comments)    Funny feeling and leg pain    REACTION: Funny feeling and leg pain    REACTION: Funny feeling and leg pain REACTION: Funny feeling and leg pain REACTION: Funny feeling and leg pain   Benzalkonium Other (See Comments)    Unknown    Benzalkonium Chloride Other (See Comments)    Unknown    Metformin And Related Other (See Comments)    Contraindicated due to chronic kidney disease GFR 30s   Neomy-Bacit-Polymyx-Pramoxine Rash   Neomycin-Bacitracin Zn-Polymyx Rash    Severe skin rash   Rosuvastatin Rash    Pertinent ROS per HPI, otherwise unremarkable      Objective:  BP 137/71   Pulse 71   Temp (!) 96.6 F (35.9 C)   Ht 5' 6 (1.676 m)   Wt 131 lb 3.2 oz (59.5 kg)   BMI 21.18 kg/m    Wt Readings from Last 3 Encounters:  08/08/24 131 lb 3.2 oz (59.5 kg)  05/28/24 120 lb (54.4 kg)  05/02/24 128 lb 12.8 oz (58.4 kg)    Physical Exam Vitals and nursing note reviewed.  Constitutional:      General: She is not in acute distress.    Appearance: Normal appearance. She is well-developed, well-groomed and normal weight. She is not ill-appearing, toxic-appearing or diaphoretic.  HENT:     Head: Normocephalic and atraumatic.     Jaw: There is normal jaw occlusion.     Right Ear: Hearing normal. A middle ear effusion is present. Tympanic membrane is not erythematous.     Left Ear: Hearing normal. A middle ear effusion is present. Tympanic membrane is not erythematous.     Nose: Congestion present.     Right Turbinates: Enlarged.     Left Turbinates: Enlarged.     Mouth/Throat:     Lips: Pink.     Mouth: Mucous membranes are moist.     Pharynx: Oropharynx is clear. Uvula midline. Postnasal drip present. No posterior oropharyngeal erythema.  Eyes:     General: Lids are normal.     Extraocular Movements: Extraocular movements intact.     Conjunctiva/sclera: Conjunctivae normal.     Pupils: Pupils are equal, round, and reactive to light.  Neck:     Thyroid : No thyroid  mass, thyromegaly or thyroid  tenderness.     Vascular: No carotid bruit or JVD.     Trachea: Trachea and phonation normal.  Cardiovascular:     Rate and Rhythm: Normal rate and regular rhythm.     Chest Wall: PMI is not displaced.     Pulses: Normal pulses.  Dorsalis pedis pulses are 2+ on  the right side and 2+ on the left side.       Posterior tibial pulses are 2+ on the right side and 2+ on the left side.     Heart sounds: Normal heart sounds. No murmur heard.    No friction rub. No gallop.  Pulmonary:     Effort: Pulmonary effort is normal. No respiratory distress.     Breath sounds: Normal breath sounds. No wheezing.  Abdominal:     General: Bowel sounds are normal. There is no distension or abdominal bruit.     Palpations: Abdomen is soft. There is no hepatomegaly or splenomegaly.     Tenderness: There is no abdominal tenderness. There is no right CVA tenderness or left CVA tenderness.     Hernia: No hernia is present.  Musculoskeletal:        General: Normal range of motion.     Cervical back: Normal range of motion and neck supple.     Right lower leg: No edema.     Left lower leg: Edema present.     Right foot: Normal range of motion. Bunion present. No deformity, Charcot foot, foot drop or prominent metatarsal heads.     Left foot: Bunion present.       Feet:  Feet:     Right foot:     Protective Sensation: 10 sites tested.  10 sites sensed.     Skin integrity: Skin integrity normal.     Toenail Condition: Right toenails are normal.     Left foot:     Protective Sensation: 10 sites tested.  10 sites sensed.     Skin integrity: Dry skin present. No ulcer, blister, skin breakdown, erythema, warmth, callus or fissure.     Toenail Condition: Left toenails are normal.  Lymphadenopathy:     Cervical: No cervical adenopathy.  Skin:    General: Skin is warm and dry.     Capillary Refill: Capillary refill takes less than 2 seconds.     Coloration: Skin is not cyanotic, jaundiced or pale.     Findings: No rash.  Neurological:     General: No focal deficit present.     Mental Status: She is alert and oriented to person, place, and time.     Sensory: Sensation is intact.     Motor: Motor function is intact.     Coordination: Coordination is intact.     Gait:  Gait abnormal (using rolling walker).     Deep Tendon Reflexes: Reflexes are normal and symmetric.  Psychiatric:        Attention and Perception: Attention and perception normal.        Mood and Affect: Mood and affect normal.        Speech: Speech normal.        Behavior: Behavior normal. Behavior is cooperative.        Thought Content: Thought content normal.        Cognition and Memory: Cognition and memory normal.        Judgment: Judgment normal.     Results for orders placed or performed in visit on 05/02/24  BMP8+EGFR   Collection Time: 05/02/24  9:36 AM  Result Value Ref Range   Glucose 265 (H) 70 - 99 mg/dL   BUN 57 (H) 10 - 36 mg/dL   Creatinine, Ser 8.49 (H) 0.57 - 1.00 mg/dL   eGFR 33 (L) >40 fO/fpw/8.26   BUN/Creatinine Ratio 38 (H)  12 - 28   Sodium 135 134 - 144 mmol/L   Potassium 4.4 3.5 - 5.2 mmol/L   Chloride 95 (L) 96 - 106 mmol/L   CO2 20 20 - 29 mmol/L   Calcium 9.9 8.7 - 10.3 mg/dL  CBC with Differential/Platelet   Collection Time: 05/02/24  9:36 AM  Result Value Ref Range   WBC 9.3 3.4 - 10.8 x10E3/uL   RBC 3.77 3.77 - 5.28 x10E6/uL   Hemoglobin 12.7 11.1 - 15.9 g/dL   Hematocrit 59.7 65.9 - 46.6 %   MCV 107 (H) 79 - 97 fL   MCH 33.7 (H) 26.6 - 33.0 pg   MCHC 31.6 31.5 - 35.7 g/dL   RDW 87.6 88.2 - 84.5 %   Platelets 250 150 - 450 x10E3/uL   Neutrophils 78 Not Estab. %   Lymphs 15 Not Estab. %   Monocytes 6 Not Estab. %   Eos 1 Not Estab. %   Basos 0 Not Estab. %   Neutrophils Absolute 7.3 (H) 1.4 - 7.0 x10E3/uL   Lymphocytes Absolute 1.4 0.7 - 3.1 x10E3/uL   Monocytes Absolute 0.5 0.1 - 0.9 x10E3/uL   EOS (ABSOLUTE) 0.1 0.0 - 0.4 x10E3/uL   Basophils Absolute 0.0 0.0 - 0.2 x10E3/uL   Immature Granulocytes 0 Not Estab. %   Immature Grans (Abs) 0.0 0.0 - 0.1 x10E3/uL       Pertinent labs & imaging results that were available during my care of the patient were reviewed by me and considered in my medical decision making.  Assessment & Plan:   Edelin was seen today for diabetes.  Diagnoses and all orders for this visit:  Type 2 diabetes mellitus with other specified complication, without long-term current use of insulin  (HCC) -     Bayer DCA Hb A1c Waived -     CBC with Differential/Platelet -     CMP14+EGFR -     For home use only DME Other see comment  Hypertension associated with diabetes (HCC) -     Bayer DCA Hb A1c Waived -     CBC with Differential/Platelet -     CMP14+EGFR -     hydrochlorothiazide  (HYDRODIURIL ) 25 MG tablet; Take 1 tablet (25 mg total) by mouth daily. -     For home use only DME Other see comment  Hyperlipidemia associated with type 2 diabetes mellitus (HCC) -     Bayer DCA Hb A1c Waived -     CBC with Differential/Platelet -     CMP14+EGFR -     For home use only DME Other see comment  Seasonal allergic rhinitis due to pollen -     CBC with Differential/Platelet  MGUS (monoclonal gammopathy of unknown significance) -     CBC with Differential/Platelet  Pain in left hip -     CMP14+EGFR       Type 2 diabetes mellitus, uncontrolled A1c is 7.7, improved from 8.4, but still above target of 7. She has stopped taking Jardiance  and is not on any diabetes medication currently. She is using vitamins and herbal supplements, but these are not clinically proven to manage diabetes effectively. There is a need to lower A1c to below 7 to proceed with hip surgery. She is hesitant to take pharmaceutical medications due to concerns about commercial interests and prefers homeopathic treatments. Risks and benefits of medications were discussed, emphasizing the need for evidence-based treatment to achieve target A1c. - Recommend restarting diabetes medication to lower A1c below 7 for  hip surgery eligibility. - Research evidence-based homeopathic treatments for diabetes management and send information via MyChart.  Chronic left hip pain Chronic pain in the left hip. Hip surgery is contingent on lowering A1c  below 7. Current A1c is 7.7, which is an improvement but still above the target. - Reassess eligibility for hip surgery once A1c is below 7.  Left foot swelling Swelling in the left foot, not currently using compression socks. - Order diabetic shoes and compression socks. - Advise wearing knee-high compression socks to reduce swelling.  Essential hypertension Blood pressure readings are good without medication. Previously taken off blood pressure medication by the hospital.  Monoclonal gammopathy of undetermined significance (MGUS) Awaiting follow-up appointment with a specialist for MGUS. Referral to a specialist at [facility] is pending, likely to be Dr. Carie who specializes in blood disorders. - If no contact from the specialist by the end of the week, follow up to ensure referral is in place.  Allergic rhinitis Experiencing sore throat, runny nose, and phlegm production. Currently taking Claritin, but symptoms persist. Advised to switch antihistamines every 4-6 months to prevent tolerance. - Consider switching from Claritin to Xyzal for better symptom control.      I spent 50 minutes dedicated to the care of this patient on the date of this encounter to include pre-visit review of records including diagnostic studies and labs, face-to-face time with the patient discussing above conditions, post visit ordering of testing, clinical documentation in the electronic health record, making appropriate referrals as documented, and communicating necessary information to the patient's healthcare team.      Continue all other maintenance medications.  Follow up plan: Return in about 3 months (around 11/07/2024) for chronic follow up.   Continue healthy lifestyle choices, including diet (rich in fruits, vegetables, and lean proteins, and low in salt and simple carbohydrates) and exercise (at least 30 minutes of moderate physical activity daily).  Educational handout given for DM  The  above assessment and management plan was discussed with the patient. The patient verbalized understanding of and has agreed to the management plan. Patient is aware to call the clinic if they develop any new symptoms or if symptoms persist or worsen. Patient is aware when to return to the clinic for a follow-up visit. Patient educated on when it is appropriate to go to the emergency department.   Candice Bruns, FNP-C [facility] [phone]

## 2024-08-08 NOTE — Patient Instructions (Signed)

## 2024-08-09 ENCOUNTER — Telehealth: Payer: Self-pay

## 2024-08-09 NOTE — Addendum Note (Signed)
 Addended by: Jesseca Marsch D on: 08/09/2024 10:27 AM   Modules accepted: Orders

## 2024-08-09 NOTE — Telephone Encounter (Signed)
 New Diabetic shoe order signed by PCP & Dr. Zollie faxed to [facility], with OV notes. Pt aware

## 2024-08-09 NOTE — Telephone Encounter (Signed)
 Copied from CRM #[id number]. Topic: Clinical - Order For Equipment >> Aug 09, 2024  9:21 AM Zane F wrote: Reason for CRM:   Under Other Orders: For home use only DME Other see comment (Order [id number]) Dated for Yesterday 08/08/2024  Pharmacy:   [facility] [place], [place] - [address] [place] [address] Phone: [phone] Fax: [phone] Hours: Not open 24 hours  Patient is calling in to express that the document listed above was suppose to be signed and sent over to [facility] by the provider and cannot be brought over physically by the patient.   Please submit electronic submission to [facility] so the patient can pick up her diabetic shoes as soon as possible. Please call patient once this has been completed.  Callback Number: [phone]

## 2024-08-23 ENCOUNTER — Telehealth: Payer: Self-pay | Admitting: Family Medicine

## 2024-08-23 NOTE — Telephone Encounter (Signed)
 FYI- patient taking Glucosil for her sugar

## 2024-08-23 NOTE — Telephone Encounter (Signed)
 Copied from CRM [id number]. Topic: Clinical - Medical Advice >> Aug 23, 2024 11:13 AM Candice Brewer wrote: Glucocil- pt was told to call with the name of the prescription

## 2024-09-04 ENCOUNTER — Other Ambulatory Visit: Payer: Self-pay | Admitting: Family Medicine

## 2024-09-04 DIAGNOSIS — E1159 Type 2 diabetes mellitus with other circulatory complications: Secondary | ICD-10-CM

## 2024-09-04 DIAGNOSIS — E1169 Type 2 diabetes mellitus with other specified complication: Secondary | ICD-10-CM

## 2024-09-11 DIAGNOSIS — H18422 Band keratopathy, left eye: Secondary | ICD-10-CM | POA: Diagnosis not present

## 2024-09-21 ENCOUNTER — Encounter: Payer: Self-pay | Admitting: Oncology

## 2024-09-21 ENCOUNTER — Inpatient Hospital Stay

## 2024-09-21 ENCOUNTER — Inpatient Hospital Stay: Attending: Oncology | Admitting: Oncology

## 2024-09-21 VITALS — BP 145/72 | HR 65 | Temp 97.6°F | Resp 18 | Ht 66.54 in | Wt 131.4 lb

## 2024-09-21 DIAGNOSIS — N1832 Chronic kidney disease, stage 3b: Secondary | ICD-10-CM | POA: Diagnosis not present

## 2024-09-21 DIAGNOSIS — R778 Other specified abnormalities of plasma proteins: Secondary | ICD-10-CM | POA: Diagnosis present

## 2024-09-21 DIAGNOSIS — Z800 Family history of malignant neoplasm of digestive organs: Secondary | ICD-10-CM

## 2024-09-21 DIAGNOSIS — I129 Hypertensive chronic kidney disease with stage 1 through stage 4 chronic kidney disease, or unspecified chronic kidney disease: Secondary | ICD-10-CM | POA: Diagnosis not present

## 2024-09-21 DIAGNOSIS — Z809 Family history of malignant neoplasm, unspecified: Secondary | ICD-10-CM

## 2024-09-21 DIAGNOSIS — E1122 Type 2 diabetes mellitus with diabetic chronic kidney disease: Secondary | ICD-10-CM

## 2024-09-21 DIAGNOSIS — R7689 Other specified abnormal immunological findings in serum: Secondary | ICD-10-CM

## 2024-09-21 DIAGNOSIS — D472 Monoclonal gammopathy: Secondary | ICD-10-CM

## 2024-09-21 LAB — CBC WITH DIFFERENTIAL/PLATELET
Abs Immature Granulocytes: 0.02 K/uL (ref 0.00–0.07)
Basophils Absolute: 0 K/uL (ref 0.0–0.1)
Basophils Relative: 1 %
Eosinophils Absolute: 0.1 K/uL (ref 0.0–0.5)
Eosinophils Relative: 1 %
HCT: 35.8 % — ABNORMAL LOW (ref 36.0–46.0)
Hemoglobin: 11.7 g/dL — ABNORMAL LOW (ref 12.0–15.0)
Immature Granulocytes: 0 %
Lymphocytes Relative: 18 %
Lymphs Abs: 1.5 K/uL (ref 0.7–4.0)
MCH: 32.9 pg (ref 26.0–34.0)
MCHC: 32.7 g/dL (ref 30.0–36.0)
MCV: 100.6 fL — ABNORMAL HIGH (ref 80.0–100.0)
Monocytes Absolute: 0.4 K/uL (ref 0.1–1.0)
Monocytes Relative: 5 %
Neutro Abs: 6.6 K/uL (ref 1.7–7.7)
Neutrophils Relative %: 75 %
Platelets: 234 K/uL (ref 150–400)
RBC: 3.56 MIL/uL — ABNORMAL LOW (ref 3.87–5.11)
RDW: 13.3 % (ref 11.5–15.5)
WBC: 8.7 K/uL (ref 4.0–10.5)
nRBC: 0 % (ref 0.0–0.2)

## 2024-09-21 LAB — COMPREHENSIVE METABOLIC PANEL WITH GFR
ALT: 22 U/L (ref 0–44)
AST: 24 U/L (ref 15–41)
Albumin: 4.3 g/dL (ref 3.5–5.0)
Alkaline Phosphatase: 114 U/L (ref 38–126)
Anion gap: 11 (ref 5–15)
BUN: 45 mg/dL — ABNORMAL HIGH (ref 8–23)
CO2: 26 mmol/L (ref 22–32)
Calcium: 9.9 mg/dL (ref 8.9–10.3)
Chloride: 99 mmol/L (ref 98–111)
Creatinine, Ser: 1.26 mg/dL — ABNORMAL HIGH (ref 0.44–1.00)
GFR, Estimated: 40 mL/min — ABNORMAL LOW (ref >60)
Glucose, Bld: 214 mg/dL — ABNORMAL HIGH (ref 70–99)
Potassium: 4.2 mmol/L (ref 3.5–5.1)
Sodium: 135 mmol/L (ref 135–145)
Total Bilirubin: 0.5 mg/dL (ref 0.0–1.2)
Total Protein: 7.7 g/dL (ref 6.5–8.1)

## 2024-09-21 LAB — LACTATE DEHYDROGENASE: LDH: 219 U/L — ABNORMAL HIGH (ref 98–192)

## 2024-09-21 NOTE — Progress Notes (Addendum)
 [facility]  Telephone:([phone]) [phone] Fax:([phone]    INITIAL HEMATOLOGY CONSULTATION   Referring Provider: Dr. Gaynell Clos   Reason for Referral: Abnormal SPEP  HPI: Candice Brewer is a 89 year old female who is seen today for initial evaluation for an elevated M spike and elevated serum light chains.  Candice has a past medical history significant for stage IIIb chronic kidney disease, diabetes mellitus, hyperlipidemia, hypertension, vitamin D  deficiency.  The patient is currently being followed by nephrology for her chronic kidney disease.  Candice had a serum protein electrophoresis performed on 8//2025 which showed normal IgG, IgA, IgM levels.  The M spike was elevated.  Immunofixation showed an IgA monoclonal protein with kappa light chain specificity.  The serum free kappa light chains were elevated at 111.1, serum free lambda light chains were normal at 22.2, and the serum kappa/lambda ratio was elevated at 5.0.  The creatinine on this same date was elevated at 1.46 with an eGFR of 34.  Calcium level was elevated at 10.4 with a normal albumin of 4.4.  CBC from this date showed a WBC of 7.1, hemoglobin 13.6, MCV 104, platelets 256,000, iron level was elevated at 146 but the remainder of the iron panel was normal.  Hepatitis B and hepatitis C testing were negative.  Vitamin B12 level was 904 and folate level was greater than 20.  Her hemoglobin A1c was elevated at 8.2%.  Candice had a random urine immunofixation was ordered and the immunofixation pattern appears normal and evidence of a monoclonal protein is not apparent.  Candice has not completed 24-hour urine.  Patient presents to the office today accompanied by her daughter.  Overall, Candice reports that Candice is feeling well.  Her biggest complaint is of left hip pain which has been ongoing for many years.  Candice has been evaluated by orthopedics in the past and has been told that her left hip pain is due to osteoarthritis.  Candice had  imaging performed at [facility] which did not show any acute abnormalities.  Candice currently denies any significant fatigue, loss of appetite, unintentional weight loss, fevers, chills, night sweats.  Candice is not experiencing any chest discomfort, shortness of breath, abdominal pain, nausea, vomiting, constipation, diarrhea, bleeding.  Today, Candice is in a wheelchair but normally Candice uses a walker.  Her daughter states that her gait is slow but overall Candice is fairly independent to use of her walker.  Past Medical History:  Diagnosis Date   Arthritis    Chest pain, unspecified    Chronic kidney disease    Constipation    DM (diabetes mellitus) (HCC)    Dry eyes    Esophageal stricture 09/2011   schatzki's ring   Fibroids    uterus   Glaucoma    Helicobacter pylori gastritis 2012   Treated   HTN (hypertension)    Hyperlipidemia    Restless legs syndrome (RLS)    Vitamin D  deficiency   :  Past Surgical History:  Procedure Laterality Date   APPENDECTOMY     BREAST Brewer Left 03/20/2018   Procedure: BREAST Brewer WITH NEEDLE LOCALIZATION;  Surgeon: Kallie Manuelita BROCKS, MD;  Location: [facility];  Service: General;  Laterality: Left;   BREAST EXCISIONAL Brewer Left 2019   Atypical hyperplasia removed   CARDIOVASCULAR STRESS TEST  2009   CATARACT EXTRACTION  2010   right eye   CATARACT EXTRACTION  2012   left eye   COLONOSCOPY  08/2006   Dr. Carleene  COLONOSCOPY  10/13/2011   Normal. Next TCS 09/2016.   Procedure: COLONOSCOPY;  Surgeon: Lamar CHRISTELLA Hollingshead, MD;  Location: [facility];  Service: Endoscopy;  Laterality: N/A;  8:15   COLONOSCOPY N/A 03/10/2015   RMR: External and anal canal hemorrhoids likely source of hematochezia. Redundant colon. Single sigmoid polyp removed ad described above.    ESOPHAGOGASTRODUODENOSCOPY  06/2008   Dr. Belen ring s/p disruption, erosive reflux esophagitis, hh.    ESOPHAGOGASTRODUODENOSCOPY  09/2011   Schatki's ring s/p dilation, multiple  1-3mm antral and bulbar erosions, bx positive for H.Pylori s/ treatment   TUBAL LIGATION    :  CURRENT MEDS: Current Outpatient Medications  Medication Sig Dispense Refill   aspirin  EC 81 MG tablet Take 81 mg by mouth daily.     brimonidine -timolol  (COMBIGAN ) 0.2-0.5 % ophthalmic solution Place 1 drop into both eyes 2 (two) times daily.      cholecalciferol (VITAMIN D ) 1000 units tablet Take 1,000 Units by mouth daily.     hydrochlorothiazide  (HYDRODIURIL ) 25 MG tablet Take 1 tablet (25 mg total) by mouth daily. 90 tablet 3   hypromellose (SYSTANE NIGHT) 0.3 % GEL ophthalmic ointment Place 1 Application into both eyes at bedtime.     Multiple Vitamin (MULTIVITAMIN WITH MINERALS) TABS tablet Take 1 tablet by mouth daily.     OVER THE COUNTER MEDICATION Apply 1 Application topically as needed (pain). Amish gel     Polyethyl Glycol-Propyl Glycol (SYSTANE) 0.4-0.3 % SOLN Place 1 drop into both eyes as needed (dry eyes).     valACYclovir  (VALTREX ) 500 MG tablet Take 500 mg by mouth daily.     VYZULTA  0.024 % SOLN Apply 1 drop to eye at bedtime.     No current facility-administered medications for this visit.    Allergies  Allergen Reactions   Pravastatin Sodium Hives and Other (See Comments)    Funny feeling and leg pain    REACTION: Funny feeling and leg pain    REACTION: Funny feeling and leg pain REACTION: Funny feeling and leg pain REACTION: Funny feeling and leg pain   Benzalkonium Other (See Comments)    Unknown    Benzalkonium Chloride Other (See Comments)    Unknown   Metformin And Related Other (See Comments)    Contraindicated due to chronic kidney disease GFR 30s   Neomy-Bacit-Polymyx-Pramoxine Rash   Neomycin-Bacitracin Zn-Polymyx Rash    Severe skin rash   Rosuvastatin Rash  :   Family History  Problem Relation Age of Onset   Prostate cancer Father    Colon cancer Father        >age 68   Cancer Paternal Grandmother        metastatic at time of  diagnosis, primary unknown   Cancer Paternal Aunt        metastatic at time of diagnosisi, primary unknown  :  Social History   Socioeconomic History   Marital status: Divorced    Spouse name: Not on file   Number of children: 5   Years of education: Not on file   Highest education level: Never attended school  Occupational History   Occupation: retired Psychologist, occupational  Tobacco Use   Smoking status: Never   Smokeless tobacco: Never   Tobacco comments:    quit about 35+ yrs  Vaping Use   Vaping status: Never Used  Substance and Sexual Activity   Alcohol  use: No    Alcohol /week: 0.0 standard drinks of alcohol    Drug use: No  Sexual activity: Not Currently  Other Topics Concern   Not on file  Social History Narrative   Not on file   Social Drivers of Health   Financial Resource Strain: Low Risk  (08/05/2024)   Overall Financial Resource Strain (CARDIA)    Difficulty of Paying Living Expenses: Not very hard  Food Insecurity: No Food Insecurity (08/05/2024)   Hunger Vital Sign    Worried About Running Out of Food in the Last Year: Never true    Ran Out of Food in the Last Year: Never true  Transportation Needs: No Transportation Needs (08/05/2024)   PRAPARE - Administrator, Civil Service (Medical): No    Lack of Transportation (Non-Medical): No  Physical Activity: Insufficiently Active (08/05/2024)   Exercise Vital Sign    Days of Exercise per Week: 5 days    Minutes of Exercise per Session: 10 min  Stress: No Stress Concern Present (08/05/2024)   [employer] of Occupational Health - Occupational Stress Questionnaire    Feeling of Stress: Not at all  Social Connections: Moderately Integrated (08/05/2024)   Social Connection and Isolation Panel    Frequency of Communication with Friends and Family: More than three times a week    Frequency of Social Gatherings with Friends and Family: More than three times a week    Attends Religious Services: More than 4 times per  year    Active Member of [employer] or Organizations: Yes    Attends Engineer, structural: More than 4 times per year    Marital Status: Divorced  Catering manager Violence: Not At Risk (04/27/2024)   Humiliation, Afraid, Rape, and Kick questionnaire    Fear of Current or Ex-Partner: No    Emotionally Abused: No    Physically Abused: No    Sexually Abused: No  :  REVIEW OF SYSTEMS:  A comprehensive 14 point review of systems was negative except as noted in the HPI.    Exam: BP (!) 145/72 (BP Location: Right Arm, Patient Position: Sitting)   Pulse 65   Temp 97.6 F (36.4 C) (Oral)   Resp 18   Ht 5' 6.54 (1.69 m)   Wt 131 lb 6.3 oz (59.6 kg)   SpO2 98%   BMI 20.87 kg/m    Physical Exam Vitals reviewed.  Constitutional:      General: Candice is not in acute distress. HENT:     Head: Normocephalic.  Eyes:     General: No scleral icterus.    Conjunctiva/sclera: Conjunctivae normal.  Cardiovascular:     Rate and Rhythm: Normal rate and regular rhythm.  Pulmonary:     Effort: Pulmonary effort is normal. No respiratory distress.     Breath sounds: Normal breath sounds.  Abdominal:     General: Bowel sounds are normal. There is no distension.     Palpations: Abdomen is soft.  Musculoskeletal:        General: Normal range of motion.     Right lower leg: No edema.     Left lower leg: No edema.  Lymphadenopathy:     Cervical: No cervical adenopathy.  Skin:    General: Skin is warm and dry.  Neurological:     Mental Status: Candice is alert and oriented to person, place, and time.  Psychiatric:        Mood and Affect: Mood normal.        Behavior: Behavior normal.        Thought Content: Thought content  normal.        Judgment: Judgment normal.    LABS:  Lab Results  Component Value Date   WBC 8.7 08/08/2024   HGB 12.0 08/08/2024   HCT 37.7 08/08/2024   PLT 222 08/08/2024   GLUCOSE 167 (H) 08/08/2024   CHOL 206 (H) 10/21/2023   TRIG 151 (H) 10/21/2023   HDL 99  10/21/2023   LDLCALC 82 10/21/2023   ALT 18 08/08/2024   AST 19 08/08/2024   NA 140 08/08/2024   K 4.6 08/08/2024   CL 101 08/08/2024   CREATININE 1.46 (H) 08/08/2024   BUN 44 (H) 08/08/2024   CO2 23 08/08/2024   HGBA1C 7.7 (H) 08/08/2024   MICROALBUR 0.85 03/20/2009    No results found.   ASSESSMENT AND PLAN:  1.  Abnormal serum protein electrophoresis/elevated serum light chains Today, I reviewed the workup so far with the patient and her daughter.  I have explained the abnormal findings including the elevated M spike and abnormal immunofixation showing an IgA monoclonal protein with kappa light chain specificity and elevated serum kappa light chain.  I also explained that Candice has abnormal renal function and very mild hypercalcemia.  There was no anemia noted on her last lab work.  We discussed the CRAB criteria which could be indicative of an underlying plasma cell disorder.  The patient does have mild hypercalcemia but this could also be due to hydrochlorothiazide .  He also has chronic kidney disease but also has other contributing factors such as diabetes and hypertension which could also explain this.  Candice does not have anemia.  Candice has had imaging performed July 2025 at [facility] due to left hip pain and I reviewed the x-ray report which showed no bone lesions.  Additionally, Candice had CT imaging and a chest x-ray performed in May 2025 which showed no evidence of bone lesions.  I recommend further evaluation including a repeat CBC, CMP, LDH, beta-2 microglobulin, SPEP, quantitative immunoglobulins, light chains, and 24-hour UPEP/UIFE/light chains.  We also discussed proceeding with a bone survey to evaluate for bone lesions but the patient and her daughter wish to hold off on this for now pending results of the labs and 24-hour urine from today.  I did have some initial discussion with the patient and her daughter today that a bone marrow Brewer would be considered pending the results.  The  patient is not keen on proceeding with a bone marrow Brewer but will readdress this once we get additional workup.  2.  Mild hypercalcemia The patient's calcium level was mildly elevated on outside lab work.  Albumin level was normal.  The patient is on hydrochlorothiazide  and unclear if this is the cause of her hypercalcemia.  Repeat calcium level is pending today.  3.  Stage IIIb chronic kidney disease The patient has known stage IIIb chronic kidney disease.  This is thought to be due to her underlying diabetes and hypertension.  Candice is followed by nephrology.  Repeat renal function is pending today.  Follow-up: In approximately 3 weeks to discuss lab and urine results and make further recommendations.  Would consider bone survey and possible bone marrow Brewer depending on the results of today's testing.  Thank you for this referral.  Ameka Krigbaum, DNP, AGPCNP-BC, AOCNP  The total time spent in the visit was 60 minutes. Greater than 50%  of this time was spent counseling and coordinating care related to the above assessment and plan.

## 2024-09-22 LAB — IGG, IGA, IGM
IgA: 372 mg/dL (ref 64–422)
IgG (Immunoglobin G), Serum: 1358 mg/dL (ref 586–1602)
IgM (Immunoglobulin M), Srm: 75 mg/dL (ref 26–217)

## 2024-09-23 LAB — BETA 2 MICROGLOBULIN, SERUM: Beta-2 Microglobulin: 2.9 mg/L — ABNORMAL HIGH (ref 0.6–2.4)

## 2024-09-24 LAB — PROTEIN ELECTROPHORESIS, SERUM, WITH REFLEX
A/G Ratio: 1.1 (ref 0.7–1.7)
Albumin ELP: 3.6 g/dL (ref 2.9–4.4)
Alpha-1-Globulin: 0.2 g/dL (ref 0.0–0.4)
Alpha-2-Globulin: 0.8 g/dL (ref 0.4–1.0)
Beta Globulin: 1.3 g/dL (ref 0.7–1.3)
Gamma Globulin: 1.1 g/dL (ref 0.4–1.8)
Globulin, Total: 3.4 g/dL (ref 2.2–3.9)
Total Protein ELP: 7 g/dL (ref 6.0–8.5)

## 2024-09-24 LAB — KAPPA/LAMBDA LIGHT CHAINS
Kappa free light chain: 116 mg/L — ABNORMAL HIGH (ref 3.3–19.4)
Kappa, lambda light chain ratio: 6.11 — ABNORMAL HIGH (ref 0.26–1.65)
Lambda free light chains: 19 mg/L (ref 5.7–26.3)

## 2024-10-01 ENCOUNTER — Encounter: Payer: Self-pay | Admitting: Radiology

## 2024-10-03 ENCOUNTER — Other Ambulatory Visit (HOSPITAL_COMMUNITY)
Admission: RE | Admit: 2024-10-03 | Discharge: 2024-10-03 | Disposition: A | Discharge status: Home / Self Care | Attending: Oncology | Admitting: Oncology

## 2024-10-03 ENCOUNTER — Other Ambulatory Visit: Payer: Self-pay

## 2024-10-03 DIAGNOSIS — R778 Other specified abnormalities of plasma proteins: Secondary | ICD-10-CM | POA: Insufficient documentation

## 2024-10-03 DIAGNOSIS — R7689 Other specified abnormal immunological findings in serum: Secondary | ICD-10-CM | POA: Insufficient documentation

## 2024-10-10 LAB — UPEP/UIFE/LIGHT CHAINS/TP, 24-HR UR
% BETA, Urine: 26.7 %
ALPHA 1 URINE: 3.8 %
Albumin, U: 30.8 %
Alpha 2, Urine: 16.8 %
Free Kappa Lt Chains,Ur: 33.1 mg/L (ref 1.17–86.46)
Free Kappa/Lambda Ratio: 9.14 (ref 1.83–14.26)
Free Lambda Lt Chains,Ur: 3.62 mg/L (ref 0.27–15.21)
GAMMA GLOBULIN URINE: 22 %
Total Protein, Urine-Ur/day: 169 mg/(24.h) — ABNORMAL HIGH (ref 30–150)
Total Protein, Urine: 7.7 mg/dL
Total Volume: 2200

## 2024-10-17 ENCOUNTER — Inpatient Hospital Stay: Admitting: Oncology

## 2024-10-24 ENCOUNTER — Inpatient Hospital Stay: Attending: Oncology | Admitting: Oncology

## 2024-10-24 ENCOUNTER — Other Ambulatory Visit: Payer: Self-pay | Admitting: *Deleted

## 2024-10-24 VITALS — BP 129/80 | HR 66 | Temp 97.3°F | Resp 18 | Ht 65.0 in | Wt 133.0 lb

## 2024-10-24 DIAGNOSIS — E1122 Type 2 diabetes mellitus with diabetic chronic kidney disease: Secondary | ICD-10-CM | POA: Insufficient documentation

## 2024-10-24 DIAGNOSIS — R778 Other specified abnormalities of plasma proteins: Secondary | ICD-10-CM | POA: Diagnosis not present

## 2024-10-24 DIAGNOSIS — D472 Monoclonal gammopathy: Secondary | ICD-10-CM | POA: Insufficient documentation

## 2024-10-24 DIAGNOSIS — R7689 Other specified abnormal immunological findings in serum: Secondary | ICD-10-CM | POA: Diagnosis not present

## 2024-10-24 DIAGNOSIS — N1832 Chronic kidney disease, stage 3b: Secondary | ICD-10-CM | POA: Diagnosis not present

## 2024-10-24 DIAGNOSIS — I129 Hypertensive chronic kidney disease with stage 1 through stage 4 chronic kidney disease, or unspecified chronic kidney disease: Secondary | ICD-10-CM | POA: Insufficient documentation

## 2024-10-24 NOTE — Assessment & Plan Note (Addendum)
 Recently diagnosed with MGUS. Discovered incidentally with nephrology.  Labs from 09/21/2024 showed no evidence of an M spike, mild anemia 11.7 with otherwise unremarkable CBC, normal immunoglobulins with elevated kappa free light chain and elevated kappa lambda light chain ratio.  24-hour urine did not reveal M spike but total protein was elevated at 169.  CMP shows mild CKD with creatinine 1.26.  She denies any bone pain but has not had a bone scan.  Calcium levels are normal. We discussed the CRAB criteria which could be indicative of an underlying plasma cell disorder. He also has chronic kidney disease but also has other contributing factors such as diabetes and hypertension which could also explain this.  She has had imaging performed July 2025 at [facility] due to left hip pain and I reviewed the x-ray report which showed no bone lesions.  Additionally, she had CT imaging and a chest x-ray performed in May 2025 which showed no evidence of bone lesions.  I have asked that she repeat a bone scan prior to her next visit in 6 months.

## 2024-10-24 NOTE — Progress Notes (Signed)
 [facility] OFFICE PROGRESS NOTE  Rakes, Rock HERO, FNP  ASSESSMENT & PLAN:    Assessment & Plan MGUS (monoclonal gammopathy of unknown significance) Recently diagnosed with MGUS. Discovered incidentally with nephrology.  Labs from 09/21/2024 showed no evidence of an M spike, mild anemia 11.7 with otherwise unremarkable CBC, normal immunoglobulins with elevated kappa free light chain and elevated kappa lambda light chain ratio.  24-hour urine did not reveal M spike but total protein was elevated at 169.  CMP shows mild CKD with creatinine 1.26.  She denies any bone pain but has not had a bone scan.  Calcium levels are normal. We discussed the CRAB criteria which could be indicative of an underlying plasma cell disorder. He also has chronic kidney disease but also has other contributing factors such as diabetes and hypertension which could also explain this.  She has had imaging performed July 2025 at [facility] due to left hip pain and I reviewed the x-ray report which showed no bone lesions.  Additionally, she had CT imaging and a chest x-ray performed in May 2025 which showed no evidence of bone lesions.  I have asked that she repeat a bone scan prior to her next visit in 6 months.     No orders of the defined types were placed in this encounter.   INTERVAL HISTORY: Patient returns for follow-up.  Ms. Pothier is a 89 year old female who is seen today for initial evaluation for an elevated M spike and elevated serum light chains.  She has a past medical history significant for stage IIIb chronic kidney disease, diabetes mellitus, hyperlipidemia, hypertension, vitamin D  deficiency.  Patient incidentally noted to have MGUS after workup with nephrology. She had a serum protein electrophoresis performed on 8//2025 which showed normal IgG, IgA, IgM levels.  The M spike was elevated.  Immunofixation showed an IgA monoclonal protein with kappa light chain specificity.  The serum free kappa  light chains were elevated at 111.1, serum free lambda light chains were normal at 22.2, and the serum kappa/lambda ratio was elevated at 5.0.  The creatinine on this same date was elevated at 1.46 with an eGFR of 34.  Calcium level was elevated at 10.4 with a normal albumin of 4.4.  CBC from this date showed a WBC of 7.1, hemoglobin 13.6, MCV 104, platelets 256,000, iron level was elevated at 146 but the remainder of the iron panel was normal.  Hepatitis B and hepatitis C testing were negative.  Vitamin B12 level was 904 and folate level was greater than 20.  Her hemoglobin A1c was elevated at 8.2%.  She had a random urine immunofixation was ordered and the immunofixation pattern appears normal and evidence of a monoclonal protein is not apparent.     Patient presents to the office today accompanied by her daughter.  Overall, she reports that she is feeling well.  Her biggest complaint is of left hip pain which has been ongoing for many years.  She has been evaluated by orthopedics in the past and has been told that her left hip pain is due to osteoarthritis.  She had imaging performed at [facility] which did not show any acute abnormalities.   She currently denies any significant fatigue, loss of appetite, unintentional weight loss, fevers, chills, night sweats.  She is not experiencing any chest discomfort, shortness of breath, abdominal pain, nausea, vomiting, constipation, diarrhea, bleeding.  Today, she is in a wheelchair but normally she uses a walker.  Her daughter states that  her gait is slow but overall she is fairly independent to use of her walker.  We reviewed CBC, CMP, SPEP, IFE, kappa lambda light chains, LDH and 24-hour urine.  SUMMARY OF HEMATOLOGIC HISTORY: Oncology History   No history exists.     CBC    Component Value Date/Time   WBC 8.7 09/21/2024 1056   RBC 3.56 (L) 09/21/2024 1056   HGB 11.7 (L) 09/21/2024 1056   HGB 12.0 08/08/2024 1042   HCT 35.8 (L) 09/21/2024 1056   HCT  37.7 08/08/2024 1042   PLT 234 09/21/2024 1056   PLT 222 08/08/2024 1042   MCV 100.6 (H) 09/21/2024 1056   MCV 102 (H) 08/08/2024 1042   MCH 32.9 09/21/2024 1056   MCHC 32.7 09/21/2024 1056   RDW 13.3 09/21/2024 1056   RDW 12.2 08/08/2024 1042   LYMPHSABS 1.5 09/21/2024 1056   LYMPHSABS 1.5 08/08/2024 1042   MONOABS 0.4 09/21/2024 1056   EOSABS 0.1 09/21/2024 1056   EOSABS 0.1 08/08/2024 1042   BASOSABS 0.0 09/21/2024 1056   BASOSABS 0.0 08/08/2024 1042       Latest Ref Rng & Units 09/21/2024   10:56 AM 08/08/2024   10:42 AM 05/02/2024    9:36 AM  CMP  Glucose 70 - 99 mg/dL 785  832  734   BUN 8 - 23 mg/dL 45  44  57   Creatinine 0.44 - 1.00 mg/dL 8.73  8.53  8.49   Sodium 135 - 145 mmol/L 135  140  135   Potassium 3.5 - 5.1 mmol/L 4.2  4.6  4.4   Chloride 98 - 111 mmol/L 99  101  95   CO2 22 - 32 mmol/L 26  23  20    Calcium 8.9 - 10.3 mg/dL 9.9  89.9  9.9   Total Protein 6.5 - 8.1 g/dL 7.7  6.7    Total Bilirubin 0.0 - 1.2 mg/dL 0.5  0.4    Alkaline Phos 38 - 126 U/L 114  109    AST 15 - 41 U/L 24  19    ALT 0 - 44 U/L 22  18       Lab Results  Component Value Date   FERRITIN 72 07/21/2023   VITAMINB12 618 10/21/2023    Vitals:   10/24/24 1026  BP: (!) 151/70  Pulse: 66  Resp: 18  Temp: (!) 97.3 F (36.3 C)  SpO2: 100%    Review of System:  Review of Systems  Constitutional:  Positive for malaise/fatigue.  Cardiovascular:  Positive for leg swelling.  Neurological:  Positive for dizziness and weakness.    Physical Exam: Physical Exam Constitutional:      Appearance: Normal appearance.  HENT:     Head: Normocephalic and atraumatic.  Eyes:     Pupils: Pupils are equal, round, and reactive to light.  Cardiovascular:     Rate and Rhythm: Normal rate and regular rhythm.     Heart sounds: Normal heart sounds. No murmur heard. Pulmonary:     Effort: Pulmonary effort is normal.     Breath sounds: Normal breath sounds. No wheezing.  Abdominal:      General: Bowel sounds are normal. There is no distension.     Palpations: Abdomen is soft.     Tenderness: There is no abdominal tenderness.  Musculoskeletal:        General: Normal range of motion.     Cervical back: Normal range of motion.  Skin:    General: Skin  is warm and dry.     Findings: No rash.  Neurological:     Mental Status: She is alert and oriented to person, place, and time.     Gait: Gait is intact.  Psychiatric:        Mood and Affect: Mood and affect normal.        Cognition and Memory: Memory normal.        Judgment: Judgment normal.      I spent 25 minutes dedicated to the care of this patient (face-to-face and non-face-to-face) on the date of the encounter to include what is described in the assessment and plan.,  Delon Hope, NP 10/24/2024 10:32 AM

## 2024-11-07 ENCOUNTER — Ambulatory Visit: Admitting: Family Medicine

## 2024-11-07 ENCOUNTER — Ambulatory Visit: Payer: Self-pay | Admitting: Family Medicine

## 2024-11-07 ENCOUNTER — Encounter: Payer: Self-pay | Admitting: Family Medicine

## 2024-11-07 VITALS — BP 129/65 | HR 83 | Temp 97.6°F | Ht 65.0 in | Wt 134.6 lb

## 2024-11-07 DIAGNOSIS — N183 Chronic kidney disease, stage 3 unspecified: Secondary | ICD-10-CM | POA: Diagnosis not present

## 2024-11-07 DIAGNOSIS — E1122 Type 2 diabetes mellitus with diabetic chronic kidney disease: Secondary | ICD-10-CM | POA: Diagnosis not present

## 2024-11-07 DIAGNOSIS — D472 Monoclonal gammopathy: Secondary | ICD-10-CM | POA: Diagnosis not present

## 2024-11-07 DIAGNOSIS — E1159 Type 2 diabetes mellitus with other circulatory complications: Secondary | ICD-10-CM | POA: Diagnosis not present

## 2024-11-07 DIAGNOSIS — E1169 Type 2 diabetes mellitus with other specified complication: Secondary | ICD-10-CM | POA: Diagnosis not present

## 2024-11-07 DIAGNOSIS — I152 Hypertension secondary to endocrine disorders: Secondary | ICD-10-CM | POA: Diagnosis not present

## 2024-11-07 DIAGNOSIS — E785 Hyperlipidemia, unspecified: Secondary | ICD-10-CM

## 2024-11-07 LAB — BAYER DCA HB A1C WAIVED: HB A1C (BAYER DCA - WAIVED): 7.7 % — ABNORMAL HIGH (ref 4.8–5.6)

## 2024-11-07 NOTE — Progress Notes (Signed)
 Subjective:  Patient ID: Candice Brewer English, female    DOB: September 06, 1932, 89 y.o.   MRN: [id number]  Patient Care Team: Severa Rock HERO, FNP as PCP - General (Family Medicine) Inocencio Soyla Lunger, MD as PCP - Electrophysiology (Cardiology)   Chief Complaint:  Medical Management of Chronic Issues   HPI: Candice Brewer is a 89 y.o. female presenting on 11/07/2024 for Medical Management of Chronic Issues   Candice Brewer is a 89 year old female with diabetes and hypertension who presents for a routine follow-up visit.  She recently underwent a procedure to remove calcium from her ears and has a follow-up appointment scheduled for tomorrow. No new issues have arisen, and everything is about the same as before.  Regarding her diabetes, she is not on any antidiabetic medications and manages her condition with a multivitamin. She has not been checking her blood sugars at home. No increased hunger, thirst, or urination. Her last A1c was 7.7%.  For hypertension, she continues to take hydrochlorothiazide  daily.  She has a history of foot issues related to diabetes, with previous slow healing of her toes. Her left foot tends to swell daily, especially with prolonged standing. No pain in the heel or redness in the toes. She has not been using compression socks.  She continues to take vitamin D  supplements and uses eye drops, including a new one added after recent eye surgery. She has an upcoming nephrology appointment. She mentions difficulty finding a provider for special shoe fittings due to Medicare coverage issues.          Relevant past medical, surgical, family, and social history reviewed and updated as indicated.  Allergies and medications reviewed and updated. Data reviewed: Chart in Epic.   Past Medical History:  Diagnosis Date   Arthritis    Chest pain, unspecified    Chronic kidney disease    Constipation    DM (diabetes mellitus) (HCC)    Dry eyes    Esophageal stricture  09/2011   schatzki's ring   Fibroids    uterus   Glaucoma    Helicobacter pylori gastritis 2012   Treated   HTN (hypertension)    Hyperlipidemia    Restless legs syndrome (RLS)    Vitamin D  deficiency     Past Surgical History:  Procedure Laterality Date   APPENDECTOMY     BREAST BIOPSY Left 03/20/2018   Procedure: BREAST BIOPSY WITH NEEDLE LOCALIZATION;  Surgeon: Kallie Manuelita BROCKS, MD;  Location: [facility];  Service: General;  Laterality: Left;   BREAST EXCISIONAL BIOPSY Left 2019   Atypical hyperplasia removed   CARDIOVASCULAR STRESS TEST  2009   CATARACT EXTRACTION  2010   right eye   CATARACT EXTRACTION  2012   left eye   COLONOSCOPY  08/2006   Dr. Carleene   COLONOSCOPY  10/13/2011   Normal. Next TCS 09/2016.   Procedure: COLONOSCOPY;  Surgeon: Lamar HERO Hollingshead, MD;  Location: [facility];  Service: Endoscopy;  Laterality: N/A;  8:15   COLONOSCOPY N/A 03/10/2015   RMR: External and anal canal hemorrhoids likely source of hematochezia. Redundant colon. Single sigmoid polyp removed ad described above.    ESOPHAGOGASTRODUODENOSCOPY  06/2008   Dr. Belen ring s/p disruption, erosive reflux esophagitis, hh.    ESOPHAGOGASTRODUODENOSCOPY  09/2011   Schatki's ring s/p dilation, multiple 1-35mm antral and bulbar erosions, bx positive for H.Pylori s/ treatment   TUBAL LIGATION      Social History   Socioeconomic  History   Marital status: Divorced    Spouse name: Not on file   Number of children: 5   Years of education: Not on file   Highest education level: Never attended school  Occupational History   Occupation: retired psychologist, occupational  Tobacco Use   Smoking status: Never   Smokeless tobacco: Never   Tobacco comments:    quit about 35+ yrs  Vaping Use   Vaping status: Never Used  Substance and Sexual Activity   Alcohol  use: No    Alcohol /week: 0.0 standard drinks of alcohol    Drug use: No   Sexual activity: Not Currently  Other Topics Concern   Not on  file  Social History Narrative   Not on file   Social Drivers of Health   Financial Resource Strain: Low Risk  (08/05/2024)   Overall Financial Resource Strain (CARDIA)    Difficulty of Paying Living Expenses: Not very hard  Food Insecurity: No Food Insecurity (08/05/2024)   Hunger Vital Sign    Worried About Running Out of Food in the Last Year: Never true    Ran Out of Food in the Last Year: Never true  Transportation Needs: No Transportation Needs (08/05/2024)   PRAPARE - Administrator, Civil Service (Medical): No    Lack of Transportation (Non-Medical): No  Physical Activity: Insufficiently Active (08/05/2024)   Exercise Vital Sign    Days of Exercise per Week: 5 days    Minutes of Exercise per Session: 10 min  Stress: No Stress Concern Present (08/05/2024)   [employer] of Occupational Health - Occupational Stress Questionnaire    Feeling of Stress: Not at all  Social Connections: Moderately Integrated (08/05/2024)   Social Connection and Isolation Panel    Frequency of Communication with Friends and Family: More than three times a week    Frequency of Social Gatherings with Friends and Family: More than three times a week    Attends Religious Services: More than 4 times per year    Active Member of [employer] or Organizations: Yes    Attends Engineer, Structural: More than 4 times per year    Marital Status: Divorced  Intimate Partner Violence: Not At Risk (04/27/2024)   Humiliation, Afraid, Rape, and Kick questionnaire    Fear of Current or Ex-Partner: No    Emotionally Abused: No    Physically Abused: No    Sexually Abused: No    Outpatient Encounter Medications as of 11/07/2024  Medication Sig   aspirin  EC 81 MG tablet Take 81 mg by mouth daily.   brimonidine -timolol  (COMBIGAN ) 0.2-0.5 % ophthalmic solution Place 1 drop into both eyes 2 (two) times daily.    cholecalciferol (VITAMIN D ) 1000 units tablet Take 1,000 Units by mouth daily.    hydrochlorothiazide  (HYDRODIURIL ) 25 MG tablet Take 1 tablet (25 mg total) by mouth daily.   hypromellose (SYSTANE NIGHT) 0.3 % GEL ophthalmic ointment Place 1 Application into both eyes at bedtime.   loteprednol (LOTEMAX) 0.5 % ophthalmic suspension Place 1 drop into both eyes 2 (two) times a week.   Multiple Vitamin (MULTIVITAMIN WITH MINERALS) TABS tablet Take 1 tablet by mouth daily.   OVER THE COUNTER MEDICATION Apply 1 Application topically as needed (pain). Amish gel   Polyethyl Glycol-Propyl Glycol (SYSTANE) 0.4-0.3 % SOLN Place 1 drop into both eyes as needed (dry eyes).   valACYclovir  (VALTREX ) 500 MG tablet Take 500 mg by mouth daily.   VYZULTA  0.024 % SOLN Apply 1 drop to  eye at bedtime.   No facility-administered encounter medications on file as of 11/07/2024.    Allergies  Allergen Reactions   Pravastatin Sodium Hives and Other (See Comments)    Funny feeling and leg pain    REACTION: Funny feeling and leg pain    REACTION: Funny feeling and leg pain REACTION: Funny feeling and leg pain REACTION: Funny feeling and leg pain   Benzalkonium Other (See Comments)    Unknown    Benzalkonium Chloride Other (See Comments)    Unknown   Metformin And Related Other (See Comments)    Contraindicated due to chronic kidney disease GFR 30s   Neomy-Bacit-Polymyx-Pramoxine Rash   Neomycin-Bacitracin Zn-Polymyx Rash    Severe skin rash   Rosuvastatin Rash    Pertinent ROS per HPI, otherwise unremarkable      Objective:  BP 129/65   Pulse 83   Temp 97.6 F (36.4 C)   Ht 5' 5 (1.651 m)   Wt 134 lb 9.6 oz (61.1 kg)   SpO2 98%   BMI 22.40 kg/m    Wt Readings from Last 3 Encounters:  11/07/24 134 lb 9.6 oz (61.1 kg)  10/24/24 133 lb (60.3 kg)  09/21/24 131 lb 6.3 oz (59.6 kg)    Physical Exam Vitals and nursing note reviewed.  Constitutional:      General: She is not in acute distress.    Appearance: Normal appearance. She is not ill-appearing,  toxic-appearing or diaphoretic.  HENT:     Head: Normocephalic and atraumatic.     Nose: Nose normal.     Mouth/Throat:     Mouth: Mucous membranes are moist.  Eyes:     Comments: Wearing dark glasses due to recent eye surgery  Cardiovascular:     Rate and Rhythm: Normal rate and regular rhythm.     Pulses:          Dorsalis pedis pulses are 2+ on the left side.       Posterior tibial pulses are 2+ on the left side.     Heart sounds: Normal heart sounds.  Pulmonary:     Effort: Pulmonary effort is normal.     Breath sounds: Normal breath sounds.  Musculoskeletal:     Cervical back: Neck supple.     Left lower leg: Edema (minimal to ankle) present.     Left foot: Normal range of motion.       Feet:  Feet:     Left foot:     Protective Sensation: 10 sites tested.  10 sites sensed.     Skin integrity: Skin integrity normal.     Toenail Condition: Left toenails are normal.  Skin:    General: Skin is warm and dry.     Capillary Refill: Capillary refill takes less than 2 seconds.  Neurological:     General: No focal deficit present.     Mental Status: She is alert and oriented to person, place, and time.  Psychiatric:        Mood and Affect: Mood normal.        Behavior: Behavior normal.        Thought Content: Thought content normal.        Judgment: Judgment normal.       Results for orders placed or performed in visit on 10/03/24  24 hr Ur UPEP/UIFE/Light Chains/TP   Collection Time: 10/03/24  9:40 AM  Result Value Ref Range   Total Protein, Urine 7.7 Not Estab. mg/dL  Total Protein, Urine-Ur/day 169 (H) 30 - 150 mg/24 hr   Albumin, U 30.8 %   ALPHA 1 URINE 3.8 %   Alpha 2, Urine 16.8 %   % BETA, Urine 26.7 %   GAMMA GLOBULIN URINE 22.0 %   Free Kappa Lt Chains,Ur 33.10 1.17 - 86.46 mg/L   Free Lambda Lt Chains,Ur 3.62 0.27 - 15.21 mg/L   Free Kappa/Lambda Ratio 9.14 1.83 - 14.26   Immunofixation Result, Urine Comment    Total Volume 2,200    M-SPIKE %, Urine  Not Observed Not Observed %   Note: Comment        Pertinent labs & imaging results that were available during my care of the patient were reviewed by me and considered in my medical decision making.  Assessment & Plan:  Georgian was seen today for medical management of chronic issues.  Diagnoses and all orders for this visit:  Type 2 diabetes mellitus with other specified complication, without long-term current use of insulin  (HCC) -     Bayer DCA Hb A1c Waived  Hyperlipidemia associated with type 2 diabetes mellitus (HCC) Not taking statin therapy.   Hypertension associated with diabetes (HCC) Well controlled on HCTZ  CKD stage 3 due to type 2 diabetes mellitus (HCC) Has follow up with nephrology this week.   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 A1c remains at 7.7, indicating stable glycemic control. No increased hunger, thirst, or urination. No changes in vision other than post-surgical effects. Swelling in the left foot and ankles persists, likely related to diabetes and fluid retention. No pain in the left foot. Continues on hydrochlorothiazide  for fluid management. No antidiabetic medications due to preference, relying on diet and supplements. - Continue hydrochlorothiazide  for fluid management. - Continue follow up with nephrologist for kidney function monitoring. - Will schedule annual labs including cholesterol and A1c at next visit.  MGUS (monoclonal gammopathy of unknown significance) MGUS is well-managed with no alarming findings from recent specialist evaluation. Continues to monitor for any new developments. - Continue monitoring MGUS with specialist follow-up as needed.  General Health Maintenance Continues on vitamin D  supplementation and multivitamin. No new health  maintenance issues discussed. - Continue vitamin D  supplementation and multivitamin.          Continue all other maintenance medications.  Follow up plan: Return in about 3 months (around 02/05/2025), or if symptoms worsen or fail to improve, for Annual Physical.   Continue healthy lifestyle choices, including diet (rich in fruits, vegetables, and lean proteins, and low in salt and simple carbohydrates) and exercise (at least 30 minutes of moderate physical activity daily).  Educational handout given for DM  The above assessment and management plan was discussed with the patient. The patient verbalized understanding of and has agreed to the management plan. Patient is aware to call the clinic if they develop any new symptoms or if symptoms persist or worsen. Patient is aware when to return to the clinic for a follow-up visit. Patient educated on when it is appropriate to go to the emergency department.   Rosaline Bruns, FNP-C [facility] [phone]

## 2024-11-07 NOTE — Patient Instructions (Addendum)
 Continue to monitor your blood sugars as we discussed and record them. Bring the log to your next appointment.  Take your medications as directed.    Goal Blood glucose:    Fasting (before meals) = 80 to 130   Within 2 hours of eating = less than 180   nderstanding your Hemoglobin A1c: 7.7     Diabetes Mellitus and Nutrition    I think that you would greatly benefit from seeing a nutritionist. If this is something you are interested in, please call Dr Wonda at [phone] to schedule an appointment.   When you have diabetes (diabetes mellitus), it is very important to have healthy eating habits because your blood sugar (glucose) levels are greatly affected by what you eat and drink. Eating healthy foods in the appropriate amounts, at about the same times every day, can help you: Control your blood glucose. Lower your risk of heart disease. Improve your blood pressure. Reach or maintain a healthy weight.  Every person with diabetes is different, and each person has different needs for a meal plan. Your health care provider may recommend that you work with a diet and nutrition specialist (dietitian) to make a meal plan that is best for you. Your meal plan may vary depending on factors such as: The calories you need. The medicines you take. Your weight. Your blood glucose, blood pressure, and cholesterol levels. Your activity level. Other health conditions you have, such as heart or kidney disease.  How do carbohydrates affect me? Carbohydrates affect your blood glucose level more than any other type of food. Eating carbohydrates naturally increases the amount of glucose in your blood. Carbohydrate counting is a method for keeping track of how many carbohydrates you eat. Counting carbohydrates is important to keep your blood glucose at a healthy level, especially if you use insulin  or take certain oral diabetes medicines. It is important to know how many carbohydrates you can safely  have in each meal. This is different for every person. Your dietitian can help you calculate how many carbohydrates you should have at each meal and for snack. Foods that contain carbohydrates include: Bread, cereal, rice, pasta, and crackers. Potatoes and corn. Peas, beans, and lentils. Milk and yogurt. Fruit and juice. Desserts, such as cakes, cookies, ice cream, and candy.  How does alcohol  affect me? Alcohol  can cause a sudden decrease in blood glucose (hypoglycemia), especially if you use insulin  or take certain oral diabetes medicines. Hypoglycemia can be a life-threatening condition. Symptoms of hypoglycemia (sleepiness, dizziness, and confusion) are similar to symptoms of having too much alcohol . If your health care provider says that alcohol  is safe for you, follow these guidelines: Limit alcohol  intake to no more than 1 drink per day for nonpregnant women and 2 drinks per day for men. One drink equals 12 oz of beer, 5 oz of wine, or 1 oz of hard liquor. Do not drink on an empty stomach. Keep yourself hydrated with water , diet soda, or unsweetened iced tea. Keep in mind that regular soda, juice, and other mixers may contain a lot of sugar and must be counted as carbohydrates.  What are tips for following this plan?  Reading food labels Start by checking the serving size on the label. The amount of calories, carbohydrates, fats, and other nutrients listed on the label are based on one serving of the food. Many foods contain more than one serving per package. Check the total grams (g) of carbohydrates in one serving. You can calculate  the number of servings of carbohydrates in one serving by dividing the total carbohydrates by 15. For example, if a food has 30 g of total carbohydrates, it would be equal to 2 servings of carbohydrates. Check the number of grams (g) of saturated and trans fats in one serving. Choose foods that have low or no amount of these fats. Check the number of  milligrams (mg) of sodium in one serving. Most people should limit total sodium intake to less than 2,300 mg per day. Always check the nutrition information of foods labeled as low-fat or nonfat. These foods may be higher in added sugar or refined carbohydrates and should be avoided. Talk to your dietitian to identify your daily goals for nutrients listed on the label.  Shopping Avoid buying canned, premade, or processed foods. These foods tend to be high in fat, sodium, and added sugar. Shop around the outside edge of the grocery store. This includes fresh fruits and vegetables, bulk grains, fresh meats, and fresh dairy.  Cooking Use low-heat cooking methods, such as baking, instead of high-heat cooking methods like deep frying. Cook using healthy oils, such as olive, canola, or sunflower oil. Avoid cooking with butter, cream, or high-fat meats.  Meal planning Eat meals and snacks regularly, preferably at the same times every day. Avoid going long periods of time without eating. Eat foods high in fiber, such as fresh fruits, vegetables, beans, and whole grains. Talk to your dietitian about how many servings of carbohydrates you can eat at each meal. Eat 4-6 ounces of lean protein each day, such as lean meat, chicken, fish, eggs, or tofu. 1 ounce is equal to 1 ounce of meat, chicken, or fish, 1 egg, or 1/4 cup of tofu. Eat some foods each day that contain healthy fats, such as avocado, nuts, seeds, and fish.  Lifestyle  Check your blood glucose regularly. Exercise at least 30 minutes 5 or more days each week, or as told by your health care provider. Take medicines as told by your health care provider. Do not use any products that contain nicotine or tobacco, such as cigarettes and e-cigarettes. If you need help quitting, ask your health care provider. Work with a veterinary surgeon or diabetes educator to identify strategies to manage stress and any emotional and social challenges.  What are  some questions to ask my health care provider? Do I need to meet with a diabetes educator? Do I need to meet with a dietitian? What number can I call if I have questions? When are the best times to check my blood glucose?  Where to find more information: American Diabetes Association: diabetes.org/food-and-fitness/food Academy of Nutrition and Dietetics: https://www.vargas.com/ [employer] of Diabetes and Digestive and Kidney Diseases (NIH): findjewelers.cz  Summary A healthy meal plan will help you control your blood glucose and maintain a healthy lifestyle. Working with a diet and nutrition specialist (dietitian) can help you make a meal plan that is best for you. Keep in mind that carbohydrates and alcohol  have immediate effects on your blood glucose levels. It is important to count carbohydrates and to use alcohol  carefully. This information is not intended to replace advice given to you by your health care provider. Make sure you discuss any questions you have with your health care provider. Document Released: 08/12/2005 Document Revised: 12/20/2016 Document Reviewed: 12/20/2016 Elsevier Interactive Patient Education  [employer].

## 2024-11-20 ENCOUNTER — Encounter: Payer: Self-pay | Admitting: *Deleted

## 2024-11-26 ENCOUNTER — Other Ambulatory Visit: Payer: Self-pay | Admitting: Family Medicine

## 2024-11-26 ENCOUNTER — Encounter: Payer: Self-pay | Admitting: *Deleted

## 2025-01-04 ENCOUNTER — Encounter: Payer: Self-pay | Admitting: *Deleted

## 2025-02-20 ENCOUNTER — Encounter: Admitting: Family Medicine

## 2025-02-28 ENCOUNTER — Encounter: Admitting: Family Medicine

## 2025-03-05 ENCOUNTER — Encounter: Admitting: Family Medicine

## 2025-04-16 ENCOUNTER — Other Ambulatory Visit (HOSPITAL_COMMUNITY)

## 2025-04-16 ENCOUNTER — Inpatient Hospital Stay

## 2025-04-23 ENCOUNTER — Inpatient Hospital Stay: Admitting: Oncology
